# Patient Record
Sex: Female | Born: 1950
Health system: Southern US, Community
[De-identification: ages and names within clinical notes are randomized; demographics above are authoritative.]

## PROBLEM LIST (undated history)

## (undated) DIAGNOSIS — I10 Essential (primary) hypertension: Secondary | ICD-10-CM

## (undated) DIAGNOSIS — G4730 Sleep apnea, unspecified: Secondary | ICD-10-CM

## (undated) DIAGNOSIS — F419 Anxiety disorder, unspecified: Secondary | ICD-10-CM

## (undated) DIAGNOSIS — E785 Hyperlipidemia, unspecified: Secondary | ICD-10-CM

## (undated) HISTORY — DX: Anxiety disorder, unspecified: F41.9

## (undated) HISTORY — DX: Essential (primary) hypertension: I10

## (undated) HISTORY — PX: APPENDECTOMY: SHX54

## (undated) HISTORY — DX: Sleep apnea, unspecified: G47.30

## (undated) HISTORY — DX: Hyperlipidemia, unspecified: E78.5

## (undated) HISTORY — PX: HERNIA REPAIR: SHX51

## (undated) HISTORY — PX: COLON SURGERY: SHX602

## (undated) HISTORY — PX: ABDOMINAL HYSTERECTOMY: SHX81

## (undated) HISTORY — PX: CHOLECYSTECTOMY: SHX55

---

## 1998-02-13 ENCOUNTER — Ambulatory Visit (HOSPITAL_COMMUNITY): Admission: RE | Admit: 1998-02-13 | Discharge: 1998-02-13 | Payer: Self-pay

## 1998-02-15 ENCOUNTER — Ambulatory Visit (HOSPITAL_COMMUNITY): Admission: RE | Admit: 1998-02-15 | Discharge: 1998-02-15 | Payer: Self-pay | Admitting: Family Medicine

## 1998-02-15 ENCOUNTER — Emergency Department (HOSPITAL_COMMUNITY): Admission: EM | Admit: 1998-02-15 | Discharge: 1998-02-15 | Payer: Self-pay | Admitting: Emergency Medicine

## 1998-02-19 ENCOUNTER — Inpatient Hospital Stay (HOSPITAL_COMMUNITY): Admission: EM | Admit: 1998-02-19 | Discharge: 1998-02-21 | Payer: Self-pay | Admitting: Surgery

## 1998-03-08 ENCOUNTER — Ambulatory Visit (HOSPITAL_COMMUNITY): Admission: RE | Admit: 1998-03-08 | Discharge: 1998-03-08 | Payer: Self-pay | Admitting: Surgery

## 1998-03-14 ENCOUNTER — Inpatient Hospital Stay (HOSPITAL_COMMUNITY): Admission: RE | Admit: 1998-03-14 | Discharge: 1998-03-21 | Payer: Self-pay | Admitting: Surgery

## 1998-10-18 ENCOUNTER — Ambulatory Visit (HOSPITAL_COMMUNITY): Admission: RE | Admit: 1998-10-18 | Discharge: 1998-10-18 | Payer: Self-pay | Admitting: Surgery

## 1999-02-12 ENCOUNTER — Emergency Department (HOSPITAL_COMMUNITY): Admission: EM | Admit: 1999-02-12 | Discharge: 1999-02-12 | Payer: Self-pay | Admitting: Emergency Medicine

## 1999-02-14 ENCOUNTER — Encounter: Payer: Self-pay | Admitting: Surgery

## 1999-02-17 ENCOUNTER — Ambulatory Visit (HOSPITAL_COMMUNITY): Admission: RE | Admit: 1999-02-17 | Discharge: 1999-02-17 | Payer: Self-pay | Admitting: Surgery

## 1999-02-20 ENCOUNTER — Inpatient Hospital Stay (HOSPITAL_COMMUNITY): Admission: EM | Admit: 1999-02-20 | Discharge: 1999-02-23 | Payer: Self-pay | Admitting: Surgery

## 2005-08-28 ENCOUNTER — Ambulatory Visit (HOSPITAL_COMMUNITY): Admission: RE | Admit: 2005-08-28 | Discharge: 2005-08-28 | Payer: Self-pay | Admitting: Obstetrics and Gynecology

## 2009-04-29 ENCOUNTER — Encounter: Payer: Self-pay | Admitting: Cardiology

## 2009-05-15 ENCOUNTER — Encounter: Payer: Self-pay | Admitting: Cardiology

## 2009-07-10 ENCOUNTER — Encounter: Payer: Self-pay | Admitting: Cardiology

## 2009-08-06 ENCOUNTER — Ambulatory Visit: Payer: Self-pay | Admitting: Cardiology

## 2009-08-06 DIAGNOSIS — G4730 Sleep apnea, unspecified: Secondary | ICD-10-CM

## 2009-08-06 DIAGNOSIS — I10 Essential (primary) hypertension: Secondary | ICD-10-CM | POA: Insufficient documentation

## 2009-08-06 DIAGNOSIS — R9430 Abnormal result of cardiovascular function study, unspecified: Secondary | ICD-10-CM | POA: Insufficient documentation

## 2009-08-16 ENCOUNTER — Ambulatory Visit (HOSPITAL_BASED_OUTPATIENT_CLINIC_OR_DEPARTMENT_OTHER): Admission: RE | Admit: 2009-08-16 | Discharge: 2009-08-16 | Payer: Self-pay | Admitting: Cardiology

## 2009-08-16 ENCOUNTER — Encounter: Payer: Self-pay | Admitting: Cardiology

## 2009-08-19 ENCOUNTER — Telehealth (INDEPENDENT_AMBULATORY_CARE_PROVIDER_SITE_OTHER): Payer: Self-pay | Admitting: *Deleted

## 2009-08-20 ENCOUNTER — Ambulatory Visit: Payer: Self-pay

## 2009-08-20 ENCOUNTER — Encounter (HOSPITAL_COMMUNITY): Admission: RE | Admit: 2009-08-20 | Discharge: 2009-11-11 | Payer: Self-pay | Admitting: Cardiology

## 2009-08-22 ENCOUNTER — Ambulatory Visit: Payer: Self-pay | Admitting: Pulmonary Disease

## 2009-08-26 ENCOUNTER — Ambulatory Visit: Payer: Self-pay

## 2009-08-26 ENCOUNTER — Ambulatory Visit: Payer: Self-pay | Admitting: Internal Medicine

## 2009-09-10 ENCOUNTER — Telehealth: Payer: Self-pay | Admitting: Cardiology

## 2009-09-30 ENCOUNTER — Ambulatory Visit: Payer: Self-pay | Admitting: Pulmonary Disease

## 2009-10-23 ENCOUNTER — Encounter: Payer: Self-pay | Admitting: Pulmonary Disease

## 2009-11-12 ENCOUNTER — Ambulatory Visit: Payer: Self-pay | Admitting: Pulmonary Disease

## 2009-11-13 ENCOUNTER — Encounter: Payer: Self-pay | Admitting: Pulmonary Disease

## 2010-10-09 NOTE — Assessment & Plan Note (Signed)
Summary: rov 6 wks ///kp   Visit Type:  Follow-up Copy to:  Dr. Antoine Poche Primary Provider/Referring Provider:  Dr. Vernon Prey  CC:  Pt here for follow up. Pt states has problems adjusting to CPAP.  Pt c/o dry mouth, hacking cough with CPAP. Pt states snoring is not as loud as before, and is feeling rested whan waking up in the AM's, and and is using same approx 4 hours a night.  History of Present Illness: 60/F , retired Comptroller, referred for evaluation of obstructive sleep apnea . Epworth Sleepiness Score 12/24 mother reports loud  snoring. Bedtime 10-11 pm, no latency, sleeps on her side with 2 pillows frequent arousals 5-10, light sleeper, BR 1-2 Wakes up at 6A, sometimes not rested, dry mouth, no headaches,no  coffee, not much tea,  Enrolled in weight watchers, lost 30 lbs since 9/10  Overnight split PSG 08/16/09 showed moderate sleep disorderde breathing with AHI 16/h & nadir desatn to 88%, worse in supine position, predom hypopneas,  corrected by CPAP 10 cm , titrated to 14 cm for snoring.  November 12, 2009 2:47 PM  Had bronchitis x 3 weeks. wt 313 Pt states has problems adjusting to CPAP.  Pt c/o dry mouth, hacking cough with CPAP. Pt states snoring is not as loud as before, and is feeling rested whan waking up in the AM's, and and is using same approx 4 hours a night. Mother noted no snoring. Dry mouth inspite of a humidifer.  Current Medications (verified): 1)  Vitamin D3 2000 Unit Caps (Cholecalciferol) .Rhonda Daniel.. 1 Tab By Mouth Once Daily 2)  Simcor 1000-40 Mg Xr24h-Tab (Niacin-Simvastatin) .... Take 1 Tab By Mouth At Bedtime 3)  Lisinopril-Hydrochlorothiazide 20-12.5 Mg Tabs (Lisinopril-Hydrochlorothiazide) .Rhonda Daniel.. 1 Tab By Mouth Once Daily 4)  Aspirin 81 Mg  Tabs (Aspirin) .... Take 1 Tablet By Mouth Once A Day  Allergies (verified): No Known Drug Allergies  Past History:  Past Medical History: Last updated: 08/06/2009 HTN (recent) Dyslipidemia (Meds x several years)  Social  History: Last updated: 09/30/2009 The patient is to retire Chartered loss adjuster. She is not married and has no children. She has never smoked cigarettes and doesn't drink alcohol. lives with mother  Review of Systems  The patient denies anorexia, fever, weight loss, weight gain, vision loss, decreased hearing, hoarseness, chest pain, syncope, dyspnea on exertion, peripheral edema, prolonged cough, headaches, hemoptysis, abdominal pain, melena, hematochezia, severe indigestion/heartburn, hematuria, muscle weakness, difficulty walking, depression, unusual weight change, and abnormal bleeding.    Vital Signs:  Patient profile:   60 year old female Height:      65 inches Weight:      313.13 pounds O2 Sat:      96 % on Room air Temp:     97.4 degrees F oral Pulse rate:   54 / minute BP sitting:   122 / 78  (right arm) Cuff size:   large  Vitals Entered By: Zackery Barefoot CMA (November 12, 2009 2:32 PM)  O2 Flow:  Room air CC: Pt here for follow up. Pt states has problems adjusting to CPAP.  Pt c/o dry mouth, hacking cough with CPAP. Pt states snoring is not as loud as before, and is feeling rested whan waking up in the AM's, and is using same approx 4 hours a night Comments Medications reviewed with patient Verified contact number and pharmacy with patient Zackery Barefoot CMA  November 12, 2009 2:35 PM    Physical Exam  Additional Exam:  Gen. Pleasant,  well-nourished, in no distress, normal affect ENT - no lesions, no post nasal drip, class 3 aiway Neck: No JVD, no thyromegaly, no carotid bruits Lungs: no use of accessory muscles, no dullness to percussion, clear without rales or rhonchi  Cardiovascular: Rhythm regular, heart sounds  normal, no murmurs or gallops, no peripheral edema       Impression & Recommendations:  Problem # 1:  SLEEP APNEA (ICD-780.57) ct CPAP 10 cm Compliance encouraged, wt loss emphasized, asked to avoid meds with sedative side effects, cautioned against driving  when sleepy.  Await download  Medications Added to Medication List This Visit: 1)  Simcor 1000-40 Mg Xr24h-tab (Niacin-simvastatin) .... Take 1 tab by mouth at bedtime 2)  Aspirin 81 Mg Tabs (Aspirin) .... Take 1 tablet by mouth once a day 3)  Cpap 10 Cm   Patient Instructions: 1)  Copy sent to: 2)  Please schedule a follow-up appointment in 4 months. 3)  Keep using your machine 4-5 hrs/ night 4)  Send in the chip so we can look at the download    Appended Document: rov 6 wks ///kp reviewed download 1/28- 3/9 >> 50% compliance, 20/21 days, avg AHI 8/h, pr 10 cm Let her know - she needs to increase usage to at least 4h EVERY night  Appended Document: rov 6 wks ///kp Pt informed of RA recs. Pt states she has another head cold but will try to use as directed.  Appended Document: rov 6 wks ///kp     Allergies: No Known Drug Allergies   Other Orders: Est. Patient Level III (53664)

## 2010-10-09 NOTE — Assessment & Plan Note (Signed)
Summary: sleep apnea/apc   Visit Type:  Initial Consult Copy to:  Dr. Antoine Poche Primary Provider/Referring Provider:  Dr. 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 Epworth Sleepiness Score 12/24 mother reports loud  snoring. Bedtime 10-11 pm, no latency, sleeps on her side with 2 pillows frequent arousals 5-10, light sleeper, BR 1-2 Wakes up at 6A, sometimes not rested, dry mouth, no headaches,no  coffee, not much tea,  Enrolled in weight watchers, lost 30 lbs since 9/10  There is no history suggestive of cataplexy, sleep paralysis or parasomnias  Overnight split PSG 08/16/09 showed moderate sleep disorderde breathing with AHI 16/h & nadir desatn to 88%, worse in supine position, predom hypopneas,  corrected by CPAP 10 cm , titrated to 14 cm for snoring.  Preventive Screening-Counseling & Management  Alcohol-Tobacco     Smoking Status: never   History of Present Illness: Snoring, getting up during night, tiredness, irregular heart rhythm, overweight  What time do you typically go to bed?(between what hours): 10:00-11:00  How long does it take you to fall asleep? 5-10 minutes  How many times during the night do you wake up? 5-10 times  What time do you get out of bed to start your day? 5:00-6:00  Do you drive or operate heavy machinery in your occupation? no   Have you ever had a sleep study before?  If yes,when and where: 08-16-2009  Do you currently use CPAP ? If so , at what pressure? no  Do you wear oxygen at any time? If yes, how many liters per minute? no Current Medications (verified): 1)  Vitamin D3 2000 Unit Caps (Cholecalciferol) .Marland Kitchen.. 1 Tab By Mouth Once Daily 2)  Advicor 500-20 Mg Xr24h-Tab (Niacin-Lovastatin) .Marland Kitchen.. 1 Tab By Mouth Once Daily 3)  Lisinopril-Hydrochlorothiazide 20-12.5 Mg Tabs (Lisinopril-Hydrochlorothiazide) .Marland Kitchen.. 1 Tab By Mouth Once  Daily  Allergies (verified): No Known Drug Allergies  Past History:  Past Medical History: Last updated: 08/06/2009 HTN (recent) Dyslipidemia (Meds x several years)  Past Surgical History: Last updated: 08/06/2009 Hysterectomy Appendectomy (with removal of a small part of her colon with an apparent malignancy) Cholecystectomy Herniorrhaphy  Family History: Last updated: 09/30/2009 There is no early heart disease in first-degree relatives. Family History Diabetes-mother Family History Hypertension-mother  Social History: Last updated: 09/30/2009 The patient is to retire Chartered loss adjuster. She is not married and has no children. She has never smoked cigarettes and doesn't drink alcohol. lives with mother  Family History: There is no early heart disease in first-degree relatives. Family History Diabetes-mother Family History Hypertension-mother  Social History: The patient is to retire Chartered loss adjuster. She is not married and has no children. She has never smoked cigarettes and doesn't drink alcohol. lives with motherSmoking Status:  never  Review of Systems       The patient complains of shortness of breath with activity, non-productive cough, irregular heartbeats, and joint stiffness or pain.  The patient denies shortness of breath at rest, productive cough, coughing up blood, chest pain, acid heartburn, indigestion, loss of appetite, weight change, abdominal pain, difficulty swallowing, sore throat, tooth/dental problems, headaches, nasal congestion/difficulty breathing through nose, sneezing, itching, ear ache, anxiety, depression, hand/feet swelling, rash, change in color of mucus, and fever.    Vital Signs:  Patient profile:   60 year old female Height:      65 inches Weight:      319.50 pounds O2  Sat:      98 % on Room air Temp:     97.4 degrees F oral Pulse rate:   63 / minute BP sitting:   122 / 78  (left arm) Cuff size:   large  Vitals Entered By: Zackery Barefoot CMA (September 30, 2009 2:03 PM)  O2 Flow:  Room air CC: Pt here for sleep consult. Comments Medications reviewed with patient Verified pt's contact number Zackery Barefoot CMA  September 30, 2009 2:04 PM    Physical Exam  Additional Exam:  Gen. Pleasant, well-nourished, in no distress, normal affect ENT - no lesions, no post nasal drip, class 3 aiway Neck: No JVD, no thyromegaly, no carotid bruits Lungs: no use of accessory muscles, no dullness to percussion, clear without rales or rhonchi  Cardiovascular: Rhythm regular, heart sounds  normal, no murmurs or gallops, no peripheral edema Abdomen: soft and non-tender, no hepatosplenomegaly, BS normal. Musculoskeletal: No deformities, no cyanosis or clubbing Neuro:  alert, non focal     Impression & Recommendations:  Problem # 1:  SLEEP APNEA (ICD-780.57) The pathophysiology of obstructive sleep apnea, it's cardiovascular consequences and modes of treatment including CPAP were discussed with the patient in great detail.  Will start CPAP at 10 cm with small full face mask Compliance encouraged, wt loss emphasized, asked to avoid meds with sedative side effects, cautioned against driving when sleepy.  Orders: Est. Patient Level III (95621) Sleep Disorder Referral (Sleep Disorder)  Patient Instructions: 1)  Copy sent to:Dr Hochrein 2)  Please schedule a follow-up appointment in 1 month. 3)  trial of CPAP machine during sleep

## 2010-10-09 NOTE — Letter (Signed)
Summary: CMN for CPAP Supplies/[facility] [employer]  CMN for CPAP Supplies/[employer]   Imported By: Sherian Rein 10/29/2009 08:42:33  _____________________________________________________________________  External Attachment:    Type:   Image     Comment:   External Document

## 2010-10-09 NOTE — Progress Notes (Signed)
Summary: SLEEP STUDY RESULTS ?pulmonary referral  Phone Note Call from Patient Call back at Home Phone [phone]   Caller: Patient Reason for Call: Talk to Nurse Summary of Call: PT REQUEST RESULTS OF SLEEP STUDY Initial call taken by: Migdalia Dk,  September 10, 2009 4:28 PM  Follow-up for Phone Call        referred to pulmonary for abnormal sleep study per Dr Antoine Poche. Follow-up by: Charolotte Capuchin, RN,  September 12, 2009 2:31 PM

## 2011-01-23 NOTE — Op Note (Signed)
NAME:  Daniel, Rhonda                  ACCOUNT NO.:  [id number]   MEDICAL RECORD NO.:  [id number]          PATIENT TYPE:  AMB   LOCATION:  ENDO                         FACILITY:  [facility]   PHYSICIAN:  Sandria Bales. Ezzard Standing, M.D.  DATE OF BIRTH:  1951-04-24   DATE OF PROCEDURE:  08/28/2005  DATE OF DISCHARGE:                                 OPERATIVE REPORT   PREOPERATIVE DIAGNOSES:  History of adenocarcinoma of the appendix.   POSTOPERATIVE DIAGNOSES:  Sigmoid colon diverticulosis, redundant sigmoid  colon, normal anastomosis.   PROCEDURE:  Colonoscopy.   SURGEON:  Sandria Bales. Ezzard Standing, M.D.   ANESTHESIA:  90 mg of Demerol, 8 mg of Versed.   COMPLICATIONS:  None.   INDICATIONS FOR PROCEDURE:  Ms. Blasdell is a 60 year old white female who had  undergone a right hemicolectomy for carcinoma of the appendix in July of  1999. She has had no evidence of recurrent disease, her last colonoscopy was  June of 2000 and she now comes for followup colonoscopy.   The indications and potential complications of the colonoscopy were  explained to the patient. The potential complications include but not  limited to bleeding, perforation and possible incomplete colonoscopy.   DESCRIPTION OF PROCEDURE:  The patient in the left lateral decubitus  position, had an IV in her right hand. I started out with 50 mg of Demerol,  5 mg of Versed but she had an additional 40 mg of Demerol, 3 mg of Versed  for a total of 90 mg of Demerol and 8 mg of Versed.  She was monitored with  pulxe oximetry, EKG, and BP cuff.  She was on nasal O2 during the operation.   I was able to advance the scope until I got through the sigmoid colon. I  could see the lumen of the left colon; however, on advancing the  colonoscope, I got a lot of  bowing of the scope in the sigmoid colon. At  one point actually in the procedure, she got sick and vomited but she was on  her side and she really did not vomit very much. She had more of a feeling  of  nausea.   I then laid her in the supine position and had no more luck advancing her  scope and then I rolled her to her right lateral decubitus position. At this  time, with pressure on the sigmoid colon, I was able to advance the scope  around to the transverse colon to the anastomosis.   This took up the entire colonoscope approximately 130 cm from the anal  verge. Again, I had the entire colonoscope in and could really not advance  any further. I identified the anastomosis at the rigth transverse colon.  The transverse colon and the left colon were unremarkable. The sigmoid colon  had some scattered diverticula and again was very redundant and because of  her size it was somewhat hard to feel until you really had the sigmoid colon  bowed.   However, other than seeing some contusion of the wall in the sigmoid colon  and the  diverticulosis, the sigmoid colon was otherwise unremarkable into  the rectum. I retroflexed the scope and saw some internal hemorrhoids.   So, she had no obvious mucosal lesions, she had a normal anastomosis at the  full colonoscopic length. She had some scattered sigmoid diverticulosis, she  had a redundant sigmoid colon and some internal hemorrhoids. The next  planned colonoscopy would be in 5 years.      Sandria Bales. Ezzard Standing, M.D.  Electronically Signed     DHN/MEDQ  D:  08/28/2005  T:  09/01/2005  Job:  [id number]   cc:   Ernestina Penna, M.D.  Fax: [phone]   Lindaann Pascal, MD  [facility]

## 2013-03-06 ENCOUNTER — Other Ambulatory Visit: Payer: Self-pay

## 2013-03-06 NOTE — Telephone Encounter (Signed)
Pt has not been seen in greater than 1 yr  NTBS and have labs

## 2013-03-06 NOTE — Telephone Encounter (Signed)
Last lipids 6/13 and last seen 02/22/12    [facility]

## 2013-03-14 ENCOUNTER — Other Ambulatory Visit: Payer: Self-pay | Admitting: *Deleted

## 2013-03-14 ENCOUNTER — Ambulatory Visit: Payer: Self-pay | Admitting: Family Medicine

## 2013-03-14 NOTE — Telephone Encounter (Signed)
[facility] pt, last labs 06/13

## 2013-03-14 NOTE — Telephone Encounter (Signed)
Patient NTBS by Dr. Christell Constant

## 2013-04-15 ENCOUNTER — Other Ambulatory Visit: Payer: Self-pay | Admitting: Nurse Practitioner

## 2013-04-15 ENCOUNTER — Other Ambulatory Visit: Payer: Self-pay | Admitting: Family Medicine

## 2013-04-18 NOTE — Telephone Encounter (Signed)
Last seen 6/13  [facility] 

## 2013-04-18 NOTE — Telephone Encounter (Signed)
Lipids done 2/14   [facility]

## 2013-05-15 ENCOUNTER — Other Ambulatory Visit: Payer: Self-pay | Admitting: Family Medicine

## 2013-05-17 NOTE — Telephone Encounter (Signed)
Please call patient and arrange that she comes in for an exam and lab work

## 2013-05-17 NOTE — Telephone Encounter (Signed)
Last seen and last lipids 6/13  [facility]

## 2014-07-09 ENCOUNTER — Telehealth: Payer: Self-pay | Admitting: Family Medicine

## 2014-07-09 NOTE — Telephone Encounter (Signed)
Spoke with patient and appointment scheduled for tomorrow with Christell ConstantMoore at 10:30

## 2014-07-09 NOTE — Telephone Encounter (Signed)
Patient aware will need an appointment to be seen. Has not been seen here for a very long time. [facility] agrees ,NTBS. She is waiting for triage to call with an appointment, ( don't think it has to be today).

## 2014-07-10 ENCOUNTER — Ambulatory Visit (INDEPENDENT_AMBULATORY_CARE_PROVIDER_SITE_OTHER): Payer: BC Managed Care – PPO

## 2014-07-10 ENCOUNTER — Ambulatory Visit (INDEPENDENT_AMBULATORY_CARE_PROVIDER_SITE_OTHER): Payer: BC Managed Care – PPO | Admitting: Family Medicine

## 2014-07-10 ENCOUNTER — Other Ambulatory Visit: Payer: Self-pay | Admitting: Family Medicine

## 2014-07-10 ENCOUNTER — Encounter: Payer: Self-pay | Admitting: Family Medicine

## 2014-07-10 VITALS — BP 165/89 | HR 73 | Temp 97.1°F | Ht 65.0 in | Wt 325.0 lb

## 2014-07-10 DIAGNOSIS — M25559 Pain in unspecified hip: Secondary | ICD-10-CM

## 2014-07-10 DIAGNOSIS — M5136 Other intervertebral disc degeneration, lumbar region: Secondary | ICD-10-CM

## 2014-07-10 DIAGNOSIS — W19XXXA Unspecified fall, initial encounter: Secondary | ICD-10-CM

## 2014-07-10 DIAGNOSIS — M25551 Pain in right hip: Secondary | ICD-10-CM

## 2014-07-10 DIAGNOSIS — S7002XA Contusion of left hip, initial encounter: Secondary | ICD-10-CM

## 2014-07-10 DIAGNOSIS — R0602 Shortness of breath: Secondary | ICD-10-CM

## 2014-07-10 DIAGNOSIS — M25552 Pain in left hip: Secondary | ICD-10-CM

## 2014-07-10 DIAGNOSIS — I10 Essential (primary) hypertension: Secondary | ICD-10-CM

## 2014-07-10 DIAGNOSIS — E785 Hyperlipidemia, unspecified: Secondary | ICD-10-CM

## 2014-07-10 MED ORDER — LISINOPRIL-HYDROCHLOROTHIAZIDE 20-12.5 MG PO TABS: ORAL_TABLET | ORAL | Status: DC

## 2014-07-10 NOTE — Progress Notes (Signed)
Subjective:    Patient ID: Rhonda Daniel, female    DOB: 1951-07-07, 62 y.o.   MRN: [id number]  HPI Patient here today for a fall that happened on the 07/05/14. She is having right side hip pain and back pain.the patient has also stopped taking her cholesterol medicine and her blood pressure medicine.the patient complains of pain in the right lateral hip area and shortness of breath and some nausea.       Patient Active Problem List   Diagnosis Date Noted  . OBESITY, UNSPECIFIED 08/06/2009  . ESSENTIAL HYPERTENSION, BENIGN 08/06/2009  . SLEEP APNEA 08/06/2009  . CARDIOVASCULAR FUNCTION STUDY, ABNORMAL 08/06/2009   Outpatient Encounter Prescriptions as of 07/10/2014  Medication Sig  . lisinopril-hydrochlorothiazide (PRINZIDE,ZESTORETIC) 20-12.5 MG per tablet TAKE 1 TABLET BY MOUTH DAILY  . SIMCOR 1000-40 MG TB24 TAKE 1 TABLET BY MOUTH EVERY DAY    Review of Systems  Constitutional: Negative.   HENT: Negative.   Eyes: Negative.   Respiratory: Negative.   Cardiovascular: Negative.   Gastrointestinal: Negative.   Endocrine: Negative.   Genitourinary: Negative.   Musculoskeletal: Positive for back pain and arthralgias (right hip pain).  Skin: Negative.   Allergic/Immunologic: Negative.   Neurological: Negative.   Hematological: Negative.   Psychiatric/Behavioral: Negative.        Objective:   Physical Exam  Constitutional: She is oriented to person, place, and time. She appears well-developed and well-nourished. She appears distressed.  The patient comes to the visit today with a friend and the patient is in a wheelchair.  HENT:  Right Ear: External ear normal.  Left Ear: External ear normal.  Nose: Nose normal.  Mouth/Throat: Oropharynx is clear and moist.  Eyes: Conjunctivae and EOM are normal. Pupils are equal, round, and reactive to light. Right eye exhibits no discharge. Left eye exhibits no discharge. No scleral icterus.  Neck: Normal range of motion. Neck  supple. No thyromegaly present.  Cardiovascular: Normal rate, regular rhythm and normal heart sounds.   No murmur heard. At 72/m  Pulmonary/Chest: Effort normal and breath sounds normal. No respiratory distress. She has no wheezes. She has no rales. She exhibits no tenderness.  Abdominal: Soft. There is no tenderness.  Morbidly obese and not thoroughly evaluated today  Musculoskeletal: Normal range of motion. She exhibits edema. She exhibits no tenderness.  Tender in the right sacroiliac area. Edema on the left was greater than the right 1+ pitting  Lymphadenopathy:    She has no cervical adenopathy.  Neurological: She is alert and oriented to person, place, and time. No cranial nerve deficit.  Skin: Skin is warm and dry. No rash noted. No erythema. No pallor.  Psychiatric: She has a normal mood and affect. Her behavior is normal. Judgment and thought content normal.  Nursing note and vitals reviewed.  BP 165/89 mmHg  Pulse 73  Temp(Src) 97.1 F (36.2 C) (Oral)  Ht _0  (1.651 m)  Wt 325 lb (147.419 kg)  BMI 54.08 kg/m2 [facility] reading (PRIMARY) by  Dr.Scotti Motter- chest x-ray, LS-spine, and bilateral hips--the chest x-ray shows atherosclerotic changes in the thoracic aorta, degenerative changes in the LS-spine, and degenerative changes in both hips                                         Assessment & Plan:  1. Hip pain, left  2. Hip pain, right  3. Essential  hypertension - Lipid panel - Hepatic function panel - BMP8+EGFR  4. Morbid obesity  5. Hyperlipidemia  6. Degenerative disc disease, lumbar  7. Contusion of left hip, initial encounter   Meds ordered this encounter  Medications  . aspirin 81 MG tablet    Sig: Take 81 mg by mouth daily.  . cholecalciferol (VITAMIN D) 1000 UNITS tablet    Sig: Take 1,000 Units by mouth daily.  Marland Kitchen lisinopril-hydrochlorothiazide (PRINZIDE,ZESTORETIC) 20-12.5 MG per tablet    Sig: TAKE 1 TABLET BY MOUTH DAILY    Dispense:  30 tablet     Refill:  3   Patient Instructions  Use walker at home Use ice over the areas that are hurting 20 minutes 3 or 4 times daily Take extra strength Tylenol as needed for pain If worse call back sooner Restart blood pressure medication We will wait until Friday to discuss cholesterol medicine after looking at lab work that was drawn today You must do better with taking your medication and taking better care of yourself   Arrie Senate MD

## 2014-07-10 NOTE — Patient Instructions (Signed)
Use walker at home Use ice over the areas that are hurting 20 minutes 3 or 4 times daily Take extra strength Tylenol as needed for pain If worse call back sooner Restart blood pressure medication We will wait until Friday to discuss cholesterol medicine after looking at lab work that was drawn today You must do better with taking your medication and taking better care of yourself

## 2014-07-11 LAB — BMP8+EGFR
BUN / CREAT RATIO: 23 (ref 11–26)
BUN: 15 mg/dL (ref 8–27)
CO2: 26 mmol/L (ref 18–29)
CREATININE: 0.64 mg/dL (ref 0.57–1.00)
Calcium: 9 mg/dL (ref 8.7–10.3)
Chloride: 100 mmol/L (ref 97–108)
GFR calc Af Amer: 109 mL/min/{1.73_m2} (ref >59)
GFR calc non Af Amer: 95 mL/min/{1.73_m2} (ref >59)
Glucose: 86 mg/dL (ref 65–99)
Potassium: 3.6 mmol/L (ref 3.5–5.2)
Sodium: 142 mmol/L (ref 134–144)

## 2014-07-11 LAB — LIPID PANEL
CHOLESTEROL TOTAL: 198 mg/dL (ref 100–199)
Chol/HDL Ratio: 4.4 ratio units (ref 0.0–4.4)
HDL: 45 mg/dL (ref >39)
LDL CALC: 134 mg/dL — AB (ref 0–99)
Triglycerides: 93 mg/dL (ref 0–149)
VLDL CHOLESTEROL CAL: 19 mg/dL (ref 5–40)

## 2014-07-11 LAB — HEPATIC FUNCTION PANEL
ALBUMIN: 4 g/dL (ref 3.6–4.8)
ALT: 18 IU/L (ref 0–32)
AST: 17 IU/L (ref 0–40)
Alkaline Phosphatase: 63 IU/L (ref 39–117)
Bilirubin, Direct: 0.16 mg/dL (ref 0.00–0.40)
Total Bilirubin: 0.6 mg/dL (ref 0.0–1.2)
Total Protein: 6.4 g/dL (ref 6.0–8.5)

## 2014-07-12 ENCOUNTER — Telehealth: Payer: Self-pay | Admitting: *Deleted

## 2014-07-12 NOTE — Telephone Encounter (Signed)
-----   Message from Donald W Moore, MD sent at 07/11/2014 10:03 AM EST ----- °Carl a traditional lipid panel the total cholesterol is 198. The triglycerides are good at 93. The LDL C is elevated at 134. The LDL C is the most important number and this number should be less than 100.we will discuss treatment at your next visit. Continue with as aggressive therapeutic lifestyle changes as possible. °All liver function tests are within normal limits °The blood sugar, kidney function tests including creatinine and potassium are all within normal limits °

## 2014-07-13 ENCOUNTER — Encounter: Payer: Self-pay | Admitting: Family Medicine

## 2014-07-13 ENCOUNTER — Ambulatory Visit (INDEPENDENT_AMBULATORY_CARE_PROVIDER_SITE_OTHER): Payer: BC Managed Care – PPO | Admitting: Family Medicine

## 2014-07-13 VITALS — BP 122/73 | HR 73 | Temp 97.1°F | Ht 65.0 in | Wt 325.0 lb

## 2014-07-13 DIAGNOSIS — I10 Essential (primary) hypertension: Secondary | ICD-10-CM

## 2014-07-13 DIAGNOSIS — S7002XA Contusion of left hip, initial encounter: Secondary | ICD-10-CM

## 2014-07-13 DIAGNOSIS — E559 Vitamin D deficiency, unspecified: Secondary | ICD-10-CM

## 2014-07-13 DIAGNOSIS — E785 Hyperlipidemia, unspecified: Secondary | ICD-10-CM

## 2014-07-13 DIAGNOSIS — M25551 Pain in right hip: Secondary | ICD-10-CM

## 2014-07-13 DIAGNOSIS — S7002XD Contusion of left hip, subsequent encounter: Secondary | ICD-10-CM

## 2014-07-13 MED ORDER — SIMVASTATIN 20 MG PO TABS: 20.0000 mg | ORAL_TABLET | Freq: Every day | ORAL | Status: DC

## 2014-07-13 NOTE — Patient Instructions (Signed)
Please call us in about a week to 10 days and let us be aware of your progress and how you're feeling Continue to take her blood pressure medicine regularly Continue to take Tylenol as needed for severe pain Continue to use her walker around the house for the next 7-10 days on an as-needed basis Do not do any climbing Please be more careful with your movement so you did not fall again

## 2014-07-13 NOTE — Progress Notes (Signed)
Subjective:    Patient ID: Rhonda Daniel, female    DOB: 08-21-1951, 63 y.o.   MRN: [id number]  HPI Patient here today for 3 day follow up from a fall. She states she is feeling much better.the patient has restarted her blood pressure medicine and her blood pressure reading today is much better.recent labs and x-rays will be reviewed with her today. The patient comes with her friend today has been helping her at home overcoming this fall. The patient is alert and appears to be in much better spirits.        Patient Active Problem List   Diagnosis Date Noted  . OBESITY, UNSPECIFIED 08/06/2009  . ESSENTIAL HYPERTENSION, BENIGN 08/06/2009  . SLEEP APNEA 08/06/2009  . CARDIOVASCULAR FUNCTION STUDY, ABNORMAL 08/06/2009   Outpatient Encounter Prescriptions as of 07/13/2014  Medication Sig  . aspirin 81 MG tablet Take 81 mg by mouth daily.  . cholecalciferol (VITAMIN D) 1000 UNITS tablet Take 1,000 Units by mouth daily.  Marland Kitchen lisinopril-hydrochlorothiazide (PRINZIDE,ZESTORETIC) 20-12.5 MG per tablet TAKE 1 TABLET BY MOUTH DAILY  . SIMCOR 1000-40 MG TB24 TAKE 1 TABLET BY MOUTH EVERY DAY    Review of Systems  Constitutional: Negative.   HENT: Negative.   Eyes: Negative.   Respiratory: Negative.   Cardiovascular: Negative.   Gastrointestinal: Negative.   Endocrine: Negative.   Genitourinary: Negative.   Musculoskeletal: Positive for myalgias.  Skin: Negative.   Allergic/Immunologic: Negative.   Neurological: Negative.   Hematological: Negative.   Psychiatric/Behavioral: Negative.        Objective:   Physical Exam  Constitutional: She is oriented to person, place, and time. She appears well-developed and well-nourished. No distress.  HENT:  Head: Normocephalic.  Eyes: Conjunctivae and EOM are normal. Pupils are equal, round, and reactive to light. Right eye exhibits no discharge. Left eye exhibits no discharge. No scleral icterus.  Neck: Normal range of motion.    Cardiovascular: Normal rate, regular rhythm, normal heart sounds and intact distal pulses.   No murmur heard. Pulmonary/Chest: Effort normal and breath sounds normal. No respiratory distress. She has no wheezes. She has no rales. She exhibits no tenderness.  Abdominal: Soft. Bowel sounds are normal. She exhibits no mass. There is no tenderness. There is no rebound and no guarding.  Morbid obesity without tenderness or masses  Musculoskeletal: Normal range of motion. She exhibits edema (1+ edema of theleft leg). She exhibits no tenderness.  Fairly good mobility with leg raising and hip abduction bilaterally. There is still some tenderness of the right lateral and posterior hip areas. There is also some tenderness in the right sacroiliac area.  Neurological: She is alert and oriented to person, place, and time. She has normal reflexes. No cranial nerve deficit.  Skin: Skin is warm and dry. No rash noted.  The contusion of her left hip appears to be spreading and progressing as expected  Psychiatric: She has a normal mood and affect. Her behavior is normal. Judgment and thought content normal.  Nursing note and vitals reviewed.  BP 122/73 mmHg  Pulse 73  Temp(Src) 97.1 F (36.2 C) (Oral)  Ht '5\' 5"'  (1.651 m)  Wt 325 lb (147.419 kg)  BMI 54.08 kg/m2        Assessment & Plan:  1. Essential hypertension - POCT CBC; Future - BMP8+EGFR; Future - Hepatic function panel; Future  2. Hyperlipidemia - POCT CBC; Future - NMR, lipoprofile; Future  3. Vitamin D deficiency - Vit D  25 hydroxy (rtn osteoporosis  monitoring); Future  4. Contusion of left hip, initial encounter -use warm wet compresses 20 minutes 3 or 4 times daily  5. Hip pain, right -use warm wet compresses 3 or 4 times daily  Patient Instructions  Please call us in about a week to 10 days and let us be aware of your progress and how you're feeling Continue to take her blood pressure medicine regularly Continue to take  Tylenol as needed for severe pain Continue to use her walker around the house for the next 7-10 days on an as-needed basis Do not do any climbing Please be more careful with your movement so you did not fall again   Arrie Senate MD

## 2014-07-16 NOTE — Telephone Encounter (Signed)
-----   Message from Ernestina Pennaonald W Moore, MD sent at 07/11/2014 10:03 AM EST ----- Baldo Asharl a traditional lipid panel the total cholesterol is 198. The triglycerides are good at 93. The LDL C is elevated at 134. The LDL C is the most important number and this number should be less than 100.we will discuss treatment at your next visit. Continue with as aggressive therapeutic lifestyle changes as possible. All liver function tests are within normal limits The blood sugar, kidney function tests including creatinine and potassium are all within normal limits

## 2014-07-23 ENCOUNTER — Telehealth: Payer: Self-pay | Admitting: Family Medicine

## 2014-07-23 NOTE — Telephone Encounter (Signed)
Patient phone call is noted.

## 2014-08-01 ENCOUNTER — Encounter: Payer: Self-pay | Admitting: Family Medicine

## 2014-08-01 ENCOUNTER — Ambulatory Visit (INDEPENDENT_AMBULATORY_CARE_PROVIDER_SITE_OTHER): Payer: BC Managed Care – PPO | Admitting: Family Medicine

## 2014-08-01 VITALS — BP 120/81 | HR 73 | Temp 97.1°F | Ht 65.0 in | Wt 325.0 lb

## 2014-08-01 DIAGNOSIS — M545 Low back pain: Secondary | ICD-10-CM

## 2014-08-01 DIAGNOSIS — I10 Essential (primary) hypertension: Secondary | ICD-10-CM

## 2014-08-01 NOTE — Progress Notes (Signed)
   Subjective:    Patient ID: Rhonda Daniel, female    DOB: 1950/09/12, 63 y.o.   MRN: [id number]  HPI  Pt is here for 3 week follow up on her hip contusion after a fall. The patient is using a cane and is pleasant during the visit today. She indicates that she is still having problems and her right low back and it is especially noticeable at nighttime when she is in the bed. She is not having any hip issues.   Patient Active Problem List   Diagnosis Date Noted  . OBESITY, UNSPECIFIED 08/06/2009  . ESSENTIAL HYPERTENSION, BENIGN 08/06/2009  . SLEEP APNEA 08/06/2009  . CARDIOVASCULAR FUNCTION STUDY, ABNORMAL 08/06/2009   Outpatient Encounter Prescriptions as of 08/01/2014  Medication Sig  . aspirin 81 MG tablet Take 81 mg by mouth daily.  . cholecalciferol (VITAMIN D) 1000 UNITS tablet Take 1,000 Units by mouth daily.  Marland Kitchen. lisinopril-hydrochlorothiazide (PRINZIDE,ZESTORETIC) 20-12.5 MG per tablet TAKE 1 TABLET BY MOUTH DAILY  . simvastatin (ZOCOR) 20 MG tablet Take 1 tablet (20 mg total) by mouth at bedtime.    Review of Systems  Constitutional: Negative.   HENT: Negative.   Eyes: Negative.   Respiratory: Negative.   Cardiovascular: Negative.   Gastrointestinal: Negative.   Endocrine: Negative.   Genitourinary: Negative.   Musculoskeletal: Positive for back pain.       Back still hurts and having trouble sleeping due to getting comfortable in the bed.  Skin: Negative.   Allergic/Immunologic: Negative.   Neurological: Negative.   Hematological: Negative.   Psychiatric/Behavioral: Negative.        Objective:   Physical Exam  Constitutional: She is oriented to person, place, and time. She appears well-developed and well-nourished. No distress.  HENT:  Head: Normocephalic.  Eyes: Conjunctivae and EOM are normal. Pupils are equal, round, and reactive to light. Right eye exhibits no discharge. Left eye exhibits no discharge. No scleral icterus.  Neck: Normal range of motion.    Musculoskeletal: She exhibits tenderness.  There is persistent pain and tenderness in the right LS spine area. There is no tenderness to palpation of either hip. The bruising from the fall has completely resolved. Range of motion is somewhat limited due to discomfort and pain in back.  Neurological: She is alert and oriented to person, place, and time.  Skin: Skin is warm and dry. No rash noted.  Psychiatric: She has a normal mood and affect. Her behavior is normal. Judgment and thought content normal.    BP 120/81 mmHg  Pulse 73  Temp(Src) 97.1 F (36.2 C) (Oral)  Ht 5\' 5"  (1.651 m)         Assessment & Plan:  1. Right low back pain, with sciatica presence unspecified - MR Lumbar Spine Wo Contrast; Future  2. Essential hypertension -Continue current medication  Patient Instructions  Continue to use warm wet compresses Continue to take blood pressure medication Start simvastatin as directed Check liver function tests in 4 weeks We will arrange for you to get an MRI of the LS spine because of the continued low back pain following the fall that you have had    Nyra Capeson W. Gradyn Shein MD

## 2014-08-01 NOTE — Patient Instructions (Signed)
Continue to use warm wet compresses Continue to take blood pressure medication Start simvastatin as directed Check liver function tests in 4 weeks We will arrange for you to get an MRI of the LS spine because of the continued low back pain following the fall that you have had

## 2014-08-17 ENCOUNTER — Ambulatory Visit
Admission: RE | Admit: 2014-08-17 | Discharge: 2014-08-17 | Disposition: A | Discharge status: Home / Self Care | Payer: BC Managed Care – PPO | Source: Ambulatory Visit | Attending: Family Medicine | Admitting: Family Medicine

## 2014-08-17 ENCOUNTER — Ambulatory Visit: Payer: BC Managed Care – PPO | Admitting: Family Medicine

## 2014-08-17 DIAGNOSIS — M545 Low back pain: Secondary | ICD-10-CM

## 2014-08-23 ENCOUNTER — Encounter (INDEPENDENT_AMBULATORY_CARE_PROVIDER_SITE_OTHER): Payer: BC Managed Care – PPO | Admitting: Family Medicine

## 2014-08-23 ENCOUNTER — Encounter: Payer: Self-pay | Admitting: Family Medicine

## 2014-08-23 DIAGNOSIS — M479 Spondylosis, unspecified: Secondary | ICD-10-CM

## 2014-08-23 NOTE — Progress Notes (Deleted)
   Subjective:    Patient ID: Rhonda PettiesNancy E Haver, female    DOB: 1951-03-07, [age] y.o.   MRN: [id number]  HPI Patient here today for follow up on back pain. We will also review her recent MRI.        Patient Active Problem List   Diagnosis Date Noted  . OBESITY, UNSPECIFIED 08/06/2009  . ESSENTIAL HYPERTENSION, BENIGN 08/06/2009  . SLEEP APNEA 08/06/2009  . CARDIOVASCULAR FUNCTION STUDY, ABNORMAL 08/06/2009   Outpatient Encounter Prescriptions as of 08/23/2014  Medication Sig  . aspirin 81 MG tablet Take 81 mg by mouth daily.  . cholecalciferol (VITAMIN D) 1000 UNITS tablet Take 1,000 Units by mouth daily.  Marland Kitchen. lisinopril-hydrochlorothiazide (PRINZIDE,ZESTORETIC) 20-12.5 MG per tablet TAKE 1 TABLET BY MOUTH DAILY  . simvastatin (ZOCOR) 20 MG tablet Take 1 tablet (20 mg total) by mouth at bedtime.    Review of Systems  Constitutional: Negative.   HENT: Negative.   Eyes: Negative.   Respiratory: Negative.   Cardiovascular: Negative.   Gastrointestinal: Negative.   Endocrine: Negative.   Genitourinary: Negative.   Musculoskeletal: Positive for back pain (some back pain - better).  Skin: Negative.   Allergic/Immunologic: Negative.   Neurological: Negative.   Hematological: Negative.   Psychiatric/Behavioral: Negative.        Objective:   Physical Exam BP 121/70 mmHg  Pulse 74  Temp(Src) 97 F (36.1 C) (Oral)  Ht 5\' 5"  (1.651 m)  Wt 318 lb (144.244 kg)  BMI 52.92 kg/m2        Assessment & Plan:

## 2014-08-23 NOTE — Progress Notes (Signed)
This encounter was created in error - please disregard.

## 2014-09-06 ENCOUNTER — Other Ambulatory Visit (INDEPENDENT_AMBULATORY_CARE_PROVIDER_SITE_OTHER): Payer: BC Managed Care – PPO

## 2014-09-06 DIAGNOSIS — I10 Essential (primary) hypertension: Secondary | ICD-10-CM

## 2014-09-06 DIAGNOSIS — E785 Hyperlipidemia, unspecified: Secondary | ICD-10-CM

## 2014-09-06 DIAGNOSIS — E559 Vitamin D deficiency, unspecified: Secondary | ICD-10-CM

## 2014-09-06 LAB — POCT CBC
Granulocyte percent: 75.3 %G (ref 37–80)
HEMATOCRIT: 44 % (ref 37.7–47.9)
HEMOGLOBIN: 14 g/dL (ref 12.2–16.2)
Lymph, poc: 1.7 (ref 0.6–3.4)
MCH, POC: 28.9 pg (ref 27–31.2)
MCHC: 31.8 g/dL (ref 31.8–35.4)
MCV: 90.7 fL (ref 80–97)
MPV: 8.1 fL (ref 0–99.8)
POC GRANULOCYTE: 6.1 (ref 2–6.9)
POC LYMPH PERCENT: 21.6 %L (ref 10–50)
Platelet Count, POC: 241 10*3/uL (ref 142–424)
RBC: 4.9 M/uL (ref 4.04–5.48)
RDW, POC: 13.9 %
WBC: 8.1 10*3/uL (ref 4.6–10.2)

## 2014-09-06 NOTE — Progress Notes (Signed)
Lab only 

## 2014-09-07 LAB — BMP8+EGFR
BUN/Creatinine Ratio: 24 (ref 11–26)
BUN: 18 mg/dL (ref 8–27)
CO2: 26 mmol/L (ref 18–29)
Calcium: 9.4 mg/dL (ref 8.7–10.3)
Chloride: 102 mmol/L (ref 97–108)
Creatinine, Ser: 0.76 mg/dL (ref 0.57–1.00)
GFR calc non Af Amer: 83 mL/min/{1.73_m2} (ref >59)
GFR, EST AFRICAN AMERICAN: 96 mL/min/{1.73_m2} (ref >59)
Glucose: 92 mg/dL (ref 65–99)
Potassium: 4.5 mmol/L (ref 3.5–5.2)
SODIUM: 142 mmol/L (ref 134–144)

## 2014-09-07 LAB — HEPATIC FUNCTION PANEL
ALT: 19 IU/L (ref 0–32)
AST: 16 IU/L (ref 0–40)
Albumin: 4.1 g/dL (ref 3.6–4.8)
Alkaline Phosphatase: 60 IU/L (ref 39–117)
BILIRUBIN DIRECT: 0.12 mg/dL (ref 0.00–0.40)
BILIRUBIN TOTAL: 0.5 mg/dL (ref 0.0–1.2)
Total Protein: 6.5 g/dL (ref 6.0–8.5)

## 2014-09-07 LAB — NMR, LIPOPROFILE
Cholesterol: 163 mg/dL (ref 100–199)
HDL Cholesterol by NMR: 51 mg/dL (ref >39)
HDL Particle Number: 34.9 umol/L (ref >=30.5)
LDL PARTICLE NUMBER: 1035 nmol/L — AB (ref <1000)
LDL Size: 20.6 nm (ref >20.5)
LDL-C: 89 mg/dL (ref 0–99)
LP-IR SCORE: 54 — AB (ref <=45)
Small LDL Particle Number: 378 nmol/L (ref <=527)
TRIGLYCERIDES BY NMR: 115 mg/dL (ref 0–149)

## 2014-09-07 LAB — VITAMIN D 25 HYDROXY (VIT D DEFICIENCY, FRACTURES): Vit D, 25-Hydroxy: 27.4 ng/mL — ABNORMAL LOW (ref 30.0–100.0)

## 2014-09-10 MED ORDER — VITAMIN D (ERGOCALCIFEROL) 1.25 MG (50000 UNIT) PO CAPS: 50000.0000 [IU] | ORAL_CAPSULE | ORAL | Status: DC

## 2014-09-10 NOTE — Addendum Note (Signed)
Addended by: Magdalene River on: 09/10/2014 02:24 PM   Modules accepted: Orders

## 2014-09-11 ENCOUNTER — Telehealth: Payer: Self-pay | Admitting: Family Medicine

## 2014-09-12 NOTE — Telephone Encounter (Signed)
Was not sure the vitamin D script was what she needed.  Level was below normal. Instructed to take script of vitamin d, one pill weekly.   B-12 for energy, can take this also.

## 2014-10-02 ENCOUNTER — Encounter: Payer: Self-pay | Admitting: Nurse Practitioner

## 2014-10-02 ENCOUNTER — Encounter (INDEPENDENT_AMBULATORY_CARE_PROVIDER_SITE_OTHER): Payer: BC Managed Care – PPO | Admitting: Nurse Practitioner

## 2014-10-02 NOTE — Progress Notes (Signed)
   Subjective:    Patient ID: Rhonda PettiesNancy E Threats, female    DOB: 11/12/1950, 64 y.o.   MRN: [id number]  HPI    Review of Systems     Objective:   Physical Exam        Assessment & Plan:  Patient unable to get on exam table due to back pain.

## 2014-10-03 ENCOUNTER — Other Ambulatory Visit: Payer: BC Managed Care – PPO | Admitting: Nurse Practitioner

## 2014-11-04 ENCOUNTER — Other Ambulatory Visit: Payer: Self-pay | Admitting: Family Medicine

## 2014-11-06 ENCOUNTER — Other Ambulatory Visit: Payer: Self-pay | Admitting: Family Medicine

## 2014-11-21 ENCOUNTER — Encounter (INDEPENDENT_AMBULATORY_CARE_PROVIDER_SITE_OTHER): Payer: Self-pay

## 2014-11-21 ENCOUNTER — Ambulatory Visit (INDEPENDENT_AMBULATORY_CARE_PROVIDER_SITE_OTHER): Payer: BC Managed Care – PPO | Admitting: Family Medicine

## 2014-11-21 ENCOUNTER — Encounter: Payer: Self-pay | Admitting: Family Medicine

## 2014-11-21 VITALS — BP 110/70 | HR 68 | Temp 98.1°F | Ht 65.0 in | Wt 305.0 lb

## 2014-11-21 DIAGNOSIS — E559 Vitamin D deficiency, unspecified: Secondary | ICD-10-CM | POA: Diagnosis not present

## 2014-11-21 DIAGNOSIS — I10 Essential (primary) hypertension: Secondary | ICD-10-CM

## 2014-11-21 DIAGNOSIS — M4726 Other spondylosis with radiculopathy, lumbar region: Secondary | ICD-10-CM

## 2014-11-21 DIAGNOSIS — E785 Hyperlipidemia, unspecified: Secondary | ICD-10-CM | POA: Diagnosis not present

## 2014-11-21 LAB — POCT CBC
Granulocyte percent: 71.7 %G (ref 37–80)
HEMATOCRIT: 47.3 % (ref 37.7–47.9)
HEMOGLOBIN: 14.3 g/dL (ref 12.2–16.2)
Lymph, poc: 1.6 (ref 0.6–3.4)
MCH, POC: 27.8 pg (ref 27–31.2)
MCHC: 30.2 g/dL — AB (ref 31.8–35.4)
MCV: 92.2 fL (ref 80–97)
MPV: 8.3 fL (ref 0–99.8)
POC GRANULOCYTE: 5.4 (ref 2–6.9)
POC LYMPH PERCENT: 21.5 %L (ref 10–50)
Platelet Count, POC: 262 10*3/uL (ref 142–424)
RBC: 5.13 M/uL (ref 4.04–5.48)
RDW, POC: 13.6 %
WBC: 7.6 10*3/uL (ref 4.6–10.2)

## 2014-11-21 NOTE — Progress Notes (Signed)
Subjective:    Patient ID: Rhonda Daniel, female    DOB: October 29, 1950, 64 y.o.   MRN: [id number]  HPI Pt here for follow up and management of chronic medical problems which includes hypertension. According to the patient she has been doing well. She is not a complainer. She has become the caregiver for her mother and her mother's home due to the debilitated condition her mother has had over the past several months. Tangie will get lab work today she will be given an FOBT to return she will be scheduled for a Pap smear in May and we will arrange a DEXA scan to be done at some point also. Her weight is down 10 pounds and this is important. She used to go to [employer] but has not been able to do this because of having to be at home with her mother. She does not need refills on any of her medicines.        Patient Active Problem List   Diagnosis Date Noted  . OBESITY, UNSPECIFIED 08/06/2009  . ESSENTIAL HYPERTENSION, BENIGN 08/06/2009  . SLEEP APNEA 08/06/2009  . CARDIOVASCULAR FUNCTION STUDY, ABNORMAL 08/06/2009   Outpatient Encounter Prescriptions as of 11/21/2014  Medication Sig  . aspirin 81 MG tablet Take 81 mg by mouth daily.  Marland Kitchen lisinopril-hydrochlorothiazide (PRINZIDE,ZESTORETIC) 20-12.5 MG per tablet TAKE 1 TABLET BY MOUTH DAILY  . simvastatin (ZOCOR) 20 MG tablet Take 1 tablet (20 mg total) by mouth at bedtime.  . Vitamin D, Ergocalciferol, (DRISDOL) 50000 UNITS CAPS capsule Take 1 capsule (50,000 Units total) by mouth every 7 (seven) days.  . [DISCONTINUED] cholecalciferol (VITAMIN D) 1000 UNITS tablet Take 1,000 Units by mouth daily.  . [DISCONTINUED] lisinopril-hydrochlorothiazide (PRINZIDE,ZESTORETIC) 20-12.5 MG per tablet TAKE 1 TABLET BY MOUTH DAILY    Review of Systems  Constitutional: Negative.   HENT: Negative.   Eyes: Negative.   Respiratory: Negative.   Cardiovascular: Negative.   Gastrointestinal: Negative.   Endocrine: Negative.   Genitourinary: Negative.     Musculoskeletal: Negative.   Skin: Negative.   Allergic/Immunologic: Negative.   Neurological: Negative.   Hematological: Negative.   Psychiatric/Behavioral: Negative.        Objective:   Physical Exam  Constitutional: She is oriented to person, place, and time. She appears well-developed and well-nourished. No distress.  Patient is pleasant, alert, and morbidly obese but has lost 11 pounds since her last visit.  HENT:  Head: Normocephalic and atraumatic.  Right Ear: External ear normal.  Left Ear: External ear normal.  Mouth/Throat: Oropharynx is clear and moist. No oropharyngeal exudate.  Mild nasal congestion bilaterally  Eyes: Conjunctivae and EOM are normal. Pupils are equal, round, and reactive to light. Right eye exhibits no discharge. Left eye exhibits no discharge. No scleral icterus.  Neck: Normal range of motion. Neck supple. No thyromegaly present.  Cardiovascular: Normal rate, regular rhythm, normal heart sounds and intact distal pulses.  Exam reveals no gallop and no friction rub.   No murmur heard. Regular rate and rhythm at 72/m  Pulmonary/Chest: Effort normal and breath sounds normal. No respiratory distress. She has no wheezes. She has no rales. She exhibits no tenderness.  Abdominal: Soft. Bowel sounds are normal. She exhibits no mass. There is no tenderness. There is no rebound and no guarding.  Morbidly obese without masses or tenderness  Musculoskeletal: Normal range of motion. She exhibits no edema or tenderness.  Range of motion is hesitant because of back and hip pain.  Lymphadenopathy:  She has no cervical adenopathy.  Neurological: She is alert and oriented to person, place, and time. She has normal reflexes. No cranial nerve deficit.  Skin: Skin is warm and dry. No rash noted.  Psychiatric: She has a normal mood and affect. Her behavior is normal. Judgment and thought content normal.  Nursing note and vitals reviewed.  BP 110/70 mmHg  Pulse 68   Temp(Src) 98.1 F (36.7 C) (Oral)  Ht '5\' 5"'  (1.651 m)  Wt 305 lb (138.347 kg)  BMI 50.75 kg/m2        Assessment & Plan:  1. Essential hypertension -The blood pressure is good today and she should continue with her current treatment - POCT CBC - BMP8+EGFR - Hepatic function panel  2. Hyperlipidemia -This is been under better control with recent lab work and she will have another evaluation today to ensure that the current treatment is adequate - POCT CBC - NMR, lipoprofile  3. Vitamin D deficiency -The patient should continue her vitamin D 50,000 units until lab work is returned and then we'll make adjustments at that time with the medication - POCT CBC - Vit D  25 hydroxy (rtn osteoporosis monitoring)  4. Morbid obesity -She was encouraged to go back to [employer] and was offered help in the office with the clinical pharmacist. 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 - POCT CBC  5. Osteoarthritis of spine with radiculopathy, lumbar region -Increase exercise and walking will help the back pain and hip pain  Patient Instructions  Continue current medications. Continue good therapeutic lifestyle changes which include good diet and exercise. Fall precautions discussed with patient. If an FOBT was given today- please return it to our front desk. If you are over 13 years old - you may need Prevnar 43 or the adult Pneumonia vaccine.  Flu Shots are still available at our office. If you still haven't had one please call to set up a nurse visit to get one.   After your visit with [place] today you will receive a survey in the mail or online from [employer] regarding your care with [place]. Please take a moment to fill this out. Your feedback is very important to [place] as you can help [place] better understand your patient needs as well as improve your experience and satisfaction. WE CARE ABOUT YOU!!!   Please continue to be careful and did not put yourself  at risk for falling Try to get back involved with [employer] to help you with more weight loss. Please get someone to come in and help relieve you at least 3 or 4 times weekly for 2 or 3 hours at a time Walking will be good for your back and your arthritis. She should return to the clinic as planned for her pelvic exam   Arrie Senate MD

## 2014-11-21 NOTE — Patient Instructions (Addendum)
Continue current medications. Continue good therapeutic lifestyle changes which include good diet and exercise. Fall precautions discussed with patient. If an FOBT was given today- please return it to our front desk. If you are over 65 years old - you may need Prevnar 13 or the adult Pneumonia vaccine.  Flu Shots are still available at our office. If you still haven't had one please call to set up a nurse visit to get one.   After your visit with us today you will receive a survey in the mail or online from [employer] regarding your care with us. Please take a moment to fill this out. Your feedback is very important to us as you can help us better understand your patient needs as well as improve your experience and satisfaction. WE CARE ABOUT YOU!!!   Please continue to be careful and did not put yourself at risk for falling Try to get back involved with [employer] to help you with more weight loss. Please get someone to come in and help relieve you at least 3 or 4 times weekly for 2 or 3 hours at a time Walking will be good for your back and your arthritis. She should return to the clinic as planned for her pelvic exam

## 2014-11-22 LAB — NMR, LIPOPROFILE
Cholesterol: 152 mg/dL (ref 100–199)
HDL Cholesterol by NMR: 48 mg/dL (ref >39)
HDL Particle Number: 33.4 umol/L (ref >=30.5)
LDL Particle Number: 1245 nmol/L — ABNORMAL HIGH (ref <1000)
LDL Size: 20 nm (ref >20.5)
LDL-C: 82 mg/dL (ref 0–99)
LP-IR Score: 62 — ABNORMAL HIGH (ref <=45)
SMALL LDL PARTICLE NUMBER: 764 nmol/L — AB (ref <=527)
Triglycerides by NMR: 112 mg/dL (ref 0–149)

## 2014-11-22 LAB — HEPATIC FUNCTION PANEL
ALT: 16 IU/L (ref 0–32)
AST: 18 IU/L (ref 0–40)
Albumin: 4.1 g/dL (ref 3.6–4.8)
Alkaline Phosphatase: 57 IU/L (ref 39–117)
Bilirubin Total: 0.5 mg/dL (ref 0.0–1.2)
Bilirubin, Direct: 0.14 mg/dL (ref 0.00–0.40)
TOTAL PROTEIN: 6.5 g/dL (ref 6.0–8.5)

## 2014-11-22 LAB — BMP8+EGFR
BUN/Creatinine Ratio: 24 (ref 11–26)
BUN: 18 mg/dL (ref 8–27)
CO2: 25 mmol/L (ref 18–29)
Calcium: 9.3 mg/dL (ref 8.7–10.3)
Chloride: 104 mmol/L (ref 97–108)
Creatinine, Ser: 0.74 mg/dL (ref 0.57–1.00)
GFR calc Af Amer: 99 mL/min/{1.73_m2} (ref >59)
GFR calc non Af Amer: 86 mL/min/{1.73_m2} (ref >59)
GLUCOSE: 93 mg/dL (ref 65–99)
Potassium: 5.2 mmol/L (ref 3.5–5.2)
Sodium: 149 mmol/L — ABNORMAL HIGH (ref 134–144)

## 2014-11-22 LAB — VITAMIN D 25 HYDROXY (VIT D DEFICIENCY, FRACTURES): VIT D 25 HYDROXY: 24.5 ng/mL — AB (ref 30.0–100.0)

## 2014-11-27 ENCOUNTER — Encounter: Payer: Self-pay | Admitting: Family Medicine

## 2014-12-12 ENCOUNTER — Telehealth: Payer: Self-pay | Admitting: Family Medicine

## 2014-12-13 NOTE — Telephone Encounter (Signed)
Telephone call is in regards to mother and has been handled under mothers chart

## 2015-01-27 ENCOUNTER — Other Ambulatory Visit: Payer: Self-pay | Admitting: Family Medicine

## 2015-02-25 ENCOUNTER — Other Ambulatory Visit: Payer: Self-pay | Admitting: Family Medicine

## 2015-02-25 NOTE — Telephone Encounter (Signed)
Last seen and last Vit D 11/21/14 DW[facility]   24.5

## 2015-03-29 ENCOUNTER — Other Ambulatory Visit (INDEPENDENT_AMBULATORY_CARE_PROVIDER_SITE_OTHER): Payer: BC Managed Care – PPO

## 2015-03-29 ENCOUNTER — Other Ambulatory Visit: Payer: Self-pay | Admitting: *Deleted

## 2015-03-29 DIAGNOSIS — I10 Essential (primary) hypertension: Secondary | ICD-10-CM

## 2015-03-29 DIAGNOSIS — E785 Hyperlipidemia, unspecified: Secondary | ICD-10-CM

## 2015-03-29 DIAGNOSIS — E559 Vitamin D deficiency, unspecified: Secondary | ICD-10-CM

## 2015-03-29 LAB — POCT CBC
GRANULOCYTE PERCENT: 75.2 % (ref 37–80)
HCT, POC: 47.8 % (ref 37.7–47.9)
Hemoglobin: 14.9 g/dL (ref 12.2–16.2)
Lymph, poc: 1.8 (ref 0.6–3.4)
MCH: 27.8 pg (ref 27–31.2)
MCHC: 31.1 g/dL — AB (ref 31.8–35.4)
MCV: 89.5 fL (ref 80–97)
MPV: 8.7 fL (ref 0–99.8)
POC GRANULOCYTE: 6 (ref 2–6.9)
POC LYMPH %: 22.5 % (ref 10–50)
Platelet Count, POC: 246 10*3/uL (ref 142–424)
RBC: 5.35 M/uL (ref 4.04–5.48)
RDW, POC: 13.7 %
WBC: 8 10*3/uL (ref 4.6–10.2)

## 2015-03-29 NOTE — Progress Notes (Signed)
Lab only 

## 2015-03-30 LAB — BMP8+EGFR
BUN/Creatinine Ratio: 26 (ref 11–26)
BUN: 18 mg/dL (ref 8–27)
CO2: 28 mmol/L (ref 18–29)
Calcium: 9.5 mg/dL (ref 8.7–10.3)
Chloride: 101 mmol/L (ref 97–108)
Creatinine, Ser: 0.69 mg/dL (ref 0.57–1.00)
GFR calc non Af Amer: 92 mL/min/{1.73_m2} (ref >59)
GFR, EST AFRICAN AMERICAN: 106 mL/min/{1.73_m2} (ref >59)
Glucose: 97 mg/dL (ref 65–99)
POTASSIUM: 4.6 mmol/L (ref 3.5–5.2)
SODIUM: 144 mmol/L (ref 134–144)

## 2015-03-30 LAB — VITAMIN D 25 HYDROXY (VIT D DEFICIENCY, FRACTURES): Vit D, 25-Hydroxy: 36.8 ng/mL (ref 30.0–100.0)

## 2015-03-30 LAB — HEPATIC FUNCTION PANEL
ALBUMIN: 4.3 g/dL (ref 3.6–4.8)
ALK PHOS: 59 IU/L (ref 39–117)
ALT: 13 IU/L (ref 0–32)
AST: 16 IU/L (ref 0–40)
Bilirubin Total: 0.3 mg/dL (ref 0.0–1.2)
Bilirubin, Direct: 0.11 mg/dL (ref 0.00–0.40)
Total Protein: 6.6 g/dL (ref 6.0–8.5)

## 2015-03-30 LAB — LIPID PANEL
CHOL/HDL RATIO: 3.2 ratio (ref 0.0–4.4)
CHOLESTEROL TOTAL: 159 mg/dL (ref 100–199)
HDL: 50 mg/dL (ref >39)
LDL CALC: 91 mg/dL (ref 0–99)
Triglycerides: 91 mg/dL (ref 0–149)
VLDL Cholesterol Cal: 18 mg/dL (ref 5–40)

## 2015-04-02 ENCOUNTER — Ambulatory Visit (INDEPENDENT_AMBULATORY_CARE_PROVIDER_SITE_OTHER): Payer: BC Managed Care – PPO | Admitting: Family Medicine

## 2015-04-02 ENCOUNTER — Encounter: Payer: Self-pay | Admitting: Family Medicine

## 2015-04-02 VITALS — BP 120/77 | HR 64 | Temp 97.7°F | Ht 65.0 in | Wt 284.0 lb

## 2015-04-02 DIAGNOSIS — E785 Hyperlipidemia, unspecified: Secondary | ICD-10-CM | POA: Diagnosis not present

## 2015-04-02 DIAGNOSIS — M5136 Other intervertebral disc degeneration, lumbar region: Secondary | ICD-10-CM

## 2015-04-02 DIAGNOSIS — E559 Vitamin D deficiency, unspecified: Secondary | ICD-10-CM | POA: Diagnosis not present

## 2015-04-02 DIAGNOSIS — I10 Essential (primary) hypertension: Secondary | ICD-10-CM

## 2015-04-02 NOTE — Progress Notes (Signed)
Subjective:    Patient ID: Rhonda Daniel, female    DOB: Jun 15, 1951, 64 y.o.   MRN: [id number]  HPI  Pt here for follow up and management of chronic medical problems. Which consist of hypertension and hyperlipidemia.  Patient is taking medication regularly. The patient has no specific complaints today. She has been attending [employer] and has lost from 305 pounds down to 284 pounds she is to be applauded for this. All of her lab work will be reviewed with her today and all the numbers are improved and excellent. She has an FOBT at home which she has not returned and she plans to return it. She wants to postpone her pelvic exam and her DEXA scan because of musculoskeletal issues but will do this at her convenience in the future.       Patient Active Problem List   Diagnosis Date Noted  . OBESITY, UNSPECIFIED 08/06/2009  . ESSENTIAL HYPERTENSION, BENIGN 08/06/2009  . SLEEP APNEA 08/06/2009  . CARDIOVASCULAR FUNCTION STUDY, ABNORMAL 08/06/2009   Outpatient Encounter Prescriptions as of 04/02/2015  Medication Sig  . aspirin 81 MG tablet Take 81 mg by mouth daily.  Marland Kitchen lisinopril-hydrochlorothiazide (PRINZIDE,ZESTORETIC) 20-12.5 MG per tablet TAKE 1 TABLET BY MOUTH DAILY  . simvastatin (ZOCOR) 20 MG tablet Take 1 tablet (20 mg total) by mouth at bedtime.  . Vitamin D, Ergocalciferol, (DRISDOL) 50000 UNITS CAPS capsule TAKE 1 CAPSULE (50,000 UNITS TOTAL) BY MOUTH EVERY 7 (SEVEN) DAYS.  . [DISCONTINUED] lisinopril-hydrochlorothiazide (PRINZIDE,ZESTORETIC) 20-12.5 MG per tablet TAKE 1 TABLET BY MOUTH DAILY   No facility-administered encounter medications on file as of 04/02/2015.         Review of Systems  Constitutional: Negative.   HENT: Negative.   Eyes: Negative.   Respiratory: Negative.   Cardiovascular: Negative.   Gastrointestinal: Negative.   Endocrine: Negative.   Genitourinary: Negative.   Musculoskeletal: Negative.   Skin: Negative.   Allergic/Immunologic: Negative.    Neurological: Negative.   Hematological: Negative.   Psychiatric/Behavioral: Negative.        Objective:   Physical Exam  Constitutional: She is oriented to person, place, and time. She appears well-developed and well-nourished.  HENT:  Head: Normocephalic and atraumatic.  Right Ear: External ear normal.  Left Ear: External ear normal.  Nose: Nose normal.  Mouth/Throat: Oropharynx is clear and moist.  Eyes: Conjunctivae and EOM are normal. Pupils are equal, round, and reactive to light. Right eye exhibits no discharge. Left eye exhibits no discharge. No scleral icterus.  Neck: Normal range of motion. Neck supple. No JVD present. No thyromegaly present.  No carotid bruits or anterior cervical adenopathy  Cardiovascular: Normal rate, regular rhythm and intact distal pulses.   No murmur heard. At 72/m  Pulmonary/Chest: Effort normal and breath sounds normal. No respiratory distress. She has no wheezes. She has no rales. She exhibits no tenderness.  Clear anteriorly and posteriorly  Abdominal: Soft. Bowel sounds are normal. She exhibits no mass. There is no tenderness. There is no rebound and no guarding.  Morbid obesity without masses tenderness or organ enlargement  Musculoskeletal: She exhibits no edema or tenderness.  Movement is somewhat hesitant with getting on the table laying down and a rising due to her chronic low back pain.  Lymphadenopathy:    She has no cervical adenopathy.  Neurological: She is alert and oriented to person, place, and time. She has normal reflexes. No cranial nerve deficit.  Skin: Skin is warm and dry. No rash noted.  Psychiatric: She has a normal mood and affect. Her behavior is normal. Judgment and thought content normal.  Nursing note and vitals reviewed.   BP 120/77 mmHg  Pulse 64  Temp(Src) 97.7 F (36.5 C) (Oral)  Ht  (1.651 m)  Wt 284 lb (128.822 kg)  BMI 47.26 kg/m2         Assessment & Plan:  1. Essential  hypertension -Continue with current blood pressure medication and record readings when possible outside the office  2. Hyperlipidemia -Continue with diet and current cholesterol treatment  3. Vitamin D deficiency -Continue with vitamin D 50,000 units weekly  4. Morbid obesity -Continue with Weight Watchers  5. Degenerative disc disease, lumbar -Continue with walking and exercising as much as possible and watching as to prevent falling in the future  No orders of the defined types were placed in this encounter.   Patient Instructions  Continue current medications. Continue good therapeutic lifestyle changes which include good diet and exercise. Fall precautions discussed with patient. If an FOBT was given today- please return it to our front desk. If you are over 47 years old - you may need Prevnar 13 or the adult Pneumonia vaccine.   After your visit with [place] today you will receive a survey in the mail or online from [employer] regarding your care with [place]. Please take a moment to fill this out. Your feedback is very important to [place] as you can help [place] better understand your patient needs as well as improve your experience and satisfaction. WE CARE ABOUT YOU!!!   Please return the FOBT card Please schedule for a DEXA scan and pelvic exam Do not forget to get your mammogram in October Continue to be careful do not put yourself at risk for falling Take prune juice more regularly to keep bowels moving more regularly Continue to watch sodium intake and drink plenty of water Continue with Weight Watchers   Nyra Capes MD

## 2015-04-02 NOTE — Patient Instructions (Addendum)
Continue current medications. Continue good therapeutic lifestyle changes which include good diet and exercise. Fall precautions discussed with patient. If an FOBT was given today- please return it to our front desk. If you are over 65 years old - you may need Prevnar 13 or the adult Pneumonia vaccine.   After your visit with [place] today you will receive a survey in the mail or online from [employer] regarding your care with [place]. Please take a moment to fill this out. Your feedback is very important to [place] as you can help [place] better understand your patient needs as well as improve your experience and satisfaction. WE CARE ABOUT YOU!!!   Please return the FOBT card Please schedule for a DEXA scan and pelvic exam Do not forget to get your mammogram in October Continue to be careful do not put yourself at risk for falling Take prune juice more regularly to keep bowels moving more regularly Continue to watch sodium intake and drink plenty of water Continue with Weight Watchers

## 2015-06-24 ENCOUNTER — Other Ambulatory Visit: Payer: Self-pay | Admitting: Family Medicine

## 2015-07-08 LAB — HM MAMMOGRAPHY: HM Mammogram: NEGATIVE

## 2015-07-10 ENCOUNTER — Encounter: Payer: Self-pay | Admitting: *Deleted

## 2015-08-08 ENCOUNTER — Other Ambulatory Visit: Payer: Self-pay | Admitting: Family Medicine

## 2015-08-08 NOTE — Telephone Encounter (Signed)
Last seen 04/02/15  DWM  Last Vit D 03/29/15  36.8

## 2015-08-13 ENCOUNTER — Ambulatory Visit (INDEPENDENT_AMBULATORY_CARE_PROVIDER_SITE_OTHER): Payer: Medicare Other | Admitting: Family Medicine

## 2015-08-13 ENCOUNTER — Encounter (INDEPENDENT_AMBULATORY_CARE_PROVIDER_SITE_OTHER): Payer: Self-pay

## 2015-08-13 ENCOUNTER — Encounter: Payer: Self-pay | Admitting: Family Medicine

## 2015-08-13 VITALS — BP 132/83 | HR 64 | Temp 97.1°F | Ht 65.0 in | Wt 262.0 lb

## 2015-08-13 DIAGNOSIS — E559 Vitamin D deficiency, unspecified: Secondary | ICD-10-CM

## 2015-08-13 DIAGNOSIS — M4726 Other spondylosis with radiculopathy, lumbar region: Secondary | ICD-10-CM

## 2015-08-13 DIAGNOSIS — M5136 Other intervertebral disc degeneration, lumbar region: Secondary | ICD-10-CM

## 2015-08-13 DIAGNOSIS — I10 Essential (primary) hypertension: Secondary | ICD-10-CM | POA: Diagnosis not present

## 2015-08-13 DIAGNOSIS — E785 Hyperlipidemia, unspecified: Secondary | ICD-10-CM

## 2015-08-13 NOTE — Progress Notes (Signed)
Subjective:    Patient ID: Rhonda Daniel, female    DOB: 12-16-1950, 64 y.o.   MRN: [id number]  HPI Pt here for follow up and management of chronic medical problems which includes hypertension and hyperlipidemia. She is taking medications regularly. The patient is doing well. She's been attending Weight Watchers regularly and has lost over 22 pounds and we are very excited about that. She is due to return an FOBT and will get lab work done today. She is in need of a Pap smear and wants to think about that as well as a DEXA scan and also wants to think about that too. Her blood pressure is good today and she has a history of hyperlipidemia. Her BMI is still 47 and she still working aggressively on her weight loss. She has lost a total of 50 pounds. The patient denies chest pain or shortness of breath. She is not having any trouble with her esophagus heartburn and indigestion nausea vomiting or diarrhea. She does have constipation and she takes prune juice for this. She is passing her water without problems. She is up-to-date on her eye exams. The patient is pretty much confined to her home because of having look after her mother who is elderly and has a lot of breathing issues. She is assisted on occasion by her brother.      Patient Active Problem List   Diagnosis Date Noted  . Hyperlipidemia 04/02/2015  . Morbid obesity ([place]) 08/06/2009  . ESSENTIAL HYPERTENSION, BENIGN 08/06/2009  . SLEEP APNEA 08/06/2009  . CARDIOVASCULAR FUNCTION STUDY, ABNORMAL 08/06/2009   Outpatient Encounter Prescriptions as of 08/13/2015  Medication Sig  . aspirin 81 MG tablet Take 81 mg by mouth daily.  Marland Kitchen lisinopril-hydrochlorothiazide (PRINZIDE,ZESTORETIC) 20-12.5 MG tablet TAKE 1 TABLET BY MOUTH DAILY  . simvastatin (ZOCOR) 20 MG tablet Take 1 tablet (20 mg total) by mouth at bedtime.  . Vitamin D, Ergocalciferol, (DRISDOL) 50000 UNITS CAPS capsule TAKE 1 CAPSULE (50,000 UNITS TOTAL) BY MOUTH EVERY 7 (SEVEN)  DAYS.   No facility-administered encounter medications on file as of 08/13/2015.     Review of Systems  Constitutional: Negative.   HENT: Negative.   Eyes: Negative.   Respiratory: Negative.   Cardiovascular: Negative.   Gastrointestinal: Negative.   Endocrine: Negative.   Genitourinary: Negative.   Musculoskeletal: Negative.   Skin: Negative.   Allergic/Immunologic: Negative.   Neurological: Negative.   Hematological: Negative.   Psychiatric/Behavioral: Negative.        Objective:   Physical Exam  Constitutional: She is oriented to person, place, and time. She appears well-developed and well-nourished.  The patient is pleasant and alert and is proud of her weight loss with Weight Watchers. She is going to continue with this as she has had a total of 50 pounds so far with this.  HENT:  Head: Normocephalic and atraumatic.  Right Ear: External ear normal.  Left Ear: External ear normal.  Nose: Nose normal.  Mouth/Throat: Oropharynx is clear and moist. No oropharyngeal exudate.  Eyes: Conjunctivae and EOM are normal. Pupils are equal, round, and reactive to light. Right eye exhibits no discharge. Left eye exhibits no discharge. No scleral icterus.  Neck: Normal range of motion. Neck supple. No thyromegaly present.  Cardiovascular: Normal rate, regular rhythm, normal heart sounds and intact distal pulses.   No murmur heard. At 72/m  Pulmonary/Chest: Effort normal and breath sounds normal. No respiratory distress. She has no wheezes. She has no rales. She exhibits no tenderness.  Clear anteriorly and posteriorly  Abdominal: Soft. Bowel sounds are normal. She exhibits no mass. There is no tenderness. There is no rebound and no guarding.  The abdomen remains morbidly obese. There is no spleen or liver enlargement and no masses and no abnormal bowel sounds are bruits  Musculoskeletal: She exhibits no edema or tenderness.  The patient's range of motion remains somewhat limited  especially with laying down and a rising because of the osteoarthritis in her low back.  Lymphadenopathy:    She has no cervical adenopathy.  Neurological: She is alert and oriented to person, place, and time. She has normal reflexes. No cranial nerve deficit.  Skin: Skin is warm and dry. No rash noted.  Psychiatric: She has a normal mood and affect. Her behavior is normal. Judgment and thought content normal.  Nursing note and vitals reviewed.   BP 132/83 mmHg  Pulse 64  Temp(Src) 97.1 F (36.2 C) (Oral)  Ht '5\' 5"'  (1.651 m)  Wt 262 lb (118.842 kg)  BMI 43.60 kg/m2       Assessment & Plan:  1. Hyperlipidemia -Continue current treatment pending results of lab work - CBC with Differential/Platelet - Lipid panel  2. Essential hypertension -The blood pressure is good today and she will continue with current treatment - BMP8+EGFR - CBC with Differential/Platelet - Hepatic function panel  3. Vitamin D deficiency -Continue with current treatment pending results of lab work - CBC with Differential/Platelet - VITAMIN D 25 Hydroxy (Vit-D Deficiency, Fractures)  4. Morbid obesity due to excess calories ([place]) -Continue with Weight Watchers  5. Degenerative disc disease, lumbar -Take Tylenol if needed for pain  6. Osteoarthritis of spine with radiculopathy, lumbar region -Tylenol for pain and moist heat and try to walk and exercise as much as possible  Patient Instructions                       Medicare Annual Wellness Visit  [place] and the medical providers at [facility] strive to bring you the best medical care.  In doing so we not only want to address your current medical conditions and concerns but also to detect new conditions early and prevent illness, disease and health-related problems.    Medicare offers a yearly Wellness Visit which allows our clinical staff to assess your need for preventative services including immunizations, lifestyle  education, counseling to decrease risk of preventable diseases and screening for fall risk and other medical concerns.    This visit is provided free of charge (no copay) for all Medicare recipients. The clinical pharmacists at [facility] have begun to conduct these Wellness Visits which will also include a thorough review of all your medications.    As you primary medical provider recommend that you make an appointment for your Annual Wellness Visit if you have not done so already this year.  You may set up this appointment before you leave today or you may call back [phone]) and schedule an appointment.  Please make sure when you call that you mention that you are scheduling your Annual Wellness Visit with the clinical pharmacist so that the appointment may be made for the proper length of time.     Continue current medications. Continue good therapeutic lifestyle changes which include good diet and exercise. Fall precautions discussed with patient. If an FOBT was given today- please return it to our front desk. If you are over 57 years old - you may  need Prevnar 13 or the adult Pneumonia vaccine.  **Flu shots are available--- please call and schedule a FLU-CLINIC appointment**  After your visit with [place] today you will receive a survey in the mail or online from [employer] regarding your care with [place]. Please take a moment to fill this out. Your feedback is very important to [place] as you can help [place] better understand your patient needs as well as improve your experience and satisfaction. WE CARE ABOUT YOU!!!   Return the FOBT Continue to be careful and do not put yourself at risk for falling At your next visit we will set you up for a pelvic exam and a DEXA scan. This winter continue to drink plenty of fluids and stay well hydrated and keep the house as cool as possible   Arrie Senate MD

## 2015-08-13 NOTE — Patient Instructions (Addendum)
Medicare Annual Wellness Visit  [place] and the medical providers at [facility] strive to bring you the best medical care.  In doing so we not only want to address your current medical conditions and concerns but also to detect new conditions early and prevent illness, disease and health-related problems.    Medicare offers a yearly Wellness Visit which allows our clinical staff to assess your need for preventative services including immunizations, lifestyle education, counseling to decrease risk of preventable diseases and screening for fall risk and other medical concerns.    This visit is provided free of charge (no copay) for all Medicare recipients. The clinical pharmacists at [facility] have begun to conduct these Wellness Visits which will also include a thorough review of all your medications.    As you primary medical provider recommend that you make an appointment for your Annual Wellness Visit if you have not done so already this year.  You may set up this appointment before you leave today or you may call back [phone] and schedule an appointment.  Please make sure when you call that you mention that you are scheduling your Annual Wellness Visit with the clinical pharmacist so that the appointment may be made for the proper length of time.     Continue current medications. Continue good therapeutic lifestyle changes which include good diet and exercise. Fall precautions discussed with patient. If an FOBT was given today- please return it to our front desk. If you are over 65 years old - you may need Prevnar 13 or the adult Pneumonia vaccine.  **Flu shots are available--- please call and schedule a FLU-CLINIC appointment**  After your visit with us today you will receive a survey in the mail or online from [employer] regarding your care with us. Please take a moment to fill this out. Your feedback is very  important to us as you can help us better understand your patient needs as well as improve your experience and satisfaction. WE CARE ABOUT YOU!!!   Return the FOBT Continue to be careful and do not put yourself at risk for falling At your next visit we will set you up for a pelvic exam and a DEXA scan. This winter continue to drink plenty of fluids and stay well hydrated and keep the house as cool as possible

## 2015-08-14 LAB — CBC WITH DIFFERENTIAL/PLATELET
BASOS ABS: 0 10*3/uL (ref 0.0–0.2)
Basos: 1 %
EOS (ABSOLUTE): 0.1 10*3/uL (ref 0.0–0.4)
Eos: 2 %
Hematocrit: 44.5 % (ref 34.0–46.6)
Hemoglobin: 14.3 g/dL (ref 11.1–15.9)
IMMATURE GRANS (ABS): 0 10*3/uL (ref 0.0–0.1)
IMMATURE GRANULOCYTES: 0 %
LYMPHS: 24 %
Lymphocytes Absolute: 1.4 10*3/uL (ref 0.7–3.1)
MCH: 29.8 pg (ref 26.6–33.0)
MCHC: 32.1 g/dL (ref 31.5–35.7)
MCV: 93 fL (ref 79–97)
Monocytes Absolute: 0.5 10*3/uL (ref 0.1–0.9)
Monocytes: 9 %
NEUTROS ABS: 3.9 10*3/uL (ref 1.4–7.0)
NEUTROS PCT: 64 %
PLATELETS: 228 10*3/uL (ref 150–379)
RBC: 4.8 x10E6/uL (ref 3.77–5.28)
RDW: 13.6 % (ref 12.3–15.4)
WBC: 6 10*3/uL (ref 3.4–10.8)

## 2015-08-14 LAB — BMP8+EGFR
BUN / CREAT RATIO: 28 — AB (ref 11–26)
BUN: 18 mg/dL (ref 8–27)
CALCIUM: 9.4 mg/dL (ref 8.7–10.3)
CO2: 27 mmol/L (ref 18–29)
CREATININE: 0.64 mg/dL (ref 0.57–1.00)
Chloride: 102 mmol/L (ref 97–106)
GFR calc non Af Amer: 94 mL/min/{1.73_m2} (ref >59)
GFR, EST AFRICAN AMERICAN: 108 mL/min/{1.73_m2} (ref >59)
Glucose: 90 mg/dL (ref 65–99)
Potassium: 3.9 mmol/L (ref 3.5–5.2)
Sodium: 143 mmol/L (ref 136–144)

## 2015-08-14 LAB — LIPID PANEL
CHOLESTEROL TOTAL: 138 mg/dL (ref 100–199)
Chol/HDL Ratio: 2.8 ratio units (ref 0.0–4.4)
HDL: 49 mg/dL (ref >39)
LDL CALC: 72 mg/dL (ref 0–99)
Triglycerides: 87 mg/dL (ref 0–149)
VLDL CHOLESTEROL CAL: 17 mg/dL (ref 5–40)

## 2015-08-14 LAB — VITAMIN D 25 HYDROXY (VIT D DEFICIENCY, FRACTURES): Vit D, 25-Hydroxy: 42.8 ng/mL (ref 30.0–100.0)

## 2015-08-14 LAB — HEPATIC FUNCTION PANEL
ALT: 13 IU/L (ref 0–32)
AST: 20 IU/L (ref 0–40)
Albumin: 4.1 g/dL (ref 3.6–4.8)
Alkaline Phosphatase: 53 IU/L (ref 39–117)
BILIRUBIN, DIRECT: 0.15 mg/dL (ref 0.00–0.40)
Bilirubin Total: 0.4 mg/dL (ref 0.0–1.2)
TOTAL PROTEIN: 6.5 g/dL (ref 6.0–8.5)

## 2015-08-22 ENCOUNTER — Other Ambulatory Visit: Payer: Self-pay | Admitting: Family Medicine

## 2015-09-24 ENCOUNTER — Other Ambulatory Visit: Payer: Self-pay

## 2015-09-24 MED ORDER — LISINOPRIL-HYDROCHLOROTHIAZIDE 20-12.5 MG PO TABS: 1.0000 | ORAL_TABLET | Freq: Every day | ORAL | Status: DC

## 2015-12-27 ENCOUNTER — Encounter (INDEPENDENT_AMBULATORY_CARE_PROVIDER_SITE_OTHER): Payer: Self-pay

## 2015-12-27 ENCOUNTER — Ambulatory Visit (INDEPENDENT_AMBULATORY_CARE_PROVIDER_SITE_OTHER): Payer: PPO | Admitting: Family Medicine

## 2015-12-27 ENCOUNTER — Encounter: Payer: Self-pay | Admitting: Family Medicine

## 2015-12-27 VITALS — BP 121/75 | HR 62 | Temp 97.4°F | Ht 65.0 in | Wt 251.0 lb

## 2015-12-27 DIAGNOSIS — K5901 Slow transit constipation: Secondary | ICD-10-CM | POA: Diagnosis not present

## 2015-12-27 DIAGNOSIS — E559 Vitamin D deficiency, unspecified: Secondary | ICD-10-CM | POA: Diagnosis not present

## 2015-12-27 DIAGNOSIS — M4726 Other spondylosis with radiculopathy, lumbar region: Secondary | ICD-10-CM

## 2015-12-27 DIAGNOSIS — I10 Essential (primary) hypertension: Secondary | ICD-10-CM

## 2015-12-27 DIAGNOSIS — E785 Hyperlipidemia, unspecified: Secondary | ICD-10-CM

## 2015-12-27 DIAGNOSIS — Z1211 Encounter for screening for malignant neoplasm of colon: Secondary | ICD-10-CM | POA: Diagnosis not present

## 2015-12-27 NOTE — Patient Instructions (Addendum)
Medicare Annual Wellness Visit  [place] and the medical providers at [facility] strive to bring you the best medical care.  In doing so we not only want to address your current medical conditions and concerns but also to detect new conditions early and prevent illness, disease and health-related problems.    Medicare offers a yearly Wellness Visit which allows our clinical staff to assess your need for preventative services including immunizations, lifestyle education, counseling to decrease risk of preventable diseases and screening for fall risk and other medical concerns.    This visit is provided free of charge (no copay) for all Medicare recipients. The clinical pharmacists at [facility] have begun to conduct these Wellness Visits which will also include a thorough review of all your medications.    As you primary medical provider recommend that you make an appointment for your Annual Wellness Visit if you have not done so already this year.  You may set up this appointment before you leave today or you may call back [phone] and schedule an appointment.  Please make sure when you call that you mention that you are scheduling your Annual Wellness Visit with the clinical pharmacist so that the appointment may be made for the proper length of time.     Continue current medications. Continue good therapeutic lifestyle changes which include good diet and exercise. Fall precautions discussed with patient. If an FOBT was given today- please return it to our front desk. If you are over 66 years old - you may need Prevnar 13 or the adult Pneumonia vaccine.  **Flu shots are available--- please call and schedule a FLU-CLINIC appointment**  After your visit with us today you will receive a survey in the mail or online from [employer] regarding your care with us. Please take a moment to fill this out. Your feedback is very  important to us as you can help us better understand your patient needs as well as improve your experience and satisfaction. WE CARE ABOUT YOU!!!   Continue with Weight Watchers and with aggressive therapeutic lifestyle changes Try Le croix to help with getting more fluids in Also consider trying MiraLAX 2 or 3 times weekly to assist with constipation You are to be applauded for your adherence to Weight Watchers and the excellent job you have done with losing 64 pounds of weight and 10 months. We are all very proud of you. We will schedule you for pelvic exam with Dr. Teena DunkBenson when she returns Please return the FOBT Please make sure that you get your DEXA scan scheduled We will call with the lab work results as soon as those results become available

## 2015-12-27 NOTE — Progress Notes (Signed)
Subjective:    Patient ID: Rhonda Daniel, female    DOB: 05-31-1951, 65 y.o.   MRN: [id number]  HPI Pt here for follow up and management of chronic medical problems which includes hypertension and hyperlipidemia. She is taking medications regularly.The patient has been going to Weight Watchers and she is doing incredible with her weight loss. Since the previous visit she is down 11 more pounds. Her body mass index is still in the morbid obese category. But we are very proud of the way she is doing. She does need to get scheduled for a pelvic exam and Pap smear. She will also schedule a DEXA scan. She needs to return an FOBT and she will get routine lab work today. She does complain of some constipation. The patient denies any chest pain or shortness of breath. She is swallowing her food without problems with no nausea vomiting diarrhea or heartburn indigestion blood in the stool or black tarry bowel movements. She does have constipation and attributes this to not drinking enough fluids. She is passing her water without problems. She still has problems with her back and hips and this seems to stem from a severe fall that she had over 2 years ago. She is doing better with this. Her movements are slow and intentional as to not aggravate her pain.     Patient Active Problem List   Diagnosis Date Noted  . Hyperlipidemia 04/02/2015  . Morbid obesity ([place]) 08/06/2009  . ESSENTIAL HYPERTENSION, BENIGN 08/06/2009  . SLEEP APNEA 08/06/2009  . CARDIOVASCULAR FUNCTION STUDY, ABNORMAL 08/06/2009   Outpatient Encounter Prescriptions as of 12/27/2015  Medication Sig  . aspirin 81 MG tablet Take 81 mg by mouth daily.  Marland Kitchen lisinopril-hydrochlorothiazide (PRINZIDE,ZESTORETIC) 20-12.5 MG tablet Take 1 tablet by mouth daily.  . simvastatin (ZOCOR) 20 MG tablet TAKE 1 TABLET (20 MG TOTAL) BY MOUTH AT BEDTIME.  Marland Kitchen Vitamin D, Ergocalciferol, (DRISDOL) 50000 UNITS CAPS capsule TAKE 1 CAPSULE (50,000 UNITS TOTAL) BY  MOUTH EVERY 7 (SEVEN) DAYS.   No facility-administered encounter medications on file as of 12/27/2015.      Review of Systems  Constitutional: Negative.   HENT: Negative.   Eyes: Negative.   Respiratory: Negative.   Cardiovascular: Negative.   Gastrointestinal: Positive for constipation.  Endocrine: Negative.   Genitourinary: Negative.   Musculoskeletal: Negative.   Skin: Negative.   Allergic/Immunologic: Negative.   Neurological: Negative.   Hematological: Negative.   Psychiatric/Behavioral: Negative.        Objective:   Physical Exam  Constitutional: She is oriented to person, place, and time. She appears well-developed and well-nourished. No distress.  Pleasant and alert  HENT:  Head: Normocephalic and atraumatic.  Right Ear: External ear normal.  Left Ear: External ear normal.  Nose: Nose normal.  Mouth/Throat: Oropharynx is clear and moist. No oropharyngeal exudate.  Eyes: Conjunctivae and EOM are normal. Pupils are equal, round, and reactive to light. Right eye exhibits no discharge. Left eye exhibits no discharge. No scleral icterus.  Neck: Normal range of motion. Neck supple. No thyromegaly present.  No bruits thyromegaly or adenopathy  Cardiovascular: Normal rate, regular rhythm, normal heart sounds and intact distal pulses.   No murmur heard. The heart is regular at 60/m  Pulmonary/Chest: Effort normal and breath sounds normal. No respiratory distress. She has no wheezes. She has no rales. She exhibits no tenderness.  Abdominal: Soft. Bowel sounds are normal. She exhibits no mass. There is no tenderness. There is no rebound and no  guarding.  Obesity without masses tenderness or organ enlargement or bruits  Musculoskeletal: Normal range of motion. She exhibits no edema or tenderness.  Hesitant range of motion especially laying down and sitting up because of back pain. All of this is from arthritis.  Lymphadenopathy:    She has no cervical adenopathy.    Neurological: She is alert and oriented to person, place, and time. She has normal reflexes. No cranial nerve deficit.  Skin: Skin is warm and dry. No rash noted.  Psychiatric: She has a normal mood and affect. Her behavior is normal. Judgment and thought content normal.  Nursing note and vitals reviewed.  BP 121/75 mmHg  Pulse 62  Temp(Src) 97.4 F (36.3 C) (Oral)  Ht _0  (1.651 m)  Wt 251 lb (113.853 kg)  BMI 41.77 kg/m2        Assessment & Plan:  1. Hyperlipidemia -Continue current treatment pending results of lab work - CBC with Differential/Platelet - Lipid panel  2. Essential hypertension -The blood pressure is good today and she will continue with current treatment - BMP8+EGFR - CBC with Differential/Platelet - Hepatic function panel  3. Vitamin D deficiency -Continue with current treatment pending results of lab - CBC with Differential/Platelet - VITAMIN D 25 Hydroxy (Vit-D Deficiency, Fractures)  4. Special screening for malignant neoplasms, colon -Return the FOBT that she has at home - Fecal occult blood, imunochemical; Future  5. Slow transit constipation -Increase fluid intake and consider trying MiraLAX to 3 times weekly  6. Osteoarthritis of spine with radiculopathy, lumbar region -Continue with weight loss and stay as active as possible and take extra strength Tylenol if needed for pain  7. Morbid obesity -The patient continues to make great strides with losing weight and we're very proud of her. She has lost 64 pounds and 10 months. She will continue with her Weight Watchers program and her therapeutic lifestyle changes. Patient Instructions                       Medicare Annual Wellness Visit  [place] and the medical providers at [facility] strive to bring you the best medical care.  In doing so we not only want to address your current medical conditions and concerns but also to detect new conditions early and prevent  illness, disease and health-related problems.    Medicare offers a yearly Wellness Visit which allows our clinical staff to assess your need for preventative services including immunizations, lifestyle education, counseling to decrease risk of preventable diseases and screening for fall risk and other medical concerns.    This visit is provided free of charge (no copay) for all Medicare recipients. The clinical pharmacists at [facility] have begun to conduct these Wellness Visits which will also include a thorough review of all your medications.    As you primary medical provider recommend that you make an appointment for your Annual Wellness Visit if you have not done so already this year.  You may set up this appointment before you leave today or you may call back [phone]) and schedule an appointment.  Please make sure when you call that you mention that you are scheduling your Annual Wellness Visit with the clinical pharmacist so that the appointment may be made for the proper length of time.     Continue current medications. Continue good therapeutic lifestyle changes which include good diet and exercise. Fall precautions discussed with patient. If an FOBT was  given today- please return it to our front desk. If you are over 43 years old - you may need Prevnar 68 or the adult Pneumonia vaccine.  **Flu shots are available--- please call and schedule a FLU-CLINIC appointment**  After your visit with [place] today you will receive a survey in the mail or online from [employer] regarding your care with [place]. Please take a moment to fill this out. Your feedback is very important to [place] as you can help [place] better understand your patient needs as well as improve your experience and satisfaction. WE CARE ABOUT YOU!!!   Continue with Weight Watchers and with aggressive therapeutic lifestyle changes Try Le croix to help with getting more fluids in Also consider trying MiraLAX 2 or 3  times weekly to assist with constipation You are to be applauded for your adherence to Weight Watchers and the excellent job you have done with losing 64 pounds of weight and 10 months. We are all very proud of you. We will schedule you for pelvic exam with Dr. Britta Mccreedy when she returns Please return the FOBT Please make sure that you get your DEXA scan scheduled We will call with the lab work results as soon as those results become available    Arrie Senate MD

## 2015-12-28 LAB — BMP8+EGFR
BUN / CREAT RATIO: 32 — AB (ref 12–28)
BUN: 19 mg/dL (ref 8–27)
CALCIUM: 9.4 mg/dL (ref 8.7–10.3)
CHLORIDE: 103 mmol/L (ref 96–106)
CO2: 25 mmol/L (ref 18–29)
Creatinine, Ser: 0.6 mg/dL (ref 0.57–1.00)
GFR calc Af Amer: 111 mL/min/{1.73_m2} (ref >59)
GFR calc non Af Amer: 96 mL/min/{1.73_m2} (ref >59)
GLUCOSE: 92 mg/dL (ref 65–99)
POTASSIUM: 4 mmol/L (ref 3.5–5.2)
Sodium: 147 mmol/L — ABNORMAL HIGH (ref 134–144)

## 2015-12-28 LAB — CBC WITH DIFFERENTIAL/PLATELET
BASOS ABS: 0 10*3/uL (ref 0.0–0.2)
Basos: 0 %
EOS (ABSOLUTE): 0.3 10*3/uL (ref 0.0–0.4)
Eos: 5 %
Hematocrit: 43 % (ref 34.0–46.6)
Hemoglobin: 14 g/dL (ref 11.1–15.9)
IMMATURE GRANS (ABS): 0 10*3/uL (ref 0.0–0.1)
IMMATURE GRANULOCYTES: 0 %
LYMPHS: 30 %
Lymphocytes Absolute: 1.6 10*3/uL (ref 0.7–3.1)
MCH: 30 pg (ref 26.6–33.0)
MCHC: 32.6 g/dL (ref 31.5–35.7)
MCV: 92 fL (ref 79–97)
MONOS ABS: 0.5 10*3/uL (ref 0.1–0.9)
Monocytes: 9 %
NEUTROS PCT: 56 %
Neutrophils Absolute: 3.1 10*3/uL (ref 1.4–7.0)
PLATELETS: 228 10*3/uL (ref 150–379)
RBC: 4.66 x10E6/uL (ref 3.77–5.28)
RDW: 13.4 % (ref 12.3–15.4)
WBC: 5.6 10*3/uL (ref 3.4–10.8)

## 2015-12-28 LAB — HEPATIC FUNCTION PANEL
ALT: 10 IU/L (ref 0–32)
AST: 15 IU/L (ref 0–40)
Albumin: 4.1 g/dL (ref 3.6–4.8)
Alkaline Phosphatase: 49 IU/L (ref 39–117)
BILIRUBIN, DIRECT: 0.15 mg/dL (ref 0.00–0.40)
Bilirubin Total: 0.5 mg/dL (ref 0.0–1.2)
TOTAL PROTEIN: 6.4 g/dL (ref 6.0–8.5)

## 2015-12-28 LAB — LIPID PANEL
CHOLESTEROL TOTAL: 141 mg/dL (ref 100–199)
Chol/HDL Ratio: 2.8 ratio units (ref 0.0–4.4)
HDL: 50 mg/dL (ref >39)
LDL CALC: 76 mg/dL (ref 0–99)
Triglycerides: 76 mg/dL (ref 0–149)
VLDL CHOLESTEROL CAL: 15 mg/dL (ref 5–40)

## 2015-12-28 LAB — VITAMIN D 25 HYDROXY (VIT D DEFICIENCY, FRACTURES): VIT D 25 HYDROXY: 50.3 ng/mL (ref 30.0–100.0)

## 2016-01-19 ENCOUNTER — Other Ambulatory Visit: Payer: Self-pay | Admitting: Family Medicine

## 2016-01-20 NOTE — Telephone Encounter (Signed)
Last seen and last Vit D 12/27/15  DWM  50.3

## 2016-02-15 ENCOUNTER — Other Ambulatory Visit: Payer: Self-pay | Admitting: Family Medicine

## 2016-04-11 ENCOUNTER — Other Ambulatory Visit: Payer: Self-pay | Admitting: Family Medicine

## 2016-04-17 ENCOUNTER — Other Ambulatory Visit: Payer: Self-pay | Admitting: Family Medicine

## 2016-04-22 ENCOUNTER — Other Ambulatory Visit: Payer: PPO | Admitting: Pediatrics

## 2016-05-04 ENCOUNTER — Ambulatory Visit (INDEPENDENT_AMBULATORY_CARE_PROVIDER_SITE_OTHER): Payer: PPO

## 2016-05-04 ENCOUNTER — Ambulatory Visit (INDEPENDENT_AMBULATORY_CARE_PROVIDER_SITE_OTHER): Payer: PPO | Admitting: Family Medicine

## 2016-05-04 ENCOUNTER — Encounter: Payer: Self-pay | Admitting: Family Medicine

## 2016-05-04 VITALS — BP 118/76 | HR 59 | Temp 97.0°F | Ht 65.0 in | Wt 242.0 lb

## 2016-05-04 DIAGNOSIS — E785 Hyperlipidemia, unspecified: Secondary | ICD-10-CM | POA: Diagnosis not present

## 2016-05-04 DIAGNOSIS — M5136 Other intervertebral disc degeneration, lumbar region: Secondary | ICD-10-CM | POA: Diagnosis not present

## 2016-05-04 DIAGNOSIS — I10 Essential (primary) hypertension: Secondary | ICD-10-CM

## 2016-05-04 DIAGNOSIS — K5901 Slow transit constipation: Secondary | ICD-10-CM | POA: Diagnosis not present

## 2016-05-04 DIAGNOSIS — E559 Vitamin D deficiency, unspecified: Secondary | ICD-10-CM

## 2016-05-04 NOTE — Patient Instructions (Addendum)
Medicare Annual Wellness Visit  [place] and the medical providers at [facility] strive to bring you the best medical care.  In doing so we not only want to address your current medical conditions and concerns but also to detect new conditions early and prevent illness, disease and health-related problems.    Medicare offers a yearly Wellness Visit which allows our clinical staff to assess your need for preventative services including immunizations, lifestyle education, counseling to decrease risk of preventable diseases and screening for fall risk and other medical concerns.    This visit is provided free of charge (no copay) for all Medicare recipients. The clinical pharmacists at [facility] have begun to conduct these Wellness Visits which will also include a thorough review of all your medications.    As you primary medical provider recommend that you make an appointment for your Annual Wellness Visit if you have not done so already this year.  You may set up this appointment before you leave today or you may call back [phone] and schedule an appointment.  Please make sure when you call that you mention that you are scheduling your Annual Wellness Visit with the clinical pharmacist so that the appointment may be made for the proper length of time.     Continue current medications. Continue good therapeutic lifestyle changes which include good diet and exercise. Fall precautions discussed with patient. If an FOBT was given today- please return it to our front desk. If you are over 66 years old - you may need Prevnar 13 or the adult Pneumonia vaccine.   After your visit with us today you will receive a survey in the mail or online from [employer] regarding your care with us. Please take a moment to fill this out. Your feedback is very important to us as you can help us better understand your patient needs as well as  improve your experience and satisfaction. WE CARE ABOUT YOU!!!    We will arrange for you to have an appointment with Dr. Oswaldo DoneVincent for pelvic exam Continue with Weight Watchers and your weight loss as you are doing, we are very proud of you for this. You are due to get the Prevnar vaccine She should get the Pneumovax in 1 year

## 2016-05-04 NOTE — Progress Notes (Signed)
Subjective:    Patient ID: Rhonda Daniel, female    DOB: 05/01/1951, 65 y.o.   MRN: [id number]  HPI  Pt here for follow up and management of chronic medical problems which includes hypertension. She is taking medications regularly.The patient is doing well today and is in good spirits. She continues to lose weight through Weight Watchers and has lost 9 additional pounds since the last visit. Her vital signs are stable. She has no particular complaints. She is due to get a chest x-ray and she will check with her insurance regarding the Prevnar vaccine especially since she is already 65 years old. The patient denies any chest pain or shortness of breath anymore than usual. She's having no trouble with swallowing heartburn indigestion nausea vomiting diarrhea or blood in the stool. She is passing her water without problems. She is still due to get a pelvic exam and we will make sure that she has an appointment before she leaves today to get that done. For some reason she is very reluctant about getting that done mostly because of her chronic back pain. She will continue to attend [employer] and will continue with weight loss efforts through weight watchers which we are very proud of that she is done.    Patient Active Problem List   Diagnosis Date Noted  . Hyperlipidemia 04/02/2015  . Morbid obesity ([place]) 08/06/2009  . ESSENTIAL HYPERTENSION, BENIGN 08/06/2009  . SLEEP APNEA 08/06/2009  . CARDIOVASCULAR FUNCTION STUDY, ABNORMAL 08/06/2009   Outpatient Encounter Prescriptions as of 05/04/2016  Medication Sig  . aspirin 81 MG tablet Take 81 mg by mouth daily.  Marland Kitchen lisinopril-hydrochlorothiazide (PRINZIDE,ZESTORETIC) 20-12.5 MG tablet TAKE 1 TABLET BY MOUTH DAILY.  . simvastatin (ZOCOR) 20 MG tablet TAKE 1 TABLET (20 MG TOTAL) BY MOUTH AT BEDTIME.  Marland Kitchen Vitamin D, Ergocalciferol, (DRISDOL) 50000 units CAPS capsule TAKE 1 CAPSULE (50,000 UNITS TOTAL) BY MOUTH EVERY 7 (SEVEN) DAYS.   No  facility-administered encounter medications on file as of 05/04/2016.      Review of Systems  Constitutional: Negative.   HENT: Negative.   Eyes: Negative.   Respiratory: Negative.   Cardiovascular: Negative.   Gastrointestinal: Negative.   Endocrine: Negative.   Genitourinary: Negative.   Musculoskeletal: Negative.   Skin: Negative.   Allergic/Immunologic: Negative.   Neurological: Negative.   Hematological: Negative.   Psychiatric/Behavioral: Negative.        Objective:   Physical Exam  Constitutional: She is oriented to person, place, and time. She appears well-developed and well-nourished. No distress.  HENT:  Head: Normocephalic and atraumatic.  Right Ear: External ear normal.  Left Ear: External ear normal.  Nose: Nose normal.  Mouth/Throat: Oropharynx is clear and moist.  Eyes: Conjunctivae and EOM are normal. Pupils are equal, round, and reactive to light. Right eye exhibits no discharge. Left eye exhibits no discharge. No scleral icterus.  Neck: Normal range of motion. Neck supple. No thyromegaly present.  Cardiovascular: Normal rate, regular rhythm and intact distal pulses.   No murmur heard. Pulmonary/Chest: Effort normal and breath sounds normal. No respiratory distress. She has no wheezes. She has no rales. She exhibits no tenderness.  Clear anteriorly and posteriorly  Abdominal: Soft. Bowel sounds are normal. She exhibits no mass. There is no tenderness. There is no rebound and no guarding.  Abdominal obesity without organ enlargement or masses or tenderness.  Musculoskeletal: Normal range of motion. She exhibits no edema.  The patient still has some back issues with osteoarthritis and  mentions that this is always worse when she gets up in the morning.  Lymphadenopathy:    She has no cervical adenopathy.  Neurological: She is alert and oriented to person, place, and time. She has normal reflexes. No cranial nerve deficit.  Skin: Skin is warm and dry. No rash  noted.  Psychiatric: She has a normal mood and affect. Her behavior is normal. Judgment and thought content normal.  The weight loss that she has had through Weight Watchers has been incredible for her mood and her positive attitude  Nursing note and vitals reviewed.  BP 118/76 (BP Location: Left Arm)   Pulse (!) 59   Temp 97 F (36.1 C) (Oral)   Ht '5\' 5"'  (1.651 m)   Wt 242 lb (109.8 kg)   BMI 40.27 kg/m         Assessment & Plan:  1. Hyperlipidemia -Continue current treatment and Weight Watchers diet along with exercise as much as tolerated. - CBC with Differential/Platelet - Lipid panel  2. Vitamin D deficiency -Continue current treatment pending results of lab work - CBC with Differential/Platelet - VITAMIN D 25 Hydroxy (Vit-D Deficiency, Fractures)  3. Essential hypertension -The blood pressure is excellent today and she will continue with sodium restriction and her current blood pressure treatment. - DG Chest 2 View; Future - BMP8+EGFR - CBC with Differential/Platelet - Hepatic function panel  4. Morbid obesity due to excess calories ([place]) -Continue with Weight Watchers diet  5. Slow transit constipation -This has improved and according to the patient is no longer an issue.  6. Degenerative disc disease, lumbar -The patient has wear and tear arthritis and she is aware the chest to be more careful with arising in the morning and with standing from a sitting position  Patient Instructions                       Medicare Annual Wellness Visit  [place] and the medical providers at [facility] strive to bring you the best medical care.  In doing so we not only want to address your current medical conditions and concerns but also to detect new conditions early and prevent illness, disease and health-related problems.    Medicare offers a yearly Wellness Visit which allows our clinical staff to assess your need for preventative services  including immunizations, lifestyle education, counseling to decrease risk of preventable diseases and screening for fall risk and other medical concerns.    This visit is provided free of charge (no copay) for all Medicare recipients. The clinical pharmacists at [facility] have begun to conduct these Wellness Visits which will also include a thorough review of all your medications.    As you primary medical provider recommend that you make an appointment for your Annual Wellness Visit if you have not done so already this year.  You may set up this appointment before you leave today or you may call back [phone]) and schedule an appointment.  Please make sure when you call that you mention that you are scheduling your Annual Wellness Visit with the clinical pharmacist so that the appointment may be made for the proper length of time.     Continue current medications. Continue good therapeutic lifestyle changes which include good diet and exercise. Fall precautions discussed with patient. If an FOBT was given today- please return it to our front desk. If you are over 57 years old - you may need Prevnar 23  or the adult Pneumonia vaccine.   After your visit with [place] today you will receive a survey in the mail or online from [employer] regarding your care with [place]. Please take a moment to fill this out. Your feedback is very important to [place] as you can help [place] better understand your patient needs as well as improve your experience and satisfaction. WE CARE ABOUT YOU!!!    We will arrange for you to have an appointment with Dr. Evette Doffing for pelvic exam Continue with Weight Watchers and your weight loss as you are doing, we are very proud of you for this. You are due to get the Prevnar vaccine He should get the Pneumovax in 1 year     Arrie Senate MD

## 2016-05-05 LAB — CBC WITH DIFFERENTIAL/PLATELET
BASOS ABS: 0 10*3/uL (ref 0.0–0.2)
BASOS: 1 %
EOS (ABSOLUTE): 0.2 10*3/uL (ref 0.0–0.4)
Eos: 3 %
HEMATOCRIT: 43.4 % (ref 34.0–46.6)
Hemoglobin: 14.1 g/dL (ref 11.1–15.9)
IMMATURE GRANULOCYTES: 0 %
Immature Grans (Abs): 0 10*3/uL (ref 0.0–0.1)
LYMPHS: 28 %
Lymphocytes Absolute: 1.7 10*3/uL (ref 0.7–3.1)
MCH: 30.1 pg (ref 26.6–33.0)
MCHC: 32.5 g/dL (ref 31.5–35.7)
MCV: 93 fL (ref 79–97)
MONOCYTES: 9 %
Monocytes Absolute: 0.5 10*3/uL (ref 0.1–0.9)
NEUTROS ABS: 3.7 10*3/uL (ref 1.4–7.0)
Neutrophils: 59 %
PLATELETS: 230 10*3/uL (ref 150–379)
RBC: 4.69 x10E6/uL (ref 3.77–5.28)
RDW: 13.3 % (ref 12.3–15.4)
WBC: 6.2 10*3/uL (ref 3.4–10.8)

## 2016-05-05 LAB — LIPID PANEL
CHOL/HDL RATIO: 2.7 ratio (ref 0.0–4.4)
Cholesterol, Total: 143 mg/dL (ref 100–199)
HDL: 53 mg/dL (ref >39)
LDL CALC: 78 mg/dL (ref 0–99)
Triglycerides: 60 mg/dL (ref 0–149)
VLDL CHOLESTEROL CAL: 12 mg/dL (ref 5–40)

## 2016-05-05 LAB — BMP8+EGFR
BUN/Creatinine Ratio: 27 (ref 12–28)
BUN: 20 mg/dL (ref 8–27)
CALCIUM: 9.5 mg/dL (ref 8.7–10.3)
CHLORIDE: 102 mmol/L (ref 96–106)
CO2: 29 mmol/L (ref 18–29)
Creatinine, Ser: 0.73 mg/dL (ref 0.57–1.00)
GFR, EST AFRICAN AMERICAN: 100 mL/min/{1.73_m2} (ref >59)
GFR, EST NON AFRICAN AMERICAN: 87 mL/min/{1.73_m2} (ref >59)
Glucose: 87 mg/dL (ref 65–99)
Potassium: 4.3 mmol/L (ref 3.5–5.2)
Sodium: 143 mmol/L (ref 134–144)

## 2016-05-05 LAB — HEPATIC FUNCTION PANEL
ALBUMIN: 4.1 g/dL (ref 3.6–4.8)
ALT: 13 IU/L (ref 0–32)
AST: 18 IU/L (ref 0–40)
Alkaline Phosphatase: 48 IU/L (ref 39–117)
Bilirubin Total: 0.5 mg/dL (ref 0.0–1.2)
Bilirubin, Direct: 0.14 mg/dL (ref 0.00–0.40)
Total Protein: 6.3 g/dL (ref 6.0–8.5)

## 2016-05-05 LAB — VITAMIN D 25 HYDROXY (VIT D DEFICIENCY, FRACTURES): Vit D, 25-Hydroxy: 45.2 ng/mL (ref 30.0–100.0)

## 2016-05-15 ENCOUNTER — Other Ambulatory Visit: Payer: Self-pay | Admitting: Family Medicine

## 2016-07-04 ENCOUNTER — Other Ambulatory Visit: Payer: Self-pay | Admitting: Family Medicine

## 2016-07-18 ENCOUNTER — Other Ambulatory Visit: Payer: Self-pay | Admitting: Family Medicine

## 2016-08-16 ENCOUNTER — Other Ambulatory Visit: Payer: Self-pay | Admitting: Family Medicine

## 2016-09-02 ENCOUNTER — Encounter: Payer: PPO | Admitting: *Deleted

## 2016-09-02 DIAGNOSIS — Z1231 Encounter for screening mammogram for malignant neoplasm of breast: Secondary | ICD-10-CM | POA: Diagnosis not present

## 2016-09-04 ENCOUNTER — Ambulatory Visit (INDEPENDENT_AMBULATORY_CARE_PROVIDER_SITE_OTHER): Payer: PPO | Admitting: Family Medicine

## 2016-09-04 ENCOUNTER — Encounter: Payer: Self-pay | Admitting: Family Medicine

## 2016-09-04 VITALS — BP 104/66 | HR 67 | Temp 97.1°F | Ht 65.0 in | Wt 231.0 lb

## 2016-09-04 DIAGNOSIS — E7800 Pure hypercholesterolemia, unspecified: Secondary | ICD-10-CM

## 2016-09-04 DIAGNOSIS — I10 Essential (primary) hypertension: Secondary | ICD-10-CM | POA: Diagnosis not present

## 2016-09-04 DIAGNOSIS — M4726 Other spondylosis with radiculopathy, lumbar region: Secondary | ICD-10-CM | POA: Diagnosis not present

## 2016-09-04 DIAGNOSIS — E559 Vitamin D deficiency, unspecified: Secondary | ICD-10-CM

## 2016-09-04 MED ORDER — FLUTICASONE PROPIONATE 50 MCG/ACT NA SUSP: 2.0000 | Freq: Every day | NASAL | 6 refills | Status: DC

## 2016-09-04 NOTE — Patient Instructions (Addendum)
Medicare Annual Wellness Visit  [place] and the medical providers at [facility] strive to bring you the best medical care.  In doing so we not only want to address your current medical conditions and concerns but also to detect new conditions early and prevent illness, disease and health-related problems.    Medicare offers a yearly Wellness Visit which allows our clinical staff to assess your need for preventative services including immunizations, lifestyle education, counseling to decrease risk of preventable diseases and screening for fall risk and other medical concerns.    This visit is provided free of charge (no copay) for all Medicare recipients. The clinical pharmacists at [facility] have begun to conduct these Wellness Visits which will also include a thorough review of all your medications.    As you primary medical provider recommend that you make an appointment for your Annual Wellness Visit if you have not done so already this year.  You may set up this appointment before you leave today or you may call back [phone] and schedule an appointment.  Please make sure when you call that you mention that you are scheduling your Annual Wellness Visit with the clinical pharmacist so that the appointment may be made for the proper length of time.     Continue current medications. Continue good therapeutic lifestyle changes which include good diet and exercise. Fall precautions discussed with patient. If an FOBT was given today- please return it to our front desk. If you are over 66 years old - you may need Prevnar 13 or the adult Pneumonia vaccine.  **Flu shots are available--- please call and schedule a FLU-CLINIC appointment**  After your visit with us today you will receive a survey in the mail or online from [employer] regarding your care with us. Please take a moment to fill this out. Your feedback is very  important to us as you can help us better understand your patient needs as well as improve your experience and satisfaction. WE CARE ABOUT YOU!!!  Continue with Weight Watchers and once again you're to be applauded for what you have accomplished with your diet habits since attending Weight Watchers. Continue to monitor blood pressures at home and bring readings by for review and as long as the blood pressures are in the 120s over the 60-70 range we will be happy with that.

## 2016-09-04 NOTE — Progress Notes (Signed)
Subjective:    Patient ID: Rhonda Daniel, female    DOB: [birth year], 65 y.o.   MRN: [id number]  HPI Pt here for follow up and management of chronic medical problems which includes hyperlipidemia and hypertension. She is taking medications regularly.The patient continues to go to Weight Watchers. It is important to note that she is complimented for the effort she is made at losing weight and avoiding all of the fallout from being too overweight. She also takes very good care of her mother.    Patient Active Problem List   Diagnosis Date Noted  . Hyperlipidemia 04/02/2015  . Morbid obesity ([place]) 08/06/2009  . ESSENTIAL HYPERTENSION, BENIGN 08/06/2009  . SLEEP APNEA 08/06/2009  . CARDIOVASCULAR FUNCTION STUDY, ABNORMAL 08/06/2009   Outpatient Encounter Prescriptions as of 09/04/2016  Medication Sig  . aspirin 81 MG tablet Take 81 mg by mouth daily.  Marland Kitchen lisinopril-hydrochlorothiazide (PRINZIDE,ZESTORETIC) 20-12.5 MG tablet TAKE 1 TABLET BY MOUTH DAILY.  . simvastatin (ZOCOR) 20 MG tablet TAKE 1 TABLET (20 MG TOTAL) BY MOUTH AT BEDTIME.  Marland Kitchen Vitamin D, Ergocalciferol, (DRISDOL) 50000 units CAPS capsule TAKE ONE CAPSULE EVERY SEVEN DAYS   No facility-administered encounter medications on file as of 09/04/2016.       Review of Systems  Constitutional: Negative.   HENT: Positive for congestion (nasal congestion).   Eyes: Negative.   Respiratory: Negative.   Cardiovascular: Negative.   Gastrointestinal: Negative.   Endocrine: Negative.   Genitourinary: Negative.   Musculoskeletal: Negative.   Skin: Negative.   Allergic/Immunologic: Negative.   Neurological: Negative.   Hematological: Negative.   Psychiatric/Behavioral: Negative.        Objective:   Physical Exam  Constitutional: She is oriented to person, place, and time. She appears well-developed and well-nourished.  HENT:  Head: Normocephalic and atraumatic.  Right Ear: External ear normal.  Left Ear: External ear normal.   Mouth/Throat: Oropharynx is clear and moist. No oropharyngeal exudate.  Slight nasal congestion on the right side  Eyes: Conjunctivae and EOM are normal. Pupils are equal, round, and reactive to light. Right eye exhibits no discharge. Left eye exhibits no discharge. No scleral icterus.  Neck: Normal range of motion. Neck supple. No thyromegaly present.  No bruits thyromegaly or anterior cervical adenopathy  Cardiovascular: Normal rate, regular rhythm, normal heart sounds and intact distal pulses.   No murmur heard. The heart has a regular rate and rhythm at 72/m  Pulmonary/Chest: Effort normal and breath sounds normal. No respiratory distress (slight nasal ion on the right side.). She has no wheezes. She has no rales.  Lungs were clear anteriorly and posteriorly  Abdominal: Soft. Bowel sounds are normal. She exhibits no mass. There is no tenderness. There is no rebound and no guarding.  Abdominal obesity. Much improved. No tenderness. The patient has lost almost 100 pound since attending Weight Watchers and she is applauded on multiple occasions during the visit today.  Musculoskeletal: Normal range of motion. She exhibits no edema.  Lymphadenopathy:    She has no cervical adenopathy.  Neurological: She is alert and oriented to person, place, and time. She has normal reflexes. No cranial nerve deficit.  Skin: Skin is warm and dry. No rash noted.  Psychiatric: She has a normal mood and affect. Her behavior is normal. Judgment and thought content normal.  Nursing note and vitals reviewed.  BP 104/66 (BP Location: Left Arm)   Pulse 67   Temp 97.1 F (36.2 C) (Oral)   Ht '5\' 5"'  (1.651  m)   Wt 231 lb (104.8 kg)   BMI 38.44 kg/m         Assessment & Plan:  1. Pure hypercholesterolemia -Continue with aggressive therapeutic lifestyle changes and current treatment regimen pending results of lab work - CBC with Differential/Platelet  2. Vitamin D deficiency -Continue with vitamin D  replacement pending results of lab work - CBC with Differential/Platelet - VITAMIN D 25 Hydroxy (Vit-D Deficiency, Fractures)  3. Essential hypertension -Continue to watch sodium intake and monitor blood pressures at home and if the readings get very low the patient should notify us of this. - BMP8+EGFR - CBC with Differential/Platelet - Hepatic function panel  4. Morbid obesity ([place]) -The patient's body mass index is now 38.44. She still is considered morbidly obese because of 2 risk factors and greater than a BMI of 35. She has lost over 100 pounds and she continues to work on losing her weight.  5. Osteoarthritis of spine with radiculopathy, lumbar region -Continue with weight loss efforts and take Tylenol as needed for pain and avoid or make all efforts to keep from falling  Meds ordered this encounter  Medications  . fluticasone (FLONASE) 50 MCG/ACT nasal spray    Sig: Place 2 sprays into both nostrils daily.    Dispense:  16 g    Refill:  6   Patient Instructions                       Medicare Annual Wellness Visit  [place] and the medical providers at [facility] strive to bring you the best medical care.  In doing so we not only want to address your current medical conditions and concerns but also to detect new conditions early and prevent illness, disease and health-related problems.    Medicare offers a yearly Wellness Visit which allows our clinical staff to assess your need for preventative services including immunizations, lifestyle education, counseling to decrease risk of preventable diseases and screening for fall risk and other medical concerns.    This visit is provided free of charge (no copay) for all Medicare recipients. The clinical pharmacists at [facility] have begun to conduct these Wellness Visits which will also include a thorough review of all your medications.    As you primary medical provider recommend  that you make an appointment for your Annual Wellness Visit if you have not done so already this year.  You may set up this appointment before you leave today or you may call back [phone]) and schedule an appointment.  Please make sure when you call that you mention that you are scheduling your Annual Wellness Visit with the clinical pharmacist so that the appointment may be made for the proper length of time.     Continue current medications. Continue good therapeutic lifestyle changes which include good diet and exercise. Fall precautions discussed with patient. If an FOBT was given today- please return it to our front desk. If you are over 75 years old - you may need Prevnar 28 or the adult Pneumonia vaccine.  **Flu shots are available--- please call and schedule a FLU-CLINIC appointment**  After your visit with [place] today you will receive a survey in the mail or online from [employer] regarding your care with [place]. Please take a moment to fill this out. Your feedback is very important to [place] as you can help [place] better understand your patient needs as well as improve your experience  and satisfaction. WE CARE ABOUT YOU!!!  Continue with Weight Watchers and once again you're to be applauded for what you have accomplished with your diet habits since attending Weight Watchers. Continue to monitor blood pressures at home and bring readings by for review and as long as the blood pressures are in the 120s over the 60-70 range we will be happy with that.   Arrie Senate MD

## 2016-09-05 LAB — CBC WITH DIFFERENTIAL/PLATELET
BASOS ABS: 0 10*3/uL (ref 0.0–0.2)
Basos: 0 %
EOS (ABSOLUTE): 0.1 10*3/uL (ref 0.0–0.4)
Eos: 3 %
HEMOGLOBIN: 13.8 g/dL (ref 11.1–15.9)
Hematocrit: 41 % (ref 34.0–46.6)
IMMATURE GRANS (ABS): 0 10*3/uL (ref 0.0–0.1)
IMMATURE GRANULOCYTES: 0 %
LYMPHS: 27 %
Lymphocytes Absolute: 1.4 10*3/uL (ref 0.7–3.1)
MCH: 31 pg (ref 26.6–33.0)
MCHC: 33.7 g/dL (ref 31.5–35.7)
MCV: 92 fL (ref 79–97)
MONOCYTES: 10 %
Monocytes Absolute: 0.5 10*3/uL (ref 0.1–0.9)
Neutrophils Absolute: 3 10*3/uL (ref 1.4–7.0)
Neutrophils: 60 %
Platelets: 210 10*3/uL (ref 150–379)
RBC: 4.45 x10E6/uL (ref 3.77–5.28)
RDW: 13.6 % (ref 12.3–15.4)
WBC: 5.1 10*3/uL (ref 3.4–10.8)

## 2016-09-05 LAB — HEPATIC FUNCTION PANEL
ALBUMIN: 3.8 g/dL (ref 3.6–4.8)
ALK PHOS: 50 IU/L (ref 39–117)
ALT: 17 IU/L (ref 0–32)
AST: 18 IU/L (ref 0–40)
BILIRUBIN TOTAL: 0.4 mg/dL (ref 0.0–1.2)
BILIRUBIN, DIRECT: 0.13 mg/dL (ref 0.00–0.40)
TOTAL PROTEIN: 6 g/dL (ref 6.0–8.5)

## 2016-09-05 LAB — BMP8+EGFR
BUN/Creatinine Ratio: 22 (ref 12–28)
BUN: 16 mg/dL (ref 8–27)
CALCIUM: 9.4 mg/dL (ref 8.7–10.3)
CHLORIDE: 104 mmol/L (ref 96–106)
CO2: 25 mmol/L (ref 18–29)
Creatinine, Ser: 0.72 mg/dL (ref 0.57–1.00)
GFR calc Af Amer: 101 mL/min/{1.73_m2} (ref >59)
GFR calc non Af Amer: 88 mL/min/{1.73_m2} (ref >59)
GLUCOSE: 87 mg/dL (ref 65–99)
Potassium: 4.3 mmol/L (ref 3.5–5.2)
Sodium: 144 mmol/L (ref 134–144)

## 2016-09-05 LAB — VITAMIN D 25 HYDROXY (VIT D DEFICIENCY, FRACTURES): VIT D 25 HYDROXY: 53.7 ng/mL (ref 30.0–100.0)

## 2016-09-08 NOTE — Addendum Note (Signed)
Addended by: Caryl BisBOWMAN, Thea Holshouser M on: 09/08/2016 10:55 AM   Modules accepted: Orders

## 2016-09-08 NOTE — Addendum Note (Signed)
Addended by: Caryl BisBOWMAN, Karis Rilling M on: 09/08/2016 03:58 PM   Modules accepted: Orders

## 2016-09-10 LAB — LIPID PANEL
CHOL/HDL RATIO: 2.8 ratio (ref 0.0–4.4)
CHOLESTEROL TOTAL: 144 mg/dL (ref 100–199)
HDL: 52 mg/dL (ref >39)
LDL CALC: 79 mg/dL (ref 0–99)
Triglycerides: 66 mg/dL (ref 0–149)
VLDL CHOLESTEROL CAL: 13 mg/dL (ref 5–40)

## 2016-09-10 LAB — SPECIMEN STATUS REPORT

## 2016-09-30 ENCOUNTER — Other Ambulatory Visit: Payer: Self-pay | Admitting: Family Medicine

## 2016-10-17 ENCOUNTER — Other Ambulatory Visit: Payer: Self-pay | Admitting: Family Medicine

## 2016-11-13 ENCOUNTER — Other Ambulatory Visit: Payer: Self-pay | Admitting: Family Medicine

## 2016-12-09 ENCOUNTER — Encounter: Payer: Self-pay | Admitting: *Deleted

## 2016-12-23 ENCOUNTER — Other Ambulatory Visit: Payer: Self-pay | Admitting: Family Medicine

## 2016-12-24 DIAGNOSIS — T63444A Toxic effect of venom of bees, undetermined, initial encounter: Secondary | ICD-10-CM | POA: Diagnosis not present

## 2016-12-29 ENCOUNTER — Ambulatory Visit (INDEPENDENT_AMBULATORY_CARE_PROVIDER_SITE_OTHER): Payer: PPO | Admitting: *Deleted

## 2016-12-29 VITALS — BP 108/65 | HR 56 | Temp 97.6°F | Ht 63.5 in | Wt 220.0 lb

## 2016-12-29 DIAGNOSIS — Z0000 Encounter for general adult medical examination without abnormal findings: Secondary | ICD-10-CM

## 2016-12-29 NOTE — Patient Instructions (Addendum)
  Ms. Gartley , Thank you for taking time to come for your Medicare Wellness Visit. I appreciate your ongoing commitment to your health goals. Please review the following plan we discussed and let me know if I can assist you in the future.   These are the goals we discussed: Goals    . Exercise 3x per week (30 min per time)          Become more active - walking and physically more active    . Weight (lb) < 175 lb (79.4 kg)       This is a list of the screening recommended for you and due dates:  Health Maintenance  Topic Date Due  . DEXA scan (bone density measurement)  02/01/2017*  .  Hepatitis C: One time screening is recommended by Center for Disease Control  (CDC) for  adults born from [birth year] through 1966.   04/04/2017*  . Pneumonia vaccines (1 of 2 - PCV13) 08/12/2017*  . Flu Shot  04/07/2017  . Mammogram  09/02/2018  . Colon Cancer Screening  11/21/2018  . Tetanus Vaccine  05/03/2019  *Topic was postponed. The date shown is not the original due date.   Keep follow up with Dr Christell Constant  Come by for your DEXA (bone density) and Wednesday before weight watchers. We will further discuss shingles and pneumonia vaccine at next visit as well as get a EKG.  review the advanced directives and bring [place] a copy once completed.

## 2016-12-29 NOTE — Progress Notes (Signed)
Subjective:   Rhonda Daniel is a 66 y.o. female who presents for an Initial Medicare Annual Wellness Visit.  Patient here today for annual medicare wellness exam. 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 She cares for and lives with her 73 year old mother, although she still has her own home. She keeps very busy with her mom and does all of her bathing, feeding, dressing and activities. In Rhonda Daniel's free time, she enjoys crafting, word puzzles and reading. She is part of a local teachers sorority and she attends church most Sundays. Her diet is very healthy, as she has to date lost 83 lbs with weight watchers. She has a brother and several nieces and nephews. She did not have children of her own. She is aware of fall hazards and recently put up a stair at her home for safety. She does have a cat. She states that her health today, is much better than it was a year ago.         Objective:    Today's Vitals   12/29/16 0831  BP: 108/65  Pulse: (!) 56  Temp: 97.6 F (36.4 C)  TempSrc: Oral  Weight: 220 lb (99.8 kg)  Height: 5' 3.5" (1.613 m)   Body mass index is 38.36 kg/m.   Current Medications (verified) Outpatient Encounter Prescriptions as of 12/29/2016  Medication Sig  . aspirin 81 MG tablet Take 81 mg by mouth daily.  . fluticasone (FLONASE) 50 MCG/ACT nasal spray Place 2 sprays into both nostrils daily.  Marland Kitchen lisinopril-hydrochlorothiazide (PRINZIDE,ZESTORETIC) 20-12.5 MG tablet TAKE 1 TABLET BY MOUTH DAILY.  . simvastatin (ZOCOR) 20 MG tablet TAKE 1 TABLET (20 MG TOTAL) BY MOUTH AT BEDTIME.  Marland Kitchen Vitamin D, Ergocalciferol, (DRISDOL) 50000 units CAPS capsule TAKE ONE CAPSULE EVERY SEVEN DAYS   No facility-administered encounter medications on file as of 12/29/2016.     Allergies (verified) Bee venom   History: Past Medical History:  Diagnosis Date  . Hyperlipidemia   . Hypertension   . Neuromuscular disorder (HCC)    hiatal hernia    Past Surgical  History:  Procedure Laterality Date  . ABDOMINAL HYSTERECTOMY    . APPENDECTOMY     with malignancy of appendix  . CHOLECYSTECTOMY    . COLON SURGERY     preventative - with appendix malignancy  . HERNIA REPAIR     Family History  Problem Relation Age of Onset  . Diabetes Mother   . Hypertension Mother   . Irregular heart beat Mother     pacemaker  . Hearing loss Mother     right  . Vision loss Mother     macular degeneration  . Rheumatic fever Father   . Pneumonia Father   . Cancer Daughter     cervical cancer  . Heart disease Paternal Aunt   . Cancer Paternal Aunt     tumor behind eye  . Diabetes Maternal Grandmother   . Vision loss Maternal Grandmother   . Heart disease Paternal Grandfather    Social History   Occupational History  . Not on file.   Social History Main Topics  . Smoking status: Never Smoker  . Smokeless tobacco: Never Used  . Alcohol use No  . Drug use: No  . Sexual activity: No    Tobacco Counseling Counseling given: Not Answered pat has never used tobacco products   Activities of Daily Living In your present state of health, do you  have any difficulty performing the following activities: 12/29/2016  Hearing? N  Vision? Y  Difficulty concentrating or making decisions? N  Walking or climbing stairs? Y  Dressing or bathing? N  Doing errands, shopping? N  Some recent data might be hidden  pt wears RX glasses daily and sees the eye DR yearly. She took a bad fall >year ago - since has some back pain that makes climbing stairs harder.  Immunizations and Health Maintenance Immunization History  Administered Date(s) Administered  . Tdap 05/02/2009   There are no preventive care reminders to display for this patient.  Patient Care Team: Ernestina Penna, MD as PCP - General (Family Medicine) Truc Gaylene Brooks, OD (Optometry) Rollene Rotunda, MD as Consulting Physician (Cardiology) Ovidio Kin, MD as Consulting Physician (General  Surgery)  Indicate any recent Medical Services you may have received from other than [facility] providers in the past year (date may be approximate).     Assessment:   This is a routine wellness examination for [name].   Hearing/Vision screen No exam data present No trouble with hearing and sees [facility] DR yearly in [place].   Dietary issues and exercise activities discussed:    Goals    . Exercise 3x per week (30 min per time)          Become more active - walking and physically more active    . Weight (lb) < 175 lb (79.4 kg)      Depression Screen PHQ 2/9 Scores 12/29/2016 09/04/2016 05/04/2016 12/27/2015 08/13/2015 04/02/2015 11/21/2014  PHQ - 2 Score 1 1 0 0 1 0 0    Fall Risk Fall Risk  12/29/2016 09/04/2016 05/04/2016 12/27/2015 08/13/2015  Falls in the past year? No No No No No    Cognitive Function: MMSE - Mini Mental State Exam 12/29/2016  Orientation to time 5  Orientation to Place 5  Registration 3  Attention/ Calculation 5  Recall 3  Language- name 2 objects 2  Language- repeat 1  Language- follow 3 step command 3  Language- read & follow direction 1  Write a sentence 1  Copy design 1  Total score 30  scored 30/30.      Screening Tests Health Maintenance  Topic Date Due  . DEXA SCAN  02/01/2017 (Originally 06/18/2015)  . Hepatitis C Screening  04/04/2017 (Originally 07-28-1951)  . PNA vac Low Risk Adult (1 of 2 - PCV13) 08/12/2017 (Originally 06/18/2015)  . INFLUENZA VACCINE  04/07/2017  . MAMMOGRAM  09/02/2018  . COLONOSCOPY  11/21/2018  . TETANUS/TDAP  05/03/2019      Plan:     Today we discussed the DEXA she is due for and she will plan to come in soon and do this on an evening she is here for weight watchers. At the next OV with Dr Christell Constant - we will further discuss the new shingles vaccine and most likely do the prevnar 13.  She is not comfortable with doing a PAP/pelvic at this time, but is aware to let us know if any problems arise.  We will make notes  to do a EKG at the next ov as well. She will review the advanced directives and bring [place] a copy once completed.  During the course of the visit, [name] was educated and counseled about the following appropriate screening and preventive services:   Vaccines to include Pneumoccal, Influenza, Hepatitis B, Td, Zostavax, HCV  Electrocardiogram  Cardiovascular disease screening  Colorectal cancer screening  Bone density screening  Diabetes  screening  Glaucoma screening  Mammography/PAP  Nutrition counseling  Smoking cessation counseling  Patient Instructions (the written plan) were given to the patient.    Bullins, Almond Lint, LPN   05/21/7828    I have reviewed and agree with the above AWV documentation.   Mary-Margaret Daphine Deutscher, FNP

## 2017-01-05 ENCOUNTER — Ambulatory Visit: Payer: PPO | Admitting: Family Medicine

## 2017-01-08 ENCOUNTER — Ambulatory Visit: Payer: PPO | Admitting: Family Medicine

## 2017-01-14 ENCOUNTER — Ambulatory Visit (INDEPENDENT_AMBULATORY_CARE_PROVIDER_SITE_OTHER): Payer: PPO | Admitting: Family Medicine

## 2017-01-14 ENCOUNTER — Encounter: Payer: Self-pay | Admitting: Family Medicine

## 2017-01-14 VITALS — BP 107/63 | HR 60 | Temp 97.2°F | Ht 65.0 in | Wt 218.0 lb

## 2017-01-14 DIAGNOSIS — E559 Vitamin D deficiency, unspecified: Secondary | ICD-10-CM

## 2017-01-14 DIAGNOSIS — I10 Essential (primary) hypertension: Secondary | ICD-10-CM | POA: Diagnosis not present

## 2017-01-14 DIAGNOSIS — M5136 Other intervertebral disc degeneration, lumbar region: Secondary | ICD-10-CM | POA: Diagnosis not present

## 2017-01-14 DIAGNOSIS — E7800 Pure hypercholesterolemia, unspecified: Secondary | ICD-10-CM

## 2017-01-14 DIAGNOSIS — D582 Other hemoglobinopathies: Secondary | ICD-10-CM | POA: Diagnosis not present

## 2017-01-14 DIAGNOSIS — M4726 Other spondylosis with radiculopathy, lumbar region: Secondary | ICD-10-CM

## 2017-01-14 DIAGNOSIS — R5383 Other fatigue: Secondary | ICD-10-CM | POA: Insufficient documentation

## 2017-01-14 NOTE — Patient Instructions (Addendum)
Medicare Annual Wellness Visit  [place] and the medical providers at [facility] strive to bring you the best medical care.  In doing so we not only want to address your current medical conditions and concerns but also to detect new conditions early and prevent illness, disease and health-related problems.    Medicare offers a yearly Wellness Visit which allows our clinical staff to assess your need for preventative services including immunizations, lifestyle education, counseling to decrease risk of preventable diseases and screening for fall risk and other medical concerns.    This visit is provided free of charge (no copay) for all Medicare recipients. The clinical pharmacists at [facility] have begun to conduct these Wellness Visits which will also include a thorough review of all your medications.    As you primary medical provider recommend that you make an appointment for your Annual Wellness Visit if you have not done so already this year.  You may set up this appointment before you leave today or you may call back ([phone]) and schedule an appointment.  Please make sure when you call that you mention that you are scheduling your Annual Wellness Visit with the clinical pharmacist so that the appointment may be made for the proper length of time.    Continue current medications. Continue good therapeutic lifestyle changes which include good diet and exercise. Fall precautions discussed with patient. If an FOBT was given today- please return it to our front desk. If you are over 67 years old - you may need Prevnar 13 or the adult Pneumonia vaccine.  **Flu shots are available--- please call and schedule a FLU-CLINIC appointment**  After your visit with us today you will receive a survey in the mail or online from [employer] regarding your care with us. Please take a moment to fill this out. Your feedback is very  important to us as you can help us better understand your patient needs as well as improve your experience and satisfaction. WE CARE ABOUT YOU!!!   Continue to make every effort to not put yourself at risk for falling Continue to go to Weight Watchers and bring the weight on down as much as possible through diet and exercise We will call with lab work results as soon as these results become available Please Rhonda Daniel name on the list to get your Prevnar vaccine and shingles shot We will do an EKG at the next visit Return the FOBT

## 2017-01-14 NOTE — Progress Notes (Signed)
Subjective:    Patient ID: Rhonda Daniel, female    DOB: 03/30/1951, 66 y.o.   MRN: [id number]  HPI  Pt is here today for follow of of chronic medical conditions which includes hyperlipidemia, hypertension and Vit D deficiency.The patient is doing well overall. She does not request any refills. Her weight today is 218 and she is down 2 more pounds since April 24 and this is great she has been attending Weight Watchers. She does have a history of hyperlipidemia hypertension and vitamin D deficiency so she is still in the morbid obese range even though she has a BMI of 38.4 because of 2 or more comorbidities. She is due to get lab work today she is due to get a DEXA scan and will be given an FOBT to return. She is in need of getting the Prevnar vaccine and the new shingle shot and an EKG. She prefers to wait on the shingles shot and the Prevnar today. The patient denies any chest pain or shortness of breath. She denies any trouble with nausea vomiting diarrhea blood in the stool or black tarry bowel movements. She is passing her water without problems. She does have some allergy and congestion which is typical for this time of the year and is using Flonase for this. She also continues to have some ongoing back and hip pain from her fall from  several years ago. She plans to get the shingles shot and the Prevnar 13 that at a different time.     Patient Active Problem List   Diagnosis Date Noted  . Hyperlipidemia 04/02/2015  . Morbid obesity (HCC) 08/06/2009  . ESSENTIAL HYPERTENSION, BENIGN 08/06/2009  . SLEEP APNEA 08/06/2009  . CARDIOVASCULAR FUNCTION STUDY, ABNORMAL 08/06/2009   Outpatient Encounter Prescriptions as of 01/14/2017  Medication Sig  . aspirin 81 MG tablet Take 81 mg by mouth daily.  . fluticasone (FLONASE) 50 MCG/ACT nasal spray Place 2 sprays into both nostrils daily.  Marland Kitchen. lisinopril-hydrochlorothiazide (PRINZIDE,ZESTORETIC) 20-12.5 MG tablet TAKE 1 TABLET BY MOUTH DAILY.  .  simvastatin (ZOCOR) 20 MG tablet TAKE 1 TABLET (20 MG TOTAL) BY MOUTH AT BEDTIME.  Marland Kitchen. Vitamin D, Ergocalciferol, (DRISDOL) 50000 units CAPS capsule TAKE ONE CAPSULE EVERY SEVEN DAYS   No facility-administered encounter medications on file as of 01/14/2017.      Review of Systems  HENT: Positive for postnasal drip.   Eyes: Negative.   Respiratory: Positive for cough (slight).   Gastrointestinal: Positive for constipation (slight).  Genitourinary: Negative.   Musculoskeletal: Negative.   Skin: Negative.   Allergic/Immunologic: Positive for environmental allergies.  Psychiatric/Behavioral: Negative.  Negative for sleep disturbance.       Objective:   Physical Exam  Constitutional: She is oriented to person, place, and time. She appears well-developed and well-nourished. No distress.  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 The patient is pleasant and alert and only complaining with pain in her back and hip areas.  HENT:  Head: Normocephalic and atraumatic.  Right Ear: External ear normal.  Left Ear: External ear normal.  Nose: Nose normal.  Mouth/Throat: Oropharynx is clear and moist.  Eyes: Conjunctivae and EOM are normal. Pupils are equal, round, and reactive to light. Right eye exhibits no discharge. Left eye exhibits no discharge. No scleral icterus.  Neck: Normal range of motion. Neck  supple. No thyromegaly present.  No bruits thyromegaly or anterior cervical adenopathy  Cardiovascular: Normal rate, regular rhythm, normal heart sounds and intact distal pulses.   No murmur heard. The heart has a regular rate and rhythm at 60/m  Pulmonary/Chest: Effort normal and breath sounds normal. No respiratory distress. She has no wheezes. She has no rales.  Clear anteriorly and posteriorly  Abdominal: Soft. Bowel sounds are normal. She exhibits no mass. There is no  tenderness. There is no rebound and no guarding.  Abdominal obesity without masses tenderness or organ enlargement or bruits  Musculoskeletal: Normal range of motion. She exhibits no edema or tenderness.  Hesitant range of motion due to back pain and hip pain but no accessory devices as needed.  Lymphadenopathy:    She has no cervical adenopathy.  Neurological: She is alert and oriented to person, place, and time. She has normal reflexes. No cranial nerve deficit.  Skin: Skin is warm and dry. No rash noted.  Psychiatric: She has a normal mood and affect. Her behavior is normal. Judgment and thought content normal.  Nursing note and vitals reviewed.   BP 107/63 (BP Location: Left Arm, Patient Position: Sitting, Cuff Size: Normal)   Pulse 60   Temp 97.2 F (36.2 C) (Oral)   Ht 5\' 5"  (1.651 m)   Wt 218 lb (98.9 kg)   BMI 36.28 kg/m        Assessment & Plan:  1. Vitamin D deficiency -Continue current treatment pending results of lab work  2. Morbid obesity (HCC) -Continue to work on weight loss through [employer] diet and exercise. She is lost almost 90 pounds with her Weight Watchers group.  3. Pure hypercholesterolemia -Continue simvastatin pending results of lab work  4. Essential hypertension -The blood pressure is good today and she will continue with current treatment  5. Osteoarthritis of spine with radiculopathy, lumbar region -If problems continue with back pain and hip pain, the patient will call back and we will make her an appointment with the orthopedic doctor from [facility] orth[facility]ics when they visit our office  6. Degenerative disc disease, lumbar -Continue to make every effort to walk as much as possible and avoid heavy lifting and avoid falling  7. Caregiver fatigue -Make every effort to get out as much as possible and have someone stay with your mother that is reputable and can help so you do not worry about leaving her  Patient Instructions                        Medicare Annual Wellness Visit  [place] and the medical providers at [facility] strive to bring you the best medical care.  In doing so we not only want to address your current medical conditions and concerns but also to detect new conditions early and prevent illness, disease and health-related problems.    Medicare offers a yearly Wellness Visit which allows our clinical staff to assess your need for preventative services including immunizations, lifestyle education, counseling to decrease risk of preventable diseases and screening for fall risk and other medical concerns.    This visit is provided free of charge (no copay) for all Medicare recipients. The clinical pharmacists at [facility] have begun to conduct these Wellness Visits which will also include a thorough review of all your medications.    As you primary medical provider recommend that you make an appointment for your Annual Wellness Visit if  you have not done so already this year.  You may set up this appointment before you leave today or you may call back ([phone]) and schedule an appointment.  Please make sure when you call that you mention that you are scheduling your Annual Wellness Visit with the clinical pharmacist so that the appointment may be made for the proper length of time.    Continue current medications. Continue good therapeutic lifestyle changes which include good diet and exercise. Fall precautions discussed with patient. If an FOBT was given today- please return it to our front desk. If you are over 70 years old - you may need Prevnar 13 or the adult Pneumonia vaccine.  **Flu shots are available--- please call and schedule a FLU-CLINIC appointment**  After your visit with [place] today you will receive a survey in the mail or online from [employer] regarding your care with [place]. Please take a moment to fill this out. Your feedback is very important to [place]  as you can help [place] better understand your patient needs as well as improve your experience and satisfaction. WE CARE ABOUT YOU!!!   Continue to make every effort to not put yourself at risk for falling Continue to go to Weight Watchers and bring the weight on down as much as possible through diet and exercise We will call with lab work results as soon as these results become available Please Rogelia Boga name on the list to get your Prevnar vaccine and shingles shot We will do an EKG at the next visit Return the FOBT   Nyra Capes MD

## 2017-01-14 NOTE — Addendum Note (Signed)
Addended by: Magdalene RiverBULLINS, Akeyla Molden H on: 01/14/2017 02:50 PM   Modules accepted: Orders

## 2017-01-15 LAB — CBC WITH DIFFERENTIAL/PLATELET
BASOS ABS: 0 10*3/uL (ref 0.0–0.2)
Basos: 0 %
EOS (ABSOLUTE): 0.1 10*3/uL (ref 0.0–0.4)
EOS: 2 %
HEMOGLOBIN: 16.7 g/dL — AB (ref 11.1–15.9)
Hematocrit: 49.5 % — ABNORMAL HIGH (ref 34.0–46.6)
IMMATURE GRANS (ABS): 0 10*3/uL (ref 0.0–0.1)
IMMATURE GRANULOCYTES: 0 %
LYMPHS ABS: 2.3 10*3/uL (ref 0.7–3.1)
Lymphs: 25 %
MCH: 27.6 pg (ref 26.6–33.0)
MCHC: 33.7 g/dL (ref 31.5–35.7)
MCV: 82 fL (ref 79–97)
MONOCYTES: 7 %
Monocytes Absolute: 0.6 10*3/uL (ref 0.1–0.9)
Neutrophils Absolute: 5.9 10*3/uL (ref 1.4–7.0)
Neutrophils: 66 %
Platelets: 223 10*3/uL (ref 150–379)
RBC: 6.06 x10E6/uL — AB (ref 3.77–5.28)
RDW: 14.5 % (ref 12.3–15.4)
WBC: 8.9 10*3/uL (ref 3.4–10.8)

## 2017-01-15 LAB — LIPID PANEL
CHOLESTEROL TOTAL: 134 mg/dL (ref 100–199)
Chol/HDL Ratio: 2.4 ratio (ref 0.0–4.4)
HDL: 56 mg/dL (ref >39)
LDL CALC: 69 mg/dL (ref 0–99)
TRIGLYCERIDES: 44 mg/dL (ref 0–149)
VLDL Cholesterol Cal: 9 mg/dL (ref 5–40)

## 2017-01-15 LAB — BMP8+EGFR
BUN/Creatinine Ratio: 34 — ABNORMAL HIGH (ref 12–28)
BUN: 23 mg/dL (ref 8–27)
CALCIUM: 9.6 mg/dL (ref 8.7–10.3)
CO2: 30 mmol/L — AB (ref 18–29)
CREATININE: 0.67 mg/dL (ref 0.57–1.00)
Chloride: 105 mmol/L (ref 96–106)
GFR calc Af Amer: 106 mL/min/{1.73_m2} (ref >59)
GFR calc non Af Amer: 92 mL/min/{1.73_m2} (ref >59)
GLUCOSE: 85 mg/dL (ref 65–99)
POTASSIUM: 3.8 mmol/L (ref 3.5–5.2)
SODIUM: 146 mmol/L — AB (ref 134–144)

## 2017-01-15 LAB — HEPATIC FUNCTION PANEL
ALT: 14 IU/L (ref 0–32)
AST: 21 IU/L (ref 0–40)
Albumin: 4.1 g/dL (ref 3.6–4.8)
Alkaline Phosphatase: 56 IU/L (ref 39–117)
BILIRUBIN TOTAL: 0.4 mg/dL (ref 0.0–1.2)
BILIRUBIN, DIRECT: 0.12 mg/dL (ref 0.00–0.40)
Total Protein: 6.4 g/dL (ref 6.0–8.5)

## 2017-01-15 LAB — VITAMIN D 25 HYDROXY (VIT D DEFICIENCY, FRACTURES): VIT D 25 HYDROXY: 54.8 ng/mL (ref 30.0–100.0)

## 2017-01-15 NOTE — Addendum Note (Signed)
Addended by: Lorelee CoverOSTOSKY, Jadie Allington C on: 01/15/2017 11:16 AM   Modules accepted: Orders

## 2017-02-12 ENCOUNTER — Other Ambulatory Visit: Payer: PPO

## 2017-02-12 DIAGNOSIS — D582 Other hemoglobinopathies: Secondary | ICD-10-CM | POA: Diagnosis not present

## 2017-02-12 LAB — CBC WITH DIFFERENTIAL/PLATELET
BASOS: 1 %
Basophils Absolute: 0.1 10*3/uL (ref 0.0–0.2)
EOS (ABSOLUTE): 0.2 10*3/uL (ref 0.0–0.4)
EOS: 3 %
HEMOGLOBIN: 13.2 g/dL (ref 11.1–15.9)
Hematocrit: 40.4 % (ref 34.0–46.6)
IMMATURE GRANS (ABS): 0 10*3/uL (ref 0.0–0.1)
IMMATURE GRANULOCYTES: 0 %
LYMPHS: 34 %
Lymphocytes Absolute: 1.9 10*3/uL (ref 0.7–3.1)
MCH: 30.2 pg (ref 26.6–33.0)
MCHC: 32.7 g/dL (ref 31.5–35.7)
MCV: 92 fL (ref 79–97)
MONOCYTES: 10 %
Monocytes Absolute: 0.6 10*3/uL (ref 0.1–0.9)
NEUTROS PCT: 52 %
Neutrophils Absolute: 3 10*3/uL (ref 1.4–7.0)
PLATELETS: 219 10*3/uL (ref 150–379)
RBC: 4.37 x10E6/uL (ref 3.77–5.28)
RDW: 13.5 % (ref 12.3–15.4)
WBC: 5.7 10*3/uL (ref 3.4–10.8)

## 2017-02-13 ENCOUNTER — Other Ambulatory Visit: Payer: Self-pay | Admitting: Family Medicine

## 2017-03-15 ENCOUNTER — Other Ambulatory Visit: Payer: Self-pay | Admitting: Family Medicine

## 2017-04-21 ENCOUNTER — Other Ambulatory Visit: Payer: Self-pay | Admitting: Family Medicine

## 2017-04-21 NOTE — Telephone Encounter (Signed)
Next OV 05/17/17 

## 2017-05-17 ENCOUNTER — Ambulatory Visit (INDEPENDENT_AMBULATORY_CARE_PROVIDER_SITE_OTHER): Payer: PPO | Admitting: Family Medicine

## 2017-05-17 ENCOUNTER — Encounter: Payer: Self-pay | Admitting: Family Medicine

## 2017-05-17 VITALS — BP 118/77 | HR 58 | Temp 97.3°F | Ht 65.0 in | Wt 224.0 lb

## 2017-05-17 DIAGNOSIS — E7800 Pure hypercholesterolemia, unspecified: Secondary | ICD-10-CM | POA: Diagnosis not present

## 2017-05-17 DIAGNOSIS — R198 Other specified symptoms and signs involving the digestive system and abdomen: Secondary | ICD-10-CM | POA: Diagnosis not present

## 2017-05-17 DIAGNOSIS — E559 Vitamin D deficiency, unspecified: Secondary | ICD-10-CM | POA: Diagnosis not present

## 2017-05-17 DIAGNOSIS — I10 Essential (primary) hypertension: Secondary | ICD-10-CM | POA: Diagnosis not present

## 2017-05-17 DIAGNOSIS — J301 Allergic rhinitis due to pollen: Secondary | ICD-10-CM | POA: Diagnosis not present

## 2017-05-17 NOTE — Patient Instructions (Signed)
Continue with Flonase 2 sprays each nostril daily at bedtime Use nasal saline spray in each nostril during the day Take Claritin 1 daily Continue to drink plenty of fluids We will arrange for you to have an abdominal ultrasound because of the fullness that was felt in the left lower quadrant Please continue to plan to get your pelvic exam and DEXA scan  Take BuSpar that you have at home starting with 5 mg twice daily and increase the dose to as much as 20 mg twice daily.

## 2017-05-17 NOTE — Progress Notes (Signed)
Subjective:    Patient ID: Rhonda Daniel, female    DOB: Dec 13, 1950, 66 y.o.   MRN: [id number]  HPI Pt here for follow up and management of chronic medical problems which includes hyperlipidemia and hypertension. She is taking medication regularly.The patient is doing well overall and today only complains of nasal congestion. She's been under a lot of stress recently because her mother has been in hospice and will be moved to an extended care facility from the hospice home. She is due to get a DEXA scan and Pap smear but because of the stress currently she will have to schedule this again in the future. She will be given an FOBT to return and will get lab work today. She is mentioned to me about all of the stress and with talk to her about taking some BuSpar. The patient denies any chest pain or shortness of breath. She denies any trouble with swallowing heartburn indigestion nausea vomiting diarrhea or blood in the stool. She is passing her water without problems.    Patient Active Problem List   Diagnosis Date Noted  . Caregiver with fatigue 01/14/2017  . Hyperlipidemia 04/02/2015  . Morbid obesity ([place]) 08/06/2009  . ESSENTIAL HYPERTENSION, BENIGN 08/06/2009  . SLEEP APNEA 08/06/2009  . CARDIOVASCULAR FUNCTION STUDY, ABNORMAL 08/06/2009   Outpatient Encounter Prescriptions as of 05/17/2017  Medication Sig  . aspirin 81 MG tablet Take 81 mg by mouth daily.  . fluticasone (FLONASE) 50 MCG/ACT nasal spray Place 2 sprays into both nostrils daily.  Marland Kitchen lisinopril-hydrochlorothiazide (PRINZIDE,ZESTORETIC) 20-12.5 MG tablet TAKE 1 TABLET BY MOUTH DAILY.  . simvastatin (ZOCOR) 20 MG tablet TAKE 1 TABLET (20 MG TOTAL) BY MOUTH AT BEDTIME.  Marland Kitchen Vitamin D, Ergocalciferol, (DRISDOL) 50000 units CAPS capsule TAKE ONE CAPSULE EVERY SEVEN DAYS   No facility-administered encounter medications on file as of 05/17/2017.       Review of Systems  Constitutional: Negative.   HENT: Positive for congestion  (nasal ).   Eyes: Negative.   Respiratory: Negative.   Cardiovascular: Negative.   Gastrointestinal: Negative.   Endocrine: Negative.   Genitourinary: Negative.   Musculoskeletal: Negative.   Skin: Negative.   Allergic/Immunologic: Negative.   Neurological: Negative.   Hematological: Negative.   Psychiatric/Behavioral: Negative.        Objective:   Physical Exam  Constitutional: She is oriented to person, place, and time. She appears well-developed and well-nourished. She appears distressed.  The patient is doing well other than the stress she is going through with taking care of her mother who has become completely disabled and is being moved to a long-term care facility in the next few days.  HENT:  Head: Normocephalic and atraumatic.  Right Ear: External ear normal.  Left Ear: External ear normal.  Mouth/Throat: Oropharynx is clear and moist. No oropharyngeal exudate.  Deviated septum to the right  Eyes: Pupils are equal, round, and reactive to light. Conjunctivae and EOM are normal. Right eye exhibits no discharge. Left eye exhibits no discharge. No scleral icterus.  Patient is up-to-date on her eye exams  Neck: Normal range of motion. Neck supple. No thyromegaly present.  No bruits thyromegaly or anterior cervical adenopathy  Cardiovascular: Normal rate, regular rhythm, normal heart sounds and intact distal pulses.   No murmur heard. Heart is regular at 60/m  Pulmonary/Chest: Effort normal and breath sounds normal. No respiratory distress. She has no wheezes. She has no rales.  Clear anteriorly and posteriorly  Abdominal: Soft. Bowel sounds are  normal. She exhibits no mass. There is no tenderness. There is no rebound and no guarding.  Abdominal obesity with out liver or spleen enlargement. There is a midline abdominal scar. There is fullness in the left lower quadrant. The patient has had no complaints or changes in bowel habits as she no longer has loose bowel movements and  her stools are formed.  Musculoskeletal: Normal range of motion. She exhibits no edema.  The patient moves hesitantly because of her chronic back pain.  Lymphadenopathy:    She has no cervical adenopathy.  Neurological: She is alert and oriented to person, place, and time. She has normal reflexes. No cranial nerve deficit.  Skin: Skin is warm and dry. No rash noted.  Psychiatric: She has a normal mood and affect. Her behavior is normal. Judgment and thought content normal.  Nursing note and vitals reviewed.   BP 118/77 (BP Location: Left Arm)   Pulse (!) 58   Temp (!) 97.3 F (36.3 C) (Oral)   Ht '5\' 5"'  (1.651 m)   Wt 224 lb (101.6 kg)   BMI 37.28 kg/m        Assessment & Plan:  1. Vitamin D deficiency -Continue current treatment pending results of lab work - CBC with Differential/Platelet - VITAMIN D 25 Hydroxy (Vit-D Deficiency, Fractures)  2. Pure hypercholesterolemia -Continue with aggressive therapeutic lifestyle changes and statin drug pending results of lab work - CBC with Differential/Platelet - Lipid panel  3. Essential hypertension -The blood pressure is good today and she will continue with current treatment - BMP8+EGFR - CBC with Differential/Platelet - Hepatic function panel  4. Abdominal fullness in left lower quadrant - US Abdomen Complete; Future  5. Seasonal allergic rhinitis due to pollen -Continue with Flonase 2 sprays each nostril daily at bedtime. Add Claritin once daily. Add nasal saline spray to each nostril 2 or 3 times daily.  Patient Instructions  Continue with Flonase 2 sprays each nostril daily at bedtime Use nasal saline spray in each nostril during the day Take Claritin 1 daily Continue to drink plenty of fluids We will arrange for you to have an abdominal ultrasound because of the fullness that was felt in the left lower quadrant Please continue to plan to get your pelvic exam and DEXA scan  Take BuSpar that you have at home starting  with 5 mg twice daily and increase the dose to as much as 20 mg twice daily.  Arrie Senate MD

## 2017-05-18 LAB — CBC WITH DIFFERENTIAL/PLATELET
BASOS: 0 %
Basophils Absolute: 0 10*3/uL (ref 0.0–0.2)
EOS (ABSOLUTE): 0.1 10*3/uL (ref 0.0–0.4)
Eos: 2 %
HEMOGLOBIN: 13.8 g/dL (ref 11.1–15.9)
Hematocrit: 41.7 % (ref 34.0–46.6)
IMMATURE GRANS (ABS): 0 10*3/uL (ref 0.0–0.1)
Immature Granulocytes: 0 %
LYMPHS: 27 %
Lymphocytes Absolute: 1.8 10*3/uL (ref 0.7–3.1)
MCH: 30.7 pg (ref 26.6–33.0)
MCHC: 33.1 g/dL (ref 31.5–35.7)
MCV: 93 fL (ref 79–97)
Monocytes Absolute: 0.5 10*3/uL (ref 0.1–0.9)
Monocytes: 8 %
NEUTROS ABS: 4.2 10*3/uL (ref 1.4–7.0)
Neutrophils: 63 %
PLATELETS: 216 10*3/uL (ref 150–379)
RBC: 4.5 x10E6/uL (ref 3.77–5.28)
RDW: 13.6 % (ref 12.3–15.4)
WBC: 6.8 10*3/uL (ref 3.4–10.8)

## 2017-05-18 LAB — HEPATIC FUNCTION PANEL
ALT: 12 IU/L (ref 0–32)
AST: 18 IU/L (ref 0–40)
Albumin: 4 g/dL (ref 3.6–4.8)
Alkaline Phosphatase: 59 IU/L (ref 39–117)
Bilirubin Total: 0.4 mg/dL (ref 0.0–1.2)
Bilirubin, Direct: 0.13 mg/dL (ref 0.00–0.40)
TOTAL PROTEIN: 6 g/dL (ref 6.0–8.5)

## 2017-05-18 LAB — LIPID PANEL
Chol/HDL Ratio: 2.4 ratio (ref 0.0–4.4)
Cholesterol, Total: 141 mg/dL (ref 100–199)
HDL: 58 mg/dL (ref >39)
LDL Calculated: 72 mg/dL (ref 0–99)
Triglycerides: 54 mg/dL (ref 0–149)
VLDL CHOLESTEROL CAL: 11 mg/dL (ref 5–40)

## 2017-05-18 LAB — BMP8+EGFR
BUN / CREAT RATIO: 27 (ref 12–28)
BUN: 17 mg/dL (ref 8–27)
CALCIUM: 9 mg/dL (ref 8.7–10.3)
CO2: 27 mmol/L (ref 20–29)
Chloride: 105 mmol/L (ref 96–106)
Creatinine, Ser: 0.64 mg/dL (ref 0.57–1.00)
GFR, EST AFRICAN AMERICAN: 108 mL/min/{1.73_m2} (ref >59)
GFR, EST NON AFRICAN AMERICAN: 93 mL/min/{1.73_m2} (ref >59)
Glucose: 81 mg/dL (ref 65–99)
POTASSIUM: 4.5 mmol/L (ref 3.5–5.2)
Sodium: 146 mmol/L — ABNORMAL HIGH (ref 134–144)

## 2017-05-18 LAB — VITAMIN D 25 HYDROXY (VIT D DEFICIENCY, FRACTURES): Vit D, 25-Hydroxy: 51.1 ng/mL (ref 30.0–100.0)

## 2017-05-25 ENCOUNTER — Ambulatory Visit (HOSPITAL_COMMUNITY)
Admission: RE | Admit: 2017-05-25 | Discharge: 2017-05-25 | Disposition: A | Discharge status: Home / Self Care | Payer: PPO | Source: Ambulatory Visit | Attending: Family Medicine | Admitting: Family Medicine

## 2017-05-25 DIAGNOSIS — R1032 Left lower quadrant pain: Secondary | ICD-10-CM | POA: Diagnosis not present

## 2017-05-25 DIAGNOSIS — R198 Other specified symptoms and signs involving the digestive system and abdomen: Secondary | ICD-10-CM | POA: Insufficient documentation

## 2017-05-25 DIAGNOSIS — Z9049 Acquired absence of other specified parts of digestive tract: Secondary | ICD-10-CM | POA: Diagnosis not present

## 2017-06-06 ENCOUNTER — Other Ambulatory Visit: Payer: Self-pay | Admitting: Family Medicine

## 2017-07-05 ENCOUNTER — Other Ambulatory Visit: Payer: Self-pay | Admitting: *Deleted

## 2017-07-05 MED ORDER — LISINOPRIL-HYDROCHLOROTHIAZIDE 20-12.5 MG PO TABS: 1.0000 | ORAL_TABLET | Freq: Every day | ORAL | 0 refills | Status: DC

## 2017-07-19 ENCOUNTER — Other Ambulatory Visit: Payer: Self-pay | Admitting: Family Medicine

## 2017-08-03 ENCOUNTER — Encounter: Payer: Self-pay | Admitting: Family

## 2017-08-03 ENCOUNTER — Ambulatory Visit (INDEPENDENT_AMBULATORY_CARE_PROVIDER_SITE_OTHER): Payer: PPO | Admitting: Family

## 2017-08-03 VITALS — BP 129/74 | HR 62 | Temp 97.1°F | Ht 65.0 in | Wt 229.0 lb

## 2017-08-03 DIAGNOSIS — W5503XA Scratched by cat, initial encounter: Secondary | ICD-10-CM | POA: Diagnosis not present

## 2017-08-03 DIAGNOSIS — R609 Edema, unspecified: Secondary | ICD-10-CM | POA: Diagnosis not present

## 2017-08-03 MED ORDER — FUROSEMIDE 20 MG PO TABS: 20.0000 mg | ORAL_TABLET | Freq: Every day | ORAL | 3 refills | Status: DC

## 2017-08-03 MED ORDER — AMOXICILLIN-POT CLAVULANATE 875-125 MG PO TABS: 1.0000 | ORAL_TABLET | Freq: Two times a day (BID) | ORAL | 0 refills | Status: DC

## 2017-08-03 MED ORDER — LISINOPRIL 20 MG PO TABS
20.0000 mg | ORAL_TABLET | Freq: Every day | ORAL | 3 refills | Status: DC
Start: 2017-08-03 — End: 2018-10-05

## 2017-08-03 NOTE — Progress Notes (Signed)
   Subjective:    Patient ID: Maia PettiesNancy E Lacomb, female    DOB: July 26, 1951, 66 y.o.   MRN: [id number]  HPI Pt presents to the office today with a cat scratch on right lower leg. Pt states last night when she got up to use the restroom in the middle of the night she hit the cat and he scratched the back of right lower leg.   Pt has hx of cellulitis in this leg. Pt denies any redness, bleeding, fever, or tenderness. Pt states she is having a constant clear fluid coming from the area.    Review of Systems  Skin: Positive for wound.  All other systems reviewed and are negative.      Objective:   Physical Exam  Constitutional: She is oriented to person, place, and time. She appears well-developed and well-nourished. No distress.  HENT:  Head: Normocephalic.  Mouth/Throat: No oropharyngeal exudate.  Eyes: Pupils are equal, round, and reactive to light.  Neck: Normal range of motion. Neck supple. No thyromegaly present.  Cardiovascular: Normal rate, regular rhythm, normal heart sounds and intact distal pulses.  No murmur heard. Pulmonary/Chest: Effort normal and breath sounds normal. No respiratory distress. She has no wheezes.  Abdominal: Soft. Bowel sounds are normal. She exhibits no distension. There is no tenderness.  Musculoskeletal: Normal range of motion. She exhibits edema (trace BLE). She exhibits no tenderness.  Neurological: She is alert and oriented to person, place, and time.  Skin: Skin is warm and dry.  Small 0.6 cm cut on right lower posterior leg with constant weeping edema   Psychiatric: She has a normal mood and affect. Her behavior is normal. Judgment and thought content normal.  Vitals reviewed.     BP 129/74   Pulse 62   Temp (!) 97.1 F (36.2 C) (Oral)   Ht 5\' 5"  (1.651 m)   Wt 229 lb (103.9 kg)   BMI 38.11 kg/m      Assessment & Plan:  1. Cat scratch Keep clean and dry Change dressing BID Report any redness, fever, discharge or pain RTO prn of if area  becomes worse - amoxicillin-clavulanate (AUGMENTIN) 875-125 MG tablet; Take 1 tablet by mouth 2 (two) times daily.  Dispense: 14 tablet; Refill: 0  2. Peripheral edema Will stop HCTZ 12.5 mg today and start lasix today Keep legs elevated when possible  Compression hose - furosemide (LASIX) 20 MG tablet; Take 1 tablet (20 mg total) by mouth daily.  Dispense: 30 tablet; Refill: 3   Keep all follow up appts with PCP  Jannifer Rodneyhristy Omah Dewalt, FNP

## 2017-08-03 NOTE — Patient Instructions (Signed)
Cat-Scratch Disease Cat-scratch disease is a rare infection that can be passed to people through the scratch or bite of an infected cat. The infection causes a red bump at the site of the bite or scratch. It may also cause swollen lymph glands and other symptoms. In most cases, the infection is mild and does not cause serious problems. However, a more severe infection can develop in people who have other illnesses or problems that weaken their body's defense system (immune system). What are the causes? This condition is caused by a type of bacteria called Bartonella henselae. These bacteria are present in the mouth or on the claws of cats. What are the signs or symptoms? Common symptoms of this condition include:  A red and sore pimple or bump-with or without pus-on the skin where the cat scratched or bit. The pimple or sore may be present for as long as 3 weeks after the scratch or bite occurred.  One or more enlarged lymph glands located toward the center of the body from where the injury occurred.  Other symptoms include:  Low-grade fever.  Tiredness and fatigue.  Headache.  Sore throat.  How is this diagnosed? This condition may be diagnosed based on your symptoms and history of a scratch or bite from a cat. Your health care provider will examine the skin sore and look for swollen lymph glands. You may also have tests, such as:  Culture tests of any fluid or pus from the injury site.  Blood tests.  Removal of a tissue sample from a swollen lymph gland (biopsy) to be looked at under a microscope. This may be done to confirm the diagnosis and to make sure that a different infection or disease is not causing your illness.  How is this treated? If the condition is mild, treatment may not be needed. You may be advised to take pain medicine and apply heat to the affected area. A more severe infection can be treated with antibiotic medicine. People who have immune system problems will  usually be treated with antibiotics because they are at risk for developing a severe infection. These include people who have HIV or AIDS, people who take medicines that may modify their immune system, and people who have had an organ transplant. Follow these instructions at home:  Rest until you feel better.  Take over-the-counter and prescription medicines only as told by your health care provider.  If you were prescribed an antibiotic medicine, take it as told by your health care provider. Do not stop taking the antibiotic even if you start to feel better.  Keep the area of the cat scratch clean. Wash it with soap and water, or apply an antiseptic solution such as povidone-iodine.  If directed, apply heat to the scratch area: ? Put a towel between your skin and a heat pack or heating pad. ? Leave the heat on for 20-30 minutes or as told by your health care provider. How is this prevented?  Avoid injury while playing with cats.  Wash well after playing with cats.  Do not let your cat lick sores on your body.  Do not let your cat roam around outside of your house. Contact a health care provider if:  You have increased redness, swelling, or pain at the site of the scratch.  You have fluid, blood, or pus coming from the scratch area.  You have a fever. Get help right away if:  You have increased swelling of your lymph glands.  You   develop pain in your abdomen.  You develop a skin rash.  You feel dizzy or you pass out.  You have a worsening headache.  You develop inflammation of your eye or have increasing vision problems.  You have pain in one of your bones.  You develop a stiff neck. This information is not intended to replace advice given to you by your health care provider. Make sure you discuss any questions you have with your health care provider. Document Released: 08/21/2000 Document Revised: 01/30/2016 Document Reviewed: 11/27/2014 Elsevier Interactive Patient  Education  2018 [employer]  

## 2017-08-31 ENCOUNTER — Other Ambulatory Visit: Payer: Self-pay | Admitting: Family Medicine

## 2017-09-01 NOTE — Telephone Encounter (Signed)
Last Vit D 51.1  DWM

## 2017-09-09 ENCOUNTER — Other Ambulatory Visit: Payer: Self-pay | Admitting: *Deleted

## 2017-09-09 MED ORDER — VITAMIN D (ERGOCALCIFEROL) 1.25 MG (50000 UNIT) PO CAPS: ORAL_CAPSULE | ORAL | 0 refills | Status: DC

## 2017-10-01 ENCOUNTER — Encounter: Payer: Self-pay | Admitting: Family Medicine

## 2017-10-01 ENCOUNTER — Ambulatory Visit (INDEPENDENT_AMBULATORY_CARE_PROVIDER_SITE_OTHER): Payer: PPO | Admitting: Family Medicine

## 2017-10-01 VITALS — BP 112/71 | HR 60 | Temp 96.9°F | Ht 65.0 in | Wt 231.4 lb

## 2017-10-01 DIAGNOSIS — M5136 Other intervertebral disc degeneration, lumbar region: Secondary | ICD-10-CM

## 2017-10-01 DIAGNOSIS — G4730 Sleep apnea, unspecified: Secondary | ICD-10-CM | POA: Diagnosis not present

## 2017-10-01 DIAGNOSIS — I10 Essential (primary) hypertension: Secondary | ICD-10-CM | POA: Diagnosis not present

## 2017-10-01 DIAGNOSIS — H6502 Acute serous otitis media, left ear: Secondary | ICD-10-CM | POA: Diagnosis not present

## 2017-10-01 DIAGNOSIS — Z1231 Encounter for screening mammogram for malignant neoplasm of breast: Secondary | ICD-10-CM | POA: Diagnosis not present

## 2017-10-01 DIAGNOSIS — E559 Vitamin D deficiency, unspecified: Secondary | ICD-10-CM

## 2017-10-01 DIAGNOSIS — E7849 Other hyperlipidemia: Secondary | ICD-10-CM

## 2017-10-01 MED ORDER — SIMVASTATIN 20 MG PO TABS: ORAL_TABLET | ORAL | 1 refills | Status: DC

## 2017-10-01 MED ORDER — AMOXICILLIN 500 MG PO CAPS: 500.0000 mg | ORAL_CAPSULE | Freq: Three times a day (TID) | ORAL | 0 refills | Status: DC

## 2017-10-01 MED ORDER — ALBUTEROL SULFATE HFA 108 (90 BASE) MCG/ACT IN AERS: 2.0000 | INHALATION_SPRAY | Freq: Four times a day (QID) | RESPIRATORY_TRACT | 2 refills | Status: DC | PRN

## 2017-10-01 MED ORDER — FLUTICASONE PROPIONATE 50 MCG/ACT NA SUSP: 2.0000 | Freq: Every day | NASAL | 6 refills | Status: DC

## 2017-10-01 NOTE — Addendum Note (Signed)
Addended by: Bearl MulberryUTHERFORD, NATALIE K on: 10/01/2017 10:22 AM   Modules accepted: Orders

## 2017-10-01 NOTE — Progress Notes (Signed)
Subjective:    Patient ID: Rhonda Daniel, female    DOB: [birth year], 67 y.o.   MRN: [id number]  HPI  Pt here for follow up of chronic medical conditions which include hypertension and hyperlipidemia.  The patient is doing well overall but does complain with some continued head congestion.  She is requesting refills on her simvastatin and Flonase.  Her body mass index is 38.  She is still in the morbid obese category because of 2 or more comorbid health conditions including hyperlipidemia and hypertension.  The weight today is up a couple of pounds since the previous visit.  Patient denies any chest pain.  She has has some shortness of breath with the congestion but this is improving.  She has had bronchitic symptoms for 3-4 weeks and this seems to keep waffling with being good and bad currently she feels like she is improving.  She is using Flonase.  She will get some nasal saline and use this during the day and also get some plain Mucinex maximum strength and take this for the congestion.  In the past she has had to use albuterol in a nebulizer but does not have any albuterol available currently.  She denies any nausea vomiting diarrhea blood in the stool or black tarry bowel movements.  Since her mother has passed she is eating more regularly and has become more active and her bowel movements are more normal and she does not have constipation.  She is passing her water without problems.  It is important to note that her sister died of cervical cancer and she has not had a pelvic exam and a good while.  She is scheduled for her mammogram in March.  She is not due her next eye exam until the fall of this year.    Review of Systems  Constitutional: Positive for fatigue (slight).  HENT: Positive for ear pain (L, slight) and postnasal drip.   Respiratory: Positive for cough (slight).   Gastrointestinal: Negative.   Genitourinary: Negative.   Psychiatric/Behavioral: Negative.        Objective:   Physical Exam  Constitutional: She is oriented to person, place, and time. She appears well-developed and well-nourished. No distress.  The patient is pleasant and alert and just beginning to recuperate from her care giving role of taking care of her mother.  HENT:  Head: Normocephalic and atraumatic.  Right Ear: External ear normal.  Left Ear: External ear normal.  Mouth/Throat: Oropharynx is clear and moist.  Nasal congestion bilaterally and thickened left TM  Eyes: Conjunctivae and EOM are normal. Pupils are equal, round, and reactive to light. Right eye exhibits no discharge. Left eye exhibits no discharge. No scleral icterus.  Neck: Normal range of motion. Neck supple. No thyromegaly present.  Cardiovascular: Normal rate, regular rhythm, normal heart sounds and intact distal pulses.  No murmur heard. Heart is regular at 60/min pulses were somewhat diminished in the left lower extremity.  Pulmonary/Chest: Effort normal and breath sounds normal. No respiratory distress. She has no wheezes. She has no rales.  Chest is clear anteriorly and posteriorly with a dry cough.  Abdominal: Soft. Bowel sounds are normal. She exhibits no mass. There is no tenderness. There is no rebound and no guarding.  Abdominal obesity without masses tenderness or organ enlargement or bruits  Genitourinary:  Genitourinary Comments: The patient will be scheduled for a pelvic exam as she is past due on this and there is a positive history in that her  sister died of cervical cancer.  Musculoskeletal: Normal range of motion. She exhibits no edema.  Still some back pain and rigidity.  Lymphadenopathy:    She has no cervical adenopathy.  Neurological: She is alert and oriented to person, place, and time. She has normal reflexes. No cranial nerve deficit.  Skin: Skin is warm and dry. No rash noted.  Psychiatric: She has a normal mood and affect. Her behavior is normal. Judgment and thought content normal.  Nursing note  and vitals reviewed.  BP 112/71 (BP Location: Left Arm, Patient Position: Sitting, Cuff Size: Normal)   Pulse 60   Temp (!) 96.9 F (36.1 C) (Oral)   Ht 5\' 5"  (1.651 m)   Wt 231 lb 6.4 oz (105 kg)   BMI 38.51 kg/m         Assessment & Plan:  1. Other hyperlipidemia -Continue current treatment pending results of lab work - Hepatic function panel - Lipid panel  2. Essential hypertension -Continue current treatment sodium restriction and weight loss - Basic Metabolic Panel - CBC with Differential/Platelet  3. Morbid obesity ([facility]) -Continue with weight watchers and as aggressive therapeutic lifestyle changes as possible  4. Degenerative disc disease, lumbar -Continue to walk regularly and did not put herself at risk for falling by avoiding climbing and not being in a hurry.  5. Sleep apnea, unspecified type -Consider getting sleep apnea evaluation  6. Vitamin D deficiency -Continue with vitamin D replacement pending results of lab work  7. Acute serous otitis media of left ear, recurrence not specified -Amoxicillin 503 times daily -Continue with Flonase -Use nasal saline frequently during the day -Take Mucinex maximum strength 1 twice daily for cough and congestion with a large glass of water -Use albuterol inhaler as needed as a rescue inhaler for bronchospasm and cough  Meds ordered this encounter  Medications  . fluticasone (FLONASE) 50 MCG/ACT nasal spray    Sig: Place 2 sprays into both nostrils daily.    Dispense:  16 g    Refill:  6  . simvastatin (ZOCOR) 20 MG tablet    Sig: TAKE 1 TABLET (20 MG TOTAL) BY MOUTH AT BEDTIME.    Dispense:  90 tablet    Refill:  1   Patient Instructions  Continue with as aggressive therapeutic lifestyle changes as possible which include diet and exercise Continue with current treatment. Remember to use Mucinex and nasal saline frequently as these can loosen congestion and keep your airways more open. Keep the house as cool  as possible and use cool mist humidification especially in the winter months when the heat is running. Take Mucinex maximum strength, blue and white in color and take 1 twice daily for cough and congestion Use the albuterol rescue inhaler 1 puff 4 times daily if needed for bronchospasm and chest tightness and persistent cough Take the amoxicillin regularly until completed  Nyra Capeson W. Damyiah Moxley MD

## 2017-10-01 NOTE — Patient Instructions (Addendum)
Continue with as aggressive therapeutic lifestyle changes as possible which include diet and exercise Continue with current treatment. Remember to use Mucinex and nasal saline frequently as these can loosen congestion and keep your airways more open. Keep the house as cool as possible and use cool mist humidification especially in the winter months when the heat is running. Take Mucinex maximum strength, blue and white in color and take 1 twice daily for cough and congestion Use the albuterol rescue inhaler 1 puff 4 times daily if needed for bronchospasm and chest tightness and persistent cough Take the amoxicillin regularly until completed

## 2017-10-02 LAB — CBC WITH DIFFERENTIAL/PLATELET
BASOS ABS: 0 10*3/uL (ref 0.0–0.2)
Basos: 0 %
EOS (ABSOLUTE): 0.3 10*3/uL (ref 0.0–0.4)
Eos: 5 %
HEMATOCRIT: 42.2 % (ref 34.0–46.6)
Hemoglobin: 13.7 g/dL (ref 11.1–15.9)
Immature Grans (Abs): 0 10*3/uL (ref 0.0–0.1)
Immature Granulocytes: 0 %
LYMPHS ABS: 1.7 10*3/uL (ref 0.7–3.1)
Lymphs: 32 %
MCH: 30.2 pg (ref 26.6–33.0)
MCHC: 32.5 g/dL (ref 31.5–35.7)
MCV: 93 fL (ref 79–97)
MONOS ABS: 0.5 10*3/uL (ref 0.1–0.9)
Monocytes: 9 %
Neutrophils Absolute: 2.9 10*3/uL (ref 1.4–7.0)
Neutrophils: 54 %
PLATELETS: 194 10*3/uL (ref 150–379)
RBC: 4.53 x10E6/uL (ref 3.77–5.28)
RDW: 13.2 % (ref 12.3–15.4)
WBC: 5.3 10*3/uL (ref 3.4–10.8)

## 2017-10-02 LAB — LIPID PANEL
CHOL/HDL RATIO: 2.5 ratio (ref 0.0–4.4)
Cholesterol, Total: 133 mg/dL (ref 100–199)
HDL: 54 mg/dL (ref >39)
LDL CALC: 68 mg/dL (ref 0–99)
TRIGLYCERIDES: 56 mg/dL (ref 0–149)
VLDL Cholesterol Cal: 11 mg/dL (ref 5–40)

## 2017-10-02 LAB — BASIC METABOLIC PANEL
BUN / CREAT RATIO: 36 — AB (ref 12–28)
BUN: 24 mg/dL (ref 8–27)
CHLORIDE: 103 mmol/L (ref 96–106)
CO2: 28 mmol/L (ref 20–29)
Calcium: 9.1 mg/dL (ref 8.7–10.3)
Creatinine, Ser: 0.67 mg/dL (ref 0.57–1.00)
GFR calc Af Amer: 105 mL/min/{1.73_m2} (ref >59)
GFR calc non Af Amer: 91 mL/min/{1.73_m2} (ref >59)
GLUCOSE: 85 mg/dL (ref 65–99)
POTASSIUM: 4.6 mmol/L (ref 3.5–5.2)
SODIUM: 142 mmol/L (ref 134–144)

## 2017-10-02 LAB — HEPATIC FUNCTION PANEL
ALBUMIN: 4 g/dL (ref 3.6–4.8)
ALT: 11 IU/L (ref 0–32)
AST: 15 IU/L (ref 0–40)
Alkaline Phosphatase: 56 IU/L (ref 39–117)
Bilirubin Total: 0.3 mg/dL (ref 0.0–1.2)
Bilirubin, Direct: 0.1 mg/dL (ref 0.00–0.40)
Total Protein: 6.4 g/dL (ref 6.0–8.5)

## 2017-11-29 ENCOUNTER — Other Ambulatory Visit: Payer: Self-pay | Admitting: Family Medicine

## 2017-12-28 ENCOUNTER — Ambulatory Visit (INDEPENDENT_AMBULATORY_CARE_PROVIDER_SITE_OTHER): Payer: PPO | Admitting: Nurse Practitioner

## 2017-12-28 ENCOUNTER — Encounter: Payer: Self-pay | Admitting: Nurse Practitioner

## 2017-12-28 VITALS — BP 145/76 | HR 59 | Temp 97.0°F | Ht 65.0 in | Wt 238.0 lb

## 2017-12-28 DIAGNOSIS — Z01419 Encounter for gynecological examination (general) (routine) without abnormal findings: Secondary | ICD-10-CM

## 2017-12-28 NOTE — Addendum Note (Signed)
Addended by: Margorie JohnJOHNSON, Messiyah Waterson M on: 12/28/2017 11:56 AM   Modules accepted: Kipp BroodSmartSet

## 2017-12-28 NOTE — Addendum Note (Signed)
Addended by: Bennie PieriniMARTIN, MARY-MARGARET on: 12/28/2017 04:05 PM   Modules accepted: Orders

## 2017-12-28 NOTE — Patient Instructions (Signed)

## 2017-12-28 NOTE — Progress Notes (Signed)
   Subjective:    Patient ID: Rhonda Daniel, female    DOB: 21-Sep-1950, 67 y.o.   MRN: [id number]  HPI This is a regular patient of Dr.Moore that was seen for her regular folow up on 10/01/17. He has sent her to me today for PAP and breast exam. She is doing well today without complaints.    Review of Systems  Constitutional: Negative for activity change and appetite change.  HENT: Negative.   Eyes: Negative for pain.  Respiratory: Negative for shortness of breath.   Cardiovascular: Negative for chest pain, palpitations and leg swelling.  Gastrointestinal: Negative for abdominal pain.  Endocrine: Negative for polydipsia.  Genitourinary: Negative.   Skin: Negative for rash.  Neurological: Negative for dizziness, weakness and headaches.  Hematological: Does not bruise/bleed easily.  Psychiatric/Behavioral: Negative.   All other systems reviewed and are negative.      Objective:   Physical Exam  Constitutional: She is oriented to person, place, and time. She appears well-developed and well-nourished.  HENT:  Head: Normocephalic.  Right Ear: Hearing, tympanic membrane, external ear and ear canal normal.  Left Ear: Hearing, tympanic membrane, external ear and ear canal normal.  Nose: Nose normal.  Mouth/Throat: Uvula is midline and oropharynx is clear and moist.  Eyes: Pupils are equal, round, and reactive to light. Conjunctivae and EOM are normal.  Neck: Trachea normal, normal range of motion and full passive range of motion without pain. Neck supple. No JVD present. Carotid bruit is not present. No thyroid mass and no thyromegaly present.  Cardiovascular: Normal rate, regular rhythm, normal heart sounds and intact distal pulses. Exam reveals no gallop and no friction rub.  No murmur heard. Pulmonary/Chest: Effort normal and breath sounds normal. Right breast exhibits no inverted nipple, no mass, no nipple discharge, no skin change and no tenderness. Left breast exhibits no inverted  nipple, no mass, no nipple discharge, no skin change and no tenderness. No breast swelling, tenderness, discharge or bleeding.  Abdominal: Soft. Bowel sounds are normal. She exhibits no distension and no mass. There is no tenderness.  Genitourinary: Vagina normal and uterus normal.  Genitourinary Comments: bimanual exam-No adnexal masses or tenderness. Cervix nonparous and pink  Musculoskeletal: Normal range of motion.  Lymphadenopathy:    She has no cervical adenopathy.  Neurological: She is alert and oriented to person, place, and time. She has normal reflexes.  Skin: Skin is warm and dry.  Psychiatric: She has a normal mood and affect. Her behavior is normal. Judgment and thought content normal.   BP (!) 145/76   Pulse (!) 59   Temp (!) 97 F (36.1 C) (Oral)   Ht 5\' 5"  (1.651 m)   Wt 238 lb (108 kg)   BMI 39.61 kg/m         Assessment & Plan:   1. Gynecologic exam normal    Keep follow up appointments with dr. Christell ConstantMoore Diet and exercise encouraged  Mary-Margaret Daphine DeutscherMartin, FNP

## 2017-12-30 LAB — PAP IG (IMAGE GUIDED): PAP SMEAR COMMENT: 0

## 2018-01-05 ENCOUNTER — Other Ambulatory Visit (INDEPENDENT_AMBULATORY_CARE_PROVIDER_SITE_OTHER): Payer: PPO

## 2018-01-05 ENCOUNTER — Other Ambulatory Visit: Payer: Self-pay | Admitting: Family Medicine

## 2018-01-05 DIAGNOSIS — Z780 Asymptomatic menopausal state: Secondary | ICD-10-CM

## 2018-01-13 DIAGNOSIS — M85852 Other specified disorders of bone density and structure, left thigh: Secondary | ICD-10-CM | POA: Diagnosis not present

## 2018-02-02 ENCOUNTER — Ambulatory Visit: Payer: PPO | Admitting: Family Medicine

## 2018-02-03 ENCOUNTER — Ambulatory Visit: Payer: PPO | Admitting: Family Medicine

## 2018-02-24 ENCOUNTER — Other Ambulatory Visit: Payer: Self-pay | Admitting: Family Medicine

## 2018-02-24 NOTE — Telephone Encounter (Signed)
Last Vit D 05/17/17  51.1  DWM

## 2018-03-04 ENCOUNTER — Ambulatory Visit (INDEPENDENT_AMBULATORY_CARE_PROVIDER_SITE_OTHER): Payer: PPO | Admitting: Family Medicine

## 2018-03-04 ENCOUNTER — Encounter: Payer: Self-pay | Admitting: Family Medicine

## 2018-03-04 VITALS — BP 127/77 | HR 64 | Temp 96.9°F | Ht 65.0 in | Wt 249.0 lb

## 2018-03-04 DIAGNOSIS — E7849 Other hyperlipidemia: Secondary | ICD-10-CM

## 2018-03-04 DIAGNOSIS — R011 Cardiac murmur, unspecified: Secondary | ICD-10-CM

## 2018-03-04 DIAGNOSIS — E559 Vitamin D deficiency, unspecified: Secondary | ICD-10-CM

## 2018-03-04 DIAGNOSIS — I10 Essential (primary) hypertension: Secondary | ICD-10-CM

## 2018-03-04 MED ORDER — AMOXICILLIN 500 MG PO CAPS: 500.0000 mg | ORAL_CAPSULE | Freq: Three times a day (TID) | ORAL | 0 refills | Status: DC

## 2018-03-04 NOTE — Patient Instructions (Addendum)
Medicare Annual Wellness Visit  [place] and the medical providers at [facility] strive to bring you the best medical care.  In doing so we not only want to address your current medical conditions and concerns but also to detect new conditions early and prevent illness, disease and health-related problems.    Medicare offers a yearly Wellness Visit which allows our clinical staff to assess your need for preventative services including immunizations, lifestyle education, counseling to decrease risk of preventable diseases and screening for fall risk and other medical concerns.    This visit is provided free of charge (no copay) for all Medicare recipients. The clinical pharmacists at [facility] have begun to conduct these Wellness Visits which will also include a thorough review of all your medications.    As you primary medical provider recommend that you make an appointment for your Annual Wellness Visit if you have not done so already this year.  You may set up this appointment before you leave today or you may call back [phone] and schedule an appointment.  Please make sure when you call that you mention that you are scheduling your Annual Wellness Visit with the clinical pharmacist so that the appointment may be made for the proper length of time.     Continue current medications. Continue good therapeutic lifestyle changes which include good diet and exercise. Fall precautions discussed with patient. If an FOBT was given today- please return it to our front desk. If you are over 68 years old - you may need Prevnar 13 or the adult Pneumonia vaccine.  **Flu shots are available--- please call and schedule a FLU-CLINIC appointment**  After your visit with us today you will receive a survey in the mail or online from [employer] regarding your care with us. Please take a moment to fill this out. Your feedback is very  important to us as you can help us better understand your patient needs as well as improve your experience and satisfaction. WE CARE ABOUT YOU!!!   Schedule an appointment with the cardiologist that comes next door for good cardiac evaluation and also because of the murmur that was noted today on the exam When weight watchers gets established again please make arrangements to go see them so he can keep working on your weight Please call back in the next 2 to 3 weeks if the eye problem does not resolve completely after taking the antibiotics Continue to use warm wet compresses 20 minutes 3 or 4 times daily to the affected eye

## 2018-03-04 NOTE — Progress Notes (Signed)
Subjective:    Patient ID: Rhonda Daniel, female    DOB: April 23, 1951, 67 y.o.   MRN: [id number]  HPI Pt here for follow up and management of chronic medical problems which includes hyperlipidemia and hypertension. She is taking medication regularly.  She did have her pelvic exam.  She complains of a stye in the left side today.  She will be given an FOBT to return he will have lab work done.  Her vital signs are stable and her weight is up 11 pounds from the previous visit.  Her body mass index is 39.4 and with 2 or more comorbid conditions she is considered morbidly obese.  Patient today is pleasant as usual and mostly complains of the stye issue in the lower lid of the left eye.  She says it was much larger and has gotten smaller and was never painful.  She denies any chest pain pressure tightness or shortness of breath.  She denies any trouble with swallowing heartburn indigestion nausea vomiting diarrhea blood in the stool or black tarry bowel movements.  She is passing her water well.  She still has ongoing low back pain especially with arising and starting to walk.  This is been much worse since the fall 2 to 3 years ago.  She is also gained some weight since the weight watchers program that she was attended closed.  She understands there is another weight watchers program that may get started in [place].  She plans to join up with this and says that she needs that weekly encouragement that she gets when going there.    Patient Active Problem List   Diagnosis Date Noted  . Caregiver with fatigue 01/14/2017  . Hyperlipidemia 04/02/2015  . Morbid obesity ([place]) 08/06/2009  . ESSENTIAL HYPERTENSION, BENIGN 08/06/2009  . Sleep apnea 08/06/2009  . CARDIOVASCULAR FUNCTION STUDY, ABNORMAL 08/06/2009   Outpatient Encounter Medications as of 03/04/2018  Medication Sig  . albuterol (PROVENTIL HFA;VENTOLIN HFA) 108 (90 Base) MCG/ACT inhaler Inhale 2 puffs into the lungs every 6 (six) hours as needed  for wheezing or shortness of breath.  Marland Kitchen aspirin 81 MG tablet Take 81 mg by mouth daily.  . fluticasone (FLONASE) 50 MCG/ACT nasal spray Place 2 sprays into both nostrils daily.  Marland Kitchen lisinopril (PRINIVIL,ZESTRIL) 20 MG tablet Take 1 tablet (20 mg total) by mouth daily.  . simvastatin (ZOCOR) 20 MG tablet TAKE 1 TABLET (20 MG TOTAL) BY MOUTH AT BEDTIME.  Marland Kitchen Vitamin D, Ergocalciferol, (DRISDOL) 50000 units CAPS capsule TAKE ONE CAPSULE EVERY SEVEN DAYS  . [DISCONTINUED] furosemide (LASIX) 20 MG tablet Take 1 tablet (20 mg total) by mouth daily.   No facility-administered encounter medications on file as of 03/04/2018.      Review of Systems  Constitutional: Negative.   HENT: Negative.   Eyes: Negative.        Left eye stye  Respiratory: Negative.   Cardiovascular: Negative.   Gastrointestinal: Negative.   Endocrine: Negative.   Genitourinary: Negative.   Musculoskeletal: Negative.   Skin: Negative.   Allergic/Immunologic: Negative.   Neurological: Negative.   Hematological: Negative.   Psychiatric/Behavioral: Negative.        Objective:   Physical Exam  Constitutional: She is oriented to person, place, and time. She appears well-developed and well-nourished.  The patient is pleasant and alert as usual.  She is not a complainer.  HENT:  Head: Normocephalic and atraumatic.  Right Ear: External ear normal.  Left Ear: External ear normal.  Nose: Nose normal.  Mouth/Throat: Oropharynx is clear and moist. No oropharyngeal exudate.  Eyes: Pupils are equal, round, and reactive to light. Conjunctivae and EOM are normal. Right eye exhibits no discharge. Left eye exhibits no discharge. No scleral icterus.  Small discolored lesion that is brown in color and not red or inflamed with no drainage on the lower lid of the left eye.  I am not sure if this is a resolving stye or if it could be a lid lesion that had a stye connected to it.  Neck: Normal range of motion. Neck supple. No thyromegaly  present.  No bruits or thyromegaly  Cardiovascular: Normal rate, regular rhythm and intact distal pulses.  Murmur heard. The heart is 60/min with a regular rate and rhythm and a grade 2/6 systolic ejection murmur.  I have not heard this before not listen to her many times.  Pulmonary/Chest: Effort normal and breath sounds normal. She has no wheezes. She has no rales.  Clear anteriorly and posteriorly with good breath sounds  Abdominal: Soft. Bowel sounds are normal. She exhibits no mass. There is no tenderness.  Abdominal obesity without masses tenderness organ enlargement or bruit  Musculoskeletal: Normal range of motion. She exhibits edema. She exhibits no tenderness.  Ankle edema left foot that is mild  Lymphadenopathy:    She has no cervical adenopathy.  Neurological: She is alert and oriented to person, place, and time. She has normal reflexes. No cranial nerve deficit.  Skin: Skin is warm and dry. No rash noted.  Psychiatric: She has a normal mood and affect. Her behavior is normal. Judgment and thought content normal.  The patient's mood affect and behavior are normal for her.  Nursing note and vitals reviewed.   BP 127/77 (BP Location: Left Arm)   Pulse 64   Temp (!) 96.9 F (36.1 C) (Oral)   Ht '5\' 5"'  (1.651 m)   Wt 249 lb (112.9 kg)   BMI 41.44 kg/m        Assessment & Plan:  1. Other hyperlipidemia -Continue the blood pressure is good today and she will continue with current treatment pending results of lab work - Ambulatory referral to Cardiology - CBC with Differential/Platelet - Lipid panel  2. Essential hypertension -The blood pressure is good today and she will continue with her lisinopril and as needed furosemide. - Ambulatory referral to Cardiology - BMP8+EGFR - CBC with Differential/Platelet - Hepatic function panel  3. Vitamin D deficiency -Continue with current treatment pending results of lab work - CBC with Differential/Platelet - VITAMIN D 25  Hydroxy (Vit-D Deficiency, Fractures)  4. Morbid obesity ([place]) -Patient must do better with her weight status as she is put on weight since the last visit.  She attributes this to not being able to attend weight watchers which she plans to start doing again when a new program gets established in [place]. - CBC with Differential/Platelet  5. Systolic ejection murmur -We will refer her to the cardiologist for good evaluation and to evaluate the murmur in a patient with risk factors for heart disease. - Ambulatory referral to Cardiology - CBC with Differential/Platelet  Patient Instructions                       Medicare Annual Wellness Visit  [place] and the medical providers at [facility] strive to bring you the best medical care.  In doing so we not only want to address your current  medical conditions and concerns but also to detect new conditions early and prevent illness, disease and health-related problems.    Medicare offers a yearly Wellness Visit which allows our clinical staff to assess your need for preventative services including immunizations, lifestyle education, counseling to decrease risk of preventable diseases and screening for fall risk and other medical concerns.    This visit is provided free of charge (no copay) for all Medicare recipients. The clinical pharmacists at [facility] have begun to conduct these Wellness Visits which will also include a thorough review of all your medications.    As you primary medical provider recommend that you make an appointment for your Annual Wellness Visit if you have not done so already this year.  You may set up this appointment before you leave today or you may call back [phone]) and schedule an appointment.  Please make sure when you call that you mention that you are scheduling your Annual Wellness Visit with the clinical pharmacist so that the appointment may be made for the proper  length of time.     Continue current medications. Continue good therapeutic lifestyle changes which include good diet and exercise. Fall precautions discussed with patient. If an FOBT was given today- please return it to our front desk. If you are over 29 years old - you may need Prevnar 31 or the adult Pneumonia vaccine.  **Flu shots are available--- please call and schedule a FLU-CLINIC appointment**  After your visit with [place] today you will receive a survey in the mail or online from [employer] regarding your care with [place]. Please take a moment to fill this out. Your feedback is very important to [place] as you can help [place] better understand your patient needs as well as improve your experience and satisfaction. WE CARE ABOUT YOU!!!   Schedule an appointment with the cardiologist that comes next door for good cardiac evaluation and also because of the murmur that was noted today on the exam When weight watchers gets established again please make arrangements to go see them so he can keep working on your weight Please call back in the next 2 to 3 weeks if the eye problem does not resolve completely after taking the antibiotics Continue to use warm wet compresses 20 minutes 3 or 4 times daily to the affected eye  Arrie Senate MD

## 2018-03-05 LAB — BMP8+EGFR
BUN / CREAT RATIO: 30 — AB (ref 12–28)
BUN: 21 mg/dL (ref 8–27)
CO2: 27 mmol/L (ref 20–29)
CREATININE: 0.7 mg/dL (ref 0.57–1.00)
Calcium: 9.7 mg/dL (ref 8.7–10.3)
Chloride: 105 mmol/L (ref 96–106)
GFR, EST AFRICAN AMERICAN: 104 mL/min/{1.73_m2} (ref >59)
GFR, EST NON AFRICAN AMERICAN: 90 mL/min/{1.73_m2} (ref >59)
Glucose: 78 mg/dL (ref 65–99)
Potassium: 4.5 mmol/L (ref 3.5–5.2)
Sodium: 145 mmol/L — ABNORMAL HIGH (ref 134–144)

## 2018-03-05 LAB — LIPID PANEL
CHOL/HDL RATIO: 2.4 ratio (ref 0.0–4.4)
Cholesterol, Total: 141 mg/dL (ref 100–199)
HDL: 59 mg/dL (ref >39)
LDL CALC: 69 mg/dL (ref 0–99)
Triglycerides: 64 mg/dL (ref 0–149)
VLDL CHOLESTEROL CAL: 13 mg/dL (ref 5–40)

## 2018-03-05 LAB — CBC WITH DIFFERENTIAL/PLATELET
BASOS ABS: 0 10*3/uL (ref 0.0–0.2)
BASOS: 1 %
EOS (ABSOLUTE): 0.3 10*3/uL (ref 0.0–0.4)
Eos: 4 %
HEMATOCRIT: 42.1 % (ref 34.0–46.6)
HEMOGLOBIN: 13.9 g/dL (ref 11.1–15.9)
Immature Grans (Abs): 0 10*3/uL (ref 0.0–0.1)
Immature Granulocytes: 0 %
LYMPHS ABS: 2.1 10*3/uL (ref 0.7–3.1)
Lymphs: 30 %
MCH: 30.1 pg (ref 26.6–33.0)
MCHC: 33 g/dL (ref 31.5–35.7)
MCV: 91 fL (ref 79–97)
MONOCYTES: 11 %
Monocytes Absolute: 0.7 10*3/uL (ref 0.1–0.9)
NEUTROS ABS: 3.9 10*3/uL (ref 1.4–7.0)
Neutrophils: 54 %
Platelets: 232 10*3/uL (ref 150–450)
RBC: 4.62 x10E6/uL (ref 3.77–5.28)
RDW: 13.5 % (ref 12.3–15.4)
WBC: 7.1 10*3/uL (ref 3.4–10.8)

## 2018-03-05 LAB — HEPATIC FUNCTION PANEL
ALT: 16 IU/L (ref 0–32)
AST: 22 IU/L (ref 0–40)
Albumin: 4 g/dL (ref 3.6–4.8)
Alkaline Phosphatase: 62 IU/L (ref 39–117)
Bilirubin Total: 0.4 mg/dL (ref 0.0–1.2)
Bilirubin, Direct: 0.12 mg/dL (ref 0.00–0.40)
TOTAL PROTEIN: 6.4 g/dL (ref 6.0–8.5)

## 2018-03-05 LAB — VITAMIN D 25 HYDROXY (VIT D DEFICIENCY, FRACTURES): VIT D 25 HYDROXY: 49.9 ng/mL (ref 30.0–100.0)

## 2018-04-05 ENCOUNTER — Other Ambulatory Visit: Payer: Self-pay | Admitting: Family Medicine

## 2018-04-17 NOTE — Progress Notes (Signed)
Cardiology Office Note   Date:  04/18/2018   ID:  Rhonda Skyeancy E Gibler, DOB 03-Dec-1950, MRN [id number]  PCP:  Ernestina PennaMoore, Donald W, MD  Cardiologist:   No primary care provider on file. Referring:  Ernestina PennaMoore, Donald W, MD   Chief Complaint  Patient presents with  . Heart Murmur      History of Present Illness: Rhonda Daniel is a 67 y.o. female who is referred by Ernestina PennaMoore, Donald W, MD for evaluation of a heart murmurHer cardiac work up in the past includes a POET (Plain Old Exercise Treadmill) years ago. Office records from June 2019 report a 2/6 systolic ejection murmur. She is asymptomatic. She is able to complete all her ADLs with no chest pressure, tightness, or pain. No new shortness of breath, PND, orthopnea, or leg swelling. She has lost 60 lbs recently with Weight Watchers.     Past Medical History:  Diagnosis Date  . Anxiety   . Hyperlipidemia   . Hypertension   . Sleep apnea    Was on CPAP.   Does not use now.     Past Surgical History:  Procedure Laterality Date  . ABDOMINAL HYSTERECTOMY    . APPENDECTOMY     with malignancy of appendix  . CHOLECYSTECTOMY    . COLON SURGERY     preventative - with appendix malignancy  . HERNIA REPAIR       Current Outpatient Medications  Medication Sig Dispense Refill  . albuterol (PROVENTIL HFA;VENTOLIN HFA) 108 (90 Base) MCG/ACT inhaler Inhale 2 puffs into the lungs every 6 (six) hours as needed for wheezing or shortness of breath. 1 Inhaler 2  . aspirin 81 MG tablet Take 81 mg by mouth daily.    . fluticasone (FLONASE) 50 MCG/ACT nasal spray Place 2 sprays into both nostrils daily. 16 g 6  . lisinopril (PRINIVIL,ZESTRIL) 20 MG tablet Take 1 tablet (20 mg total) by mouth daily. 90 tablet 3  . simvastatin (ZOCOR) 20 MG tablet TAKE 1 TABLET (20 MG TOTAL) BY MOUTH AT BEDTIME. 90 tablet 1  . Vitamin D, Ergocalciferol, (DRISDOL) 50000 units CAPS capsule TAKE ONE CAPSULE EVERY SEVEN DAYS 12 capsule 0   No current facility-administered  medications for this visit.     Allergies:   Bee venom    Social History:  The patient  reports that she has never smoked. She has never used smokeless tobacco. She reports that she does not drink alcohol or use drugs.   Family History:  The patient's family history includes Cancer in her paternal aunt; Cancer - Cervical in her sister; Diabetes in her maternal grandmother and mother; Hearing loss in her mother; Heart disease in her paternal aunt and paternal grandfather; Hypertension in her mother; Irregular heart beat in her mother; Pneumonia in her father; Rheumatic fever in her father; Vision loss in her maternal grandmother and mother.    ROS:  Please see the history of present illness.   Otherwise, review of systems are positive for ankle swelling.   All other systems are reviewed and negative.    PHYSICAL EXAM: VS:  BP (!) 150/88   Pulse 66   Ht 5\' 5"  (1.651 m)   Wt 255 lb 3.2 oz (115.8 kg)   BMI 42.47 kg/m  , BMI Body mass index is 42.47 kg/m. GENERAL:  Well appearing HEENT:  Pupils equal round and reactive, fundi not visualized, oral mucosa unremarkable NECK:  No jugular venous distention, waveform within normal limits, carotid upstroke brisk  and symmetric, no bruits, no thyromegaly LYMPHATICS:  No cervical, inguinal adenopathy LUNGS:  Clear to auscultation bilaterally BACK:  No CVA tenderness CHEST:  Unremarkable HEART:  PMI not displaced or sustained,S1 and S2 within normal limits, no S3, no S4, no clicks, no rubs, 3/6 systolic murmurs. ABD:  Flat, positive bowel sounds normal in frequency in pitch, no bruits, no rebound, no guarding, no midline pulsatile mass, no hepatomegaly, no splenomegaly EXT:  2 plus pulses throughout, no edema, no cyanosis no clubbing SKIN:  No rashes no nodules NEURO:  Cranial nerves II through XII grossly intact, motor grossly intact throughout PSYCH:  Cognitively intact, oriented to person place and time    EKG:  EKG is ordered today. The  ekg ordered today demonstrates sinus rhythm with PACs, normal axis, normal intervals, poor R wave progression.    Recent Labs: 03/04/2018: ALT 16; BUN 21; Creatinine, Ser 0.70; Hemoglobin 13.9; Platelets 232; Potassium 4.5; Sodium 145    Lipid Panel    Component Value Date/Time   CHOL 141 03/04/2018 1550   TRIG 64 03/04/2018 1550   TRIG 112 11/21/2014 1026   HDL 59 03/04/2018 1550   HDL 48 11/21/2014 1026   CHOLHDL 2.4 03/04/2018 1550   LDLCALC 69 03/04/2018 1550      Wt Readings from Last 3 Encounters:  04/18/18 255 lb 3.2 oz (115.8 kg)  03/04/18 249 lb (112.9 kg)  12/28/17 238 lb (108 kg)      Other studies Reviewed: Additional studies/ records that were reviewed today include: office records Review of the above records demonstrates: 2/6 ejection systolic murmur  Please see elsewhere in the note.     ASSESSMENT AND PLAN:  MURMUR:  Do not feel the need for echocardiogram as this is consistent with aortic sclerosis.   HTN:  Controlled with lifestyle changes as well as current medical therapy. No changes are needed.   DYSLIPIDEMIA:  Well controlled with current medical therapy. No changes are needed.    Current medicines are reviewed at length with the patient today.  The patient does not have concerns regarding medicines.  The following changes have been made:  no change  Labs/ tests ordered today include: EKG  Orders Placed This Encounter  Procedures  . EKG 12-Lead     Disposition:   FU with me prn.    Signed, Rollene Rotunda, MD  04/18/2018 1:05 PM    [facility]

## 2018-04-18 ENCOUNTER — Ambulatory Visit (INDEPENDENT_AMBULATORY_CARE_PROVIDER_SITE_OTHER): Payer: PPO | Admitting: Cardiology

## 2018-04-18 ENCOUNTER — Encounter: Payer: Self-pay | Admitting: Cardiology

## 2018-04-18 VITALS — BP 150/88 | HR 66 | Ht 65.0 in | Wt 255.2 lb

## 2018-04-18 DIAGNOSIS — I10 Essential (primary) hypertension: Secondary | ICD-10-CM

## 2018-04-18 DIAGNOSIS — R011 Cardiac murmur, unspecified: Secondary | ICD-10-CM | POA: Diagnosis not present

## 2018-04-18 NOTE — Patient Instructions (Signed)
Medication Instructions:  Continue current medications  If you need a refill on your cardiac medications before your next appointment, please call your pharmacy.  Labwork: None Ordered  Testing/Procedures: None Ordered  Follow-Up: Your physician wants you to follow-up in: As Needed.      Thank you for choosing [facility]!!       

## 2018-05-19 ENCOUNTER — Other Ambulatory Visit: Payer: Self-pay | Admitting: Nurse Practitioner

## 2018-07-06 ENCOUNTER — Ambulatory Visit: Payer: PPO | Admitting: Family Medicine

## 2018-08-08 ENCOUNTER — Other Ambulatory Visit: Payer: Self-pay | Admitting: Family Medicine

## 2018-09-13 ENCOUNTER — Other Ambulatory Visit: Payer: PPO

## 2018-09-13 DIAGNOSIS — E559 Vitamin D deficiency, unspecified: Secondary | ICD-10-CM

## 2018-09-13 DIAGNOSIS — E7849 Other hyperlipidemia: Secondary | ICD-10-CM | POA: Diagnosis not present

## 2018-09-13 DIAGNOSIS — I10 Essential (primary) hypertension: Secondary | ICD-10-CM | POA: Diagnosis not present

## 2018-09-14 ENCOUNTER — Ambulatory Visit (INDEPENDENT_AMBULATORY_CARE_PROVIDER_SITE_OTHER): Payer: PPO

## 2018-09-14 ENCOUNTER — Encounter: Payer: Self-pay | Admitting: Family Medicine

## 2018-09-14 ENCOUNTER — Ambulatory Visit (INDEPENDENT_AMBULATORY_CARE_PROVIDER_SITE_OTHER): Payer: PPO | Admitting: Family Medicine

## 2018-09-14 VITALS — BP 137/82 | HR 56 | Temp 97.2°F | Ht 65.0 in | Wt 256.0 lb

## 2018-09-14 DIAGNOSIS — G4730 Sleep apnea, unspecified: Secondary | ICD-10-CM | POA: Diagnosis not present

## 2018-09-14 DIAGNOSIS — E7849 Other hyperlipidemia: Secondary | ICD-10-CM

## 2018-09-14 DIAGNOSIS — E559 Vitamin D deficiency, unspecified: Secondary | ICD-10-CM | POA: Diagnosis not present

## 2018-09-14 DIAGNOSIS — I10 Essential (primary) hypertension: Secondary | ICD-10-CM

## 2018-09-14 DIAGNOSIS — E785 Hyperlipidemia, unspecified: Secondary | ICD-10-CM | POA: Diagnosis not present

## 2018-09-14 LAB — CBC WITH DIFFERENTIAL/PLATELET
Basophils Absolute: 0 10*3/uL (ref 0.0–0.2)
Basos: 1 %
EOS (ABSOLUTE): 0.3 10*3/uL (ref 0.0–0.4)
EOS: 5 %
HEMATOCRIT: 44.9 % (ref 34.0–46.6)
HEMOGLOBIN: 14.7 g/dL (ref 11.1–15.9)
IMMATURE GRANULOCYTES: 0 %
Immature Grans (Abs): 0 10*3/uL (ref 0.0–0.1)
LYMPHS: 28 %
Lymphocytes Absolute: 1.6 10*3/uL (ref 0.7–3.1)
MCH: 29.9 pg (ref 26.6–33.0)
MCHC: 32.7 g/dL (ref 31.5–35.7)
MCV: 91 fL (ref 79–97)
MONOCYTES: 8 %
Monocytes Absolute: 0.5 10*3/uL (ref 0.1–0.9)
NEUTROS PCT: 58 %
Neutrophils Absolute: 3.5 10*3/uL (ref 1.4–7.0)
Platelets: 227 10*3/uL (ref 150–450)
RBC: 4.91 x10E6/uL (ref 3.77–5.28)
RDW: 13 % (ref 11.7–15.4)
WBC: 5.9 10*3/uL (ref 3.4–10.8)

## 2018-09-14 LAB — HEPATIC FUNCTION PANEL
ALT: 13 IU/L (ref 0–32)
AST: 19 IU/L (ref 0–40)
Albumin: 4.1 g/dL (ref 3.6–4.8)
Alkaline Phosphatase: 67 IU/L (ref 39–117)
Bilirubin Total: 0.5 mg/dL (ref 0.0–1.2)
Bilirubin, Direct: 0.13 mg/dL (ref 0.00–0.40)
Total Protein: 6.3 g/dL (ref 6.0–8.5)

## 2018-09-14 LAB — BMP8+EGFR
BUN/Creatinine Ratio: 25 (ref 12–28)
BUN: 19 mg/dL (ref 8–27)
CO2: 23 mmol/L (ref 20–29)
CREATININE: 0.76 mg/dL (ref 0.57–1.00)
Calcium: 9.4 mg/dL (ref 8.7–10.3)
Chloride: 104 mmol/L (ref 96–106)
GFR calc Af Amer: 93 mL/min/{1.73_m2} (ref >59)
GFR calc non Af Amer: 81 mL/min/{1.73_m2} (ref >59)
GLUCOSE: 89 mg/dL (ref 65–99)
Potassium: 4.3 mmol/L (ref 3.5–5.2)
Sodium: 143 mmol/L (ref 134–144)

## 2018-09-14 LAB — LIPID PANEL
Chol/HDL Ratio: 2.7 ratio (ref 0.0–4.4)
Cholesterol, Total: 143 mg/dL (ref 100–199)
HDL: 53 mg/dL (ref >39)
LDL CALC: 74 mg/dL (ref 0–99)
Triglycerides: 79 mg/dL (ref 0–149)
VLDL Cholesterol Cal: 16 mg/dL (ref 5–40)

## 2018-09-14 LAB — VITAMIN D 25 HYDROXY (VIT D DEFICIENCY, FRACTURES): VIT D 25 HYDROXY: 60.2 ng/mL (ref 30.0–100.0)

## 2018-09-14 MED ORDER — FLUTICASONE PROPIONATE 50 MCG/ACT NA SUSP: 2.0000 | Freq: Every day | NASAL | 6 refills | Status: DC

## 2018-09-14 NOTE — Progress Notes (Signed)
Subjective:    Patient ID: Rhonda Daniel, female    DOB: Jan 13, 1951, 68 y.o.   MRN: [id number]  HPI Pt here for follow up and management of chronic medical problems which includes hypertension and hyperlipidemia. He is taking medication regularly.  Rhonda Daniel has had lab work done this will be reviewed with her during the visit today.  Her blood sugar creatinine and all electrolytes including potassium were good.  The CBC had a normal white blood cell count a good and improved hemoglobin at 14.7 and an adequate platelet count.  Cholesterol numbers with traditional lipid testing had a bad cholesterol that was good at 74 and similar to previous readings.  Triglycerides were good at 79 and a good cholesterol remains within normal limits at 53.  The vitamin D level was excellent at 60.2.  All liver function tests were within normal limits.  Patient does complain today of a recent fall and she is sore from this.  She will get a chest x-ray today and will schedule her mammogram as it is due soon by the end of this month.  She is requesting a refill on her Flonase.  Her weight is stable at 255 and her body mass index remains elevated above 40 at 42.47.  Patient has a history of anxiety sleep apnea hyperlipidemia and hypertension along with her morbid obesity.  Appendectomy cholecystectomy hernia repair and hysterectomy.  She has a brother that is still living and a sister that died of cervical cancer.  The patient is doing well overall other than this fall that occurred in her home a couple of days ago.  She fell or lost her balance and fell to her right side and hit her right hip and right knee.  She had to get her brother to help her get up out of the floor.  She says it is very sore but is able to flex and extend the hip without any problems and no problems with flexing the knee.  She denies any chest pain or shortness of breath trouble with her intestinal tract or any problems with voiding.  She denies any shortness  of breath anymore than usual.  She is passing her water without problems.    Patient Active Problem List   Diagnosis Date Noted  . Caregiver with fatigue 01/14/2017  . Hyperlipidemia 04/02/2015  . Morbid obesity (HCC) 08/06/2009  . ESSENTIAL HYPERTENSION, BENIGN 08/06/2009  . Sleep apnea 08/06/2009  . CARDIOVASCULAR FUNCTION STUDY, ABNORMAL 08/06/2009   Outpatient Encounter Medications as of 09/14/2018  Medication Sig  . aspirin 81 MG tablet Take 81 mg by mouth daily.  . fluticasone (FLONASE) 50 MCG/ACT nasal spray Place 2 sprays into both nostrils daily.  Marland Kitchen lisinopril (PRINIVIL,ZESTRIL) 20 MG tablet Take 1 tablet (20 mg total) by mouth daily.  . simvastatin (ZOCOR) 20 MG tablet TAKE 1 TABLET (20 MG TOTAL) BY MOUTH AT BEDTIME.  Marland Kitchen Vitamin D, Ergocalciferol, (DRISDOL) 1.25 MG (50000 UT) CAPS capsule TAKE ONE CAPSULE EVERY SEVEN DAYS  . albuterol (PROVENTIL HFA;VENTOLIN HFA) 108 (90 Base) MCG/ACT inhaler Inhale 2 puffs into the lungs every 6 (six) hours as needed for wheezing or shortness of breath. (Patient not taking: Reported on 09/14/2018)   No facility-administered encounter medications on file as of 09/14/2018.      Review of Systems  Constitutional: Negative.   HENT: Negative.   Eyes: Negative.   Respiratory: Negative.   Cardiovascular: Negative.   Gastrointestinal: Negative.   Endocrine: Negative.   Genitourinary:  Negative.   Musculoskeletal: Positive for myalgias (from fall on Sunday).  Skin: Negative.   Allergic/Immunologic: Negative.   Neurological: Negative.   Hematological: Negative.   Psychiatric/Behavioral: Negative.        Objective:   Physical Exam Vitals signs and nursing note reviewed.  Constitutional:      Appearance: Normal appearance. She is well-developed. She is obese.  HENT:     Head: Normocephalic and atraumatic.     Right Ear: Tympanic membrane, ear canal and external ear normal. There is no impacted cerumen.     Left Ear: Tympanic membrane, ear  canal and external ear normal. There is no impacted cerumen.     Nose: Congestion present.     Mouth/Throat:     Mouth: Mucous membranes are moist.     Pharynx: Oropharynx is clear. No oropharyngeal exudate or posterior oropharyngeal erythema.  Eyes:     General: No scleral icterus.       Right eye: No discharge.        Left eye: No discharge.     Conjunctiva/sclera: Conjunctivae normal.     Pupils: Pupils are equal, round, and reactive to light.     Comments: Eye exam is planned soon  Neck:     Musculoskeletal: Normal range of motion and neck supple.     Thyroid: No thyromegaly.     Vascular: No carotid bruit or JVD.     Comments: No bruits thyromegaly or anterior cervical adenopathy Cardiovascular:     Rate and Rhythm: Normal rate and regular rhythm.     Heart sounds: Normal heart sounds. No murmur.     Comments: The heart is regular at 60/min Pulmonary:     Effort: Pulmonary effort is normal. No respiratory distress.     Breath sounds: Normal breath sounds. No wheezing or rales.     Comments: Lungs are clear anteriorly and posteriorly Abdominal:     General: Bowel sounds are normal.     Palpations: Abdomen is soft. There is no mass.     Tenderness: There is no abdominal tenderness. There is no guarding or rebound.     Comments: Morbid obesity without masses tenderness or organ enlargement palpable.  No bruits  Musculoskeletal: Normal range of motion.        General: Deformity and signs of injury present. No tenderness.     Right lower leg: No edema.     Left lower leg: No edema.     Comments: Bruise right lateral hip with tenderness over this area.  Good flexion and extension and good flexion extension of right knee ,patient has varicosities both lower extremities and discoloration of both lower extremities from past bouts with cellulitis.  Lymphadenopathy:     Cervical: No cervical adenopathy.  Skin:    General: Skin is warm and dry.     Findings: No rash.  Neurological:       Mental Status: She is alert and oriented to person, place, and time. Mental status is at baseline.     Cranial Nerves: No cranial nerve deficit.     Gait: Gait normal.     Deep Tendon Reflexes: Reflexes are normal and symmetric.  Psychiatric:        Mood and Affect: Mood normal.        Behavior: Behavior normal.        Thought Content: Thought content normal.        Judgment: Judgment normal.     Comments: Mood affect and behavior  for this patient are all normal.     BP 137/82 (BP Location: Left Wrist)   Pulse (!) 56   Temp (!) 97.2 F (36.2 C) (Oral)   Ht 5\' 5"  (1.651 m)   Wt 256 lb (116.1 kg)   BMI 42.60 kg/m        Assessment & Plan:  1. Essential hypertension -The blood pressure is good today and she will continue with her lisinopril. - DG Chest 2 View; Future  2. Other hyperlipidemia -Continue with simvastatin and efforts to lose weight through weight watchers and diet and exercise with that. - DG Chest 2 View; Future  3. Vitamin D deficiency -Vitamin D level was good and she will continue with current treatment  4. Morbid obesity (HCC) -Continue to work aggressively to lose weight through diet and exercise and following the weight watchers diet  5. Sleep apnea, unspecified type -No complaints today with daytime fatigue.  6.  Allergic rhinitis -Refilled Flonase  Meds ordered this encounter  Medications  . fluticasone (FLONASE) 50 MCG/ACT nasal spray    Sig: Place 2 sprays into both nostrils daily.    Dispense:  16 g    Refill:  6   Patient Instructions                       Medicare Annual Wellness Visit  [place] and the medical providers at [facility] strive to bring you the best medical care.  In doing so we not only want to address your current medical conditions and concerns but also to detect new conditions early and prevent illness, disease and health-related problems.    Medicare offers a yearly Wellness Visit  which allows our clinical staff to assess your need for preventative services including immunizations, lifestyle education, counseling to decrease risk of preventable diseases and screening for fall risk and other medical concerns.    This visit is provided free of charge (no copay) for all Medicare recipients. The clinical pharmacists at [facility] have begun to conduct these Wellness Visits which will also include a thorough review of all your medications.    As you primary medical provider recommend that you make an appointment for your Annual Wellness Visit if you have not done so already this year.  You may set up this appointment before you leave today or you may call back [phone]) and schedule an appointment.  Please make sure when you call that you mention that you are scheduling your Annual Wellness Visit with the clinical pharmacist so that the appointment may be made for the proper length of time.     Continue current medications. Continue good therapeutic lifestyle changes which include good diet and exercise. Fall precautions discussed with patient. If an FOBT was given today- please return it to our front desk. If you are over 69 years old - you may need Prevnar 13 or the adult Pneumonia vaccine.  **Flu shots are available--- please call and schedule a FLU-CLINIC appointment**  After your visit with us today you will receive a survey in the mail or online from [employer] regarding your care with us. Please take a moment to fill this out. Your feedback is very important to us as you can help us better understand your patient needs as well as improve your experience and satisfaction. WE CARE ABOUT YOU!!!   Continue to follow-up with weight watchers and make all efforts to lose weight through diet and  exercise Try to be more careful with movements in the house look where you are walking so as not to stumble and fall Do not be in a hurry Do not forget to get  your eye exam and get your mammogram as planned We will call with results of chest x-ray and the FOBT as soon as it is returned It also looks like that you may need to get a colonoscopy this spring. Get support hose for helping lower extremity edema and varicosities Use nasal saline for nasal congestion in addition to Flonase  Nyra Capeson W. Moore MD

## 2018-09-14 NOTE — Patient Instructions (Addendum)
Medicare Annual Wellness Visit  [place] and the medical providers at [facility] strive to bring you the best medical care.  In doing so we not only want to address your current medical conditions and concerns but also to detect new conditions early and prevent illness, disease and health-related problems.    Medicare offers a yearly Wellness Visit which allows our clinical staff to assess your need for preventative services including immunizations, lifestyle education, counseling to decrease risk of preventable diseases and screening for fall risk and other medical concerns.    This visit is provided free of charge (no copay) for all Medicare recipients. The clinical pharmacists at [facility] have begun to conduct these Wellness Visits which will also include a thorough review of all your medications.    As you primary medical provider recommend that you make an appointment for your Annual Wellness Visit if you have not done so already this year.  You may set up this appointment before you leave today or you may call back ([phone]) and schedule an appointment.  Please make sure when you call that you mention that you are scheduling your Annual Wellness Visit with the clinical pharmacist so that the appointment may be made for the proper length of time.     Continue current medications. Continue good therapeutic lifestyle changes which include good diet and exercise. Fall precautions discussed with patient. If an FOBT was given today- please return it to our front desk. If you are over 69 years old - you may need Prevnar 13 or the adult Pneumonia vaccine.  **Flu shots are available--- please call and schedule a FLU-CLINIC appointment**  After your visit with [place] today you will receive a survey in the mail or online from [employer] regarding your care with [place]. Please take a moment to fill this out. Your feedback is very  important to [place] as you can help [place] better understand your patient needs as well as improve your experience and satisfaction. WE CARE ABOUT YOU!!!   Continue to follow-up with weight watchers and make all efforts to lose weight through diet and exercise Try to be more careful with movements in the house look where you are walking so as not to stumble and fall Do not be in a hurry Do not forget to get your eye exam and get your mammogram as planned We will call with results of chest x-ray and the FOBT as soon as it is returned It also looks like that you may need to get a colonoscopy this spring. Get support hose for helping lower extremity edema and varicosities Use nasal saline for nasal congestion in addition to Flonase

## 2018-09-15 ENCOUNTER — Encounter: Payer: Self-pay | Admitting: *Deleted

## 2018-09-15 ENCOUNTER — Ambulatory Visit (INDEPENDENT_AMBULATORY_CARE_PROVIDER_SITE_OTHER): Payer: PPO | Admitting: *Deleted

## 2018-09-15 VITALS — BP 124/74 | HR 57 | Ht 65.0 in | Wt 255.0 lb

## 2018-09-15 DIAGNOSIS — Z0000 Encounter for general adult medical examination without abnormal findings: Secondary | ICD-10-CM | POA: Diagnosis not present

## 2018-09-15 NOTE — Progress Notes (Addendum)
Subjective:   Rhonda Daniel is a 68 y.o. female who presents for Medicare Annual (Subsequent) preventive examination.  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  She enjoys being involved in several groups including a teacher's sorority, the [employer] board, and [employer] wat[employer].   She is also active in her church.  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  She lives in the home she grew up in with her cat.  She is close with her family and neighbors, and sees them often.  She has a brother who lives locally, and a sister who passed away due to cervical cancer.  Rhonda Daniel feels her health has improved in the past year because she has restarted weight watchers which has helped her lose weight and make healthier food choices.  She reports no ER visits, hospitalizations, or surgeries in the past year.   Review of Systems:     Cardiac Risk Factors include: obesity (BMI >30kg/m2);hypertension     Objective:     Vitals: BP 124/74   Pulse (!) 57   Ht 5\' 5"  (1.651 m)   Wt 255 lb (115.7 kg)   BMI 42.43 kg/m   Body mass index is 42.43 kg/m.  Advanced Directives 09/15/2018 12/29/2016  Does Patient Have a Medical Advance Directive? Yes No  Type of Advance Directive Living will -  Does patient want to make changes to medical advance directive? No - Patient declined -  Would patient like information on creating a medical advance directive? - Yes (MAU/Ambulatory/Procedural Areas - Information given)    Tobacco Social History   Tobacco Use  Smoking Status Never Smoker  Smokeless Tobacco Never Used     Counseling given: No   Clinical Intake:     Pain Score: 0-No pain                 Past Medical History:  Diagnosis Date  . Anxiety   . Hyperlipidemia   . Hypertension   . Sleep apnea    Was on CPAP.   Does not use now.    Past Surgical History:  Procedure Laterality Date  . ABDOMINAL HYSTERECTOMY    .  APPENDECTOMY     with malignancy of appendix  . CHOLECYSTECTOMY    . COLON SURGERY     preventative - with appendix malignancy  . HERNIA REPAIR     Family History  Problem Relation Age of Onset  . Diabetes Mother   . Hypertension Mother   . Irregular heart beat Mother        pacemaker  . Hearing loss Mother        right  . Vision loss Mother        macular degeneration  . Rheumatic fever Father        heart murmur  . Pneumonia Father   . Heart disease Paternal Aunt   . Cancer Paternal Aunt        tumor behind eye  . Diabetes Maternal Grandmother   . Vision loss Maternal Grandmother   . Heart disease Paternal Grandfather   . Cancer - Cervical Sister    Social History   Socioeconomic History  . Marital status: Single    Spouse name: Not on file  . Number of children: Not on file  . Years of education: Not on file  . Highest education level: Master's degree (e.g., MA, MS, MEng, MEd,  MSW, MBA)  Occupational History  . Occupation: Retired     Comment: Advice workerchool librarian   Social Needs  . Financial resource strain: Not hard at all  . Food insecurity:    Worry: Never true    Inability: Never true  . Transportation needs:    Medical: No    Non-medical: No  Tobacco Use  . Smoking status: Never Smoker  . Smokeless tobacco: Never Used  Substance and Sexual Activity  . Alcohol use: No  . Drug use: No  . Sexual activity: Never  Lifestyle  . Physical activity:    Days per week: 0 days    Minutes per session: 0 min  . Stress: Not at all  Relationships  . Social connections:    Talks on phone: More than three times a week    Gets together: More than three times a week    Attends religious service: More than 4 times per year    Active member of club or organization: Yes    Attends meetings of clubs or organizations: More than 4 times per year    Relationship status: Never married  Other Topics Concern  . Not on file  Social History Narrative   Lives with patches  (cat) with diabetes.     Outpatient Encounter Medications as of 09/15/2018  Medication Sig  . aspirin 81 MG tablet Take 81 mg by mouth daily.  . fluticasone (FLONASE) 50 MCG/ACT nasal spray Place 2 sprays into both nostrils daily.  Marland Kitchen. lisinopril (PRINIVIL,ZESTRIL) 20 MG tablet Take 1 tablet (20 mg total) by mouth daily.  . simvastatin (ZOCOR) 20 MG tablet TAKE 1 TABLET (20 MG TOTAL) BY MOUTH AT BEDTIME.  Marland Kitchen. Vitamin D, Ergocalciferol, (DRISDOL) 1.25 MG (50000 UT) CAPS capsule TAKE ONE CAPSULE EVERY SEVEN DAYS  . albuterol (PROVENTIL HFA;VENTOLIN HFA) 108 (90 Base) MCG/ACT inhaler Inhale 2 puffs into the lungs every 6 (six) hours as needed for wheezing or shortness of breath. (Patient not taking: Reported on 09/14/2018)   No facility-administered encounter medications on file as of 09/15/2018.     Activities of Daily Living In your present state of health, do you have any difficulty performing the following activities: 09/15/2018  Hearing? N  Vision? N  Difficulty concentrating or making decisions? N  Walking or climbing stairs? Y  Comment Trouble climbing stairs, uses railings  Dressing or bathing? N  Doing errands, shopping? N  Preparing Food and eating ? N  Using the Toilet? N  In the past six months, have you accidently leaked urine? N  Do you have problems with loss of bowel control? N  Managing your Medications? N  Managing your Finances? N  Housekeeping or managing your Housekeeping? N  Some recent data might be hidden    Patient Care Team: Ernestina PennaMoore, Donald W, MD as PCP - General (Family Medicine) Derryl Harborran, Truc Ly, OD (Optometry) Rollene RotundaHochrein, James, MD as Consulting Physician (Cardiology) Ovidio KinNewman, David, MD as Consulting Physician (General Surgery)    Assessment:   This is a routine wellness examination for Rhonda Daniel.  Exercise Activities and Dietary recommendations  Ms. Lopata states she eats 3 meals per day and snacks as needed.  She is following the Weight Watchers plan and making  healthier food choices.  She describes a diet of mostly vegetables, fruits, whole grains, and lean proteins.  Healthy cooking methods were discussed.   Current Exercise Habits: The patient does not participate in regular exercise at present  Goals    . Exercise 3x  per week (30 min per time)     Become more active - work on  Brewing technologist.  [employer] is a great option.     . Weight (lb) < 175 lb (79.4 kg)       Fall Risk Fall Risk  09/15/2018 09/14/2018 03/04/2018 12/28/2017 10/01/2017  Falls in the past year? 1 1 No No No  Number falls in past yr: 0 0 - - -  Injury with Fall? 0 0 - - -  Follow up Education provided;Falls prevention discussed - - - -   Is the patient's home free of loose throw rugs in walkways, pet beds, electrical cords, etc?   yes      Grab bars in the bathroom? yes      Handrails on the stairs?   no stairs in home      Adequate lighting?   yes    Depression Screen PHQ 2/9 Scores 09/15/2018 09/14/2018 03/04/2018 12/28/2017  PHQ - 2 Score 0 0 0 0     Cognitive Function MMSE - Mini Mental State Exam 09/15/2018 12/29/2016  Orientation to time 5 5  Orientation to Place 5 5  Registration 3 3  Attention/ Calculation 5 5  Recall 3 3  Language- name 2 objects 2 2  Language- repeat 1 1  Language- follow 3 step command 3 3  Language- read & follow direction 1 1  Write a sentence 1 1  Copy design 1 1  Total score 30 30        Immunization History  Administered Date(s) Administered  . Tdap 05/02/2009    Qualifies for Shingles Vaccine? Yes, declined  Screening Tests Health Maintenance  Topic Date Due  . Hepatitis C Screening  03/05/2019 (Originally September 09, 1950)  . INFLUENZA VACCINE  05/07/2019 (Originally 04/07/2018)  . PNA vac Low Risk Adult (1 of 2 - PCV13) 09/16/2019 (Originally 06/18/2015)  . COLONOSCOPY  11/21/2018  . TETANUS/TDAP  05/03/2019  . MAMMOGRAM  10/02/2019  . DEXA SCAN  Completed    Patient declined influenza and Prevnar 13  vaccines.  Cancer Screenings: Lung: Low Dose CT Chest recommended if Age 56-80 years, 30 pack-year currently smoking OR have quit w/in 15years. Patient does not qualify. Breast:  Up to date on Mammogram? Yes, next mammogram scheduled 10/05/2018. Up to date of Bone Density/Dexa? Yes Colorectal: up to date- patient is due for colorectal screening in March 2020.  Discussed Cologuard as an option - patient states she will let our office know whether she chooses to have a colonoscopy or complete the Cologuard test.   Additional Screenings:  Hepatitis C Screening:  Recommend at next visit with Dr. Christell Constant.     Plan:     Work on your goal of increasing exercise to improve your upper body strength.  Exercising with the [employer] at the [facility] or [employer] is a great option.  Review the information given on Healthcare Power of [id number].  If you complete the paperwork, please bring a copy to our office to be filed in your medical record.  Bring a copy of your Living Will to our office to be filed in your medical record.  Follow up with Dr. Christell Constant as scheduled, and complete all of your upcoming preventative tests.    I have personally reviewed and noted the following in the patient's chart:   . Medical and social history . Use of alcohol, tobacco or illicit drugs  . Current medications and supplements . Functional  ability and status . Nutritional status . Physical activity . Advanced directives . List of other physicians . Hospitalizations, surgeries, and ER visits in previous 12 months . Vitals . Screenings to include cognitive, depression, and falls . Referrals and appointments  In addition, I have reviewed and discussed with patient certain preventive protocols, quality metrics, and best practice recommendations. A written personalized care plan for preventive services as well as general preventive health recommendations were provided to patient.     ,  M,  RN  09/15/2018  I have reviewed and agree with the above AWV documentation.   Nyra Capes MD

## 2018-09-15 NOTE — Patient Instructions (Addendum)
Please work on your goal of increasing exercise to improve your upper body strength.  Exercising with the [employer] at the [facility] or [employer] is a great option.   Please review the information given on [place].  If you complete the paperwork, please bring a copy to our office to be filed in your medical record.  Please bring a copy of your Living Will to our office to be filed in your medical record.   Please follow up with Dr. Laurance Flatten as scheduled, and complete all of your upcoming preventative tests.   Thank you for coming in for your Annual Wellness Visit today!  Preventive Care 1 Years and Older, Female Preventive care refers to lifestyle choices and visits with your health care provider that can promote health and wellness. What does preventive care include?  A yearly physical exam. This is also called an annual well check.  Dental exams once or twice a year.  Routine eye exams. Ask your health care provider how often you should have your eyes checked.  Personal lifestyle choices, including: ? Daily care of your teeth and gums. ? Regular physical activity. ? Eating a healthy diet. ? Avoiding tobacco and drug use. ? Limiting alcohol use. ? Practicing safe sex. ? Taking low-dose aspirin every day. ? Taking vitamin and mineral supplements as recommended by your health care provider. What happens during an annual well check? The services and screenings done by your health care provider during your annual well check will depend on your age, overall health, lifestyle risk factors, and family history of disease. Counseling Your health care provider may ask you questions about your:  Alcohol use.  Tobacco use.  Drug use.  Emotional well-being.  Home and relationship well-being.  Sexual activity.  Eating habits.  History of falls.  Memory and ability to understand (cognition).  Work and work Statistician.  Reproductive  health.  Screening You may have the following tests or measurements:  Height, weight, and BMI.  Blood pressure.  Lipid and cholesterol levels. These may be checked every 5 years, or more frequently if you are over 23 years old.  Skin check.  Lung cancer screening. You may have this screening every year starting at age 27 if you have a 30-pack-year history of smoking and currently smoke or have quit within the past 15 years.  Colorectal cancer screening. All adults should have this screening starting at age 24 and continuing until age 21. You will have tests every 1-10 years, depending on your results and the type of screening test. People at increased risk should start screening at an earlier age. Screening tests may include: ? Guaiac-based fecal occult blood testing. ? Fecal immunochemical test (FIT). ? Stool DNA test. ? Virtual colonoscopy. ? Sigmoidoscopy. During this test, a flexible tube with a tiny camera (sigmoidoscope) is used to examine your rectum and lower colon. The sigmoidoscope is inserted through your anus into your rectum and lower colon. ? Colonoscopy. During this test, a long, thin, flexible tube with a tiny camera (colonoscope) is used to examine your entire colon and rectum.  Hepatitis C blood test.  Hepatitis B blood test.  Sexually transmitted disease (STD) testing.  Diabetes screening. This is done by checking your blood sugar (glucose) after you have not eaten for a while (fasting). You may have this done every 1-3 years.  Bone density scan. This is done to screen for osteoporosis. You may have this done starting at age 46.  Mammogram. This may be done every 1-2 years. Talk to your health care provider about how often you should have regular mammograms. Talk with your health care provider about your test results, treatment options, and if necessary, the need for more tests. Vaccines Your health care provider may recommend certain vaccines, such  as:  Influenza vaccine. This is recommended every year.  Tetanus, diphtheria, and acellular pertussis (Tdap, Td) vaccine. You may need a Td booster every 10 years.  Varicella vaccine. You may need this if you have not been vaccinated.  Zoster vaccine. You may need this after age 93.  Measles, mumps, and rubella (MMR) vaccine. You may need at least one dose of MMR if you were born in 1957 or later. You may also need a second dose.  Pneumococcal 13-valent conjugate (PCV13) vaccine. One dose is recommended after age 36.  Pneumococcal polysaccharide (PPSV23) vaccine. One dose is recommended after age 32.  Meningococcal vaccine. You may need this if you have certain conditions.  Hepatitis A vaccine. You may need this if you have certain conditions or if you travel or work in places where you may be exposed to hepatitis A.  Hepatitis B vaccine. You may need this if you have certain conditions or if you travel or work in places where you may be exposed to hepatitis B.  Haemophilus influenzae type b (Hib) vaccine. You may need this if you have certain conditions. Talk to your health care provider about which screenings and vaccines you need and how often you need them. This information is not intended to replace advice given to you by your health care provider. Make sure you discuss any questions you have with your health care provider. Document Released: 09/20/2015 Document Revised: 10/14/2017 Document Reviewed: 06/25/2015 Elsevier Interactive Patient Education  2019 [place] Prevention in the Home, Adult Falls can cause injuries. They can happen to people of all ages. There are many things you can do to make your home safe and to help prevent falls. Ask for help when making these changes, if needed. What actions can I take to prevent falls? General Instructions  Use good lighting in all rooms. Replace any light bulbs that burn out.  Turn on the lights when you go into a  dark area. Use night-lights.  Keep items that you use often in easy-to-reach places. Lower the shelves around your home if necessary.  Set up your furniture so you have a clear path. Avoid moving your furniture around.  Do not have throw rugs and other things on the floor that can make you trip.  Avoid walking on wet floors.  If any of your floors are uneven, fix them.  Add color or contrast paint or tape to clearly mark and help you see: ? Any grab bars or handrails. ? First and last steps of stairways. ? Where the edge of each step is.  If you use a stepladder: ? Make sure that it is fully opened. Do not climb a closed stepladder. ? Make sure that both sides of the stepladder are locked into place. ? Ask someone to hold the stepladder for you while you use it.  If there are any pets around you, be aware of where they are. What can I do in the bathroom?      Keep the floor dry. Clean up any water that spills onto the floor as soon as it happens.  Remove soap buildup in the tub or shower regularly.  Use non-skid mats or decals on the floor of the tub or shower.  Attach bath mats securely with double-sided, non-slip rug tape.  If you need to sit down in the shower, use a plastic, non-slip stool.  Install grab bars by the toilet and in the tub and shower. Do not use towel bars as grab bars. What can I do in the bedroom?  Make sure that you have a light by your bed that is easy to reach.  Do not use any sheets or blankets that are too big for your bed. They should not hang down onto the floor.  Have a firm chair that has side arms. You can use this for support while you get dressed. What can I do in the kitchen?  Clean up any spills right away.  If you need to reach something above you, use a strong step stool that has a grab bar.  Keep electrical cords out of the way.  Do not use floor polish or wax that makes floors slippery. If you must use wax, use non-skid  floor wax. What can I do with my stairs?  Do not leave any items on the stairs.  Make sure that you have a light switch at the top of the stairs and the bottom of the stairs. If you do not have them, ask someone to add them for you.  Make sure that there are handrails on both sides of the stairs, and use them. Fix handrails that are broken or loose. Make sure that handrails are as long as the stairways.  Install non-slip stair treads on all stairs in your home.  Avoid having throw rugs at the top or bottom of the stairs. If you do have throw rugs, attach them to the floor with carpet tape.  Choose a carpet that does not hide the edge of the steps on the stairway.  Check any carpeting to make sure that it is firmly attached to the stairs. Fix any carpet that is loose or worn. What can I do on the outside of my home?  Use bright outdoor lighting.  Regularly fix the edges of walkways and driveways and fix any cracks.  Remove anything that might make you trip as you walk through a door, such as a raised step or threshold.  Trim any bushes or trees on the path to your home.  Regularly check to see if handrails are loose or broken. Make sure that both sides of any steps have handrails.  Install guardrails along the edges of any raised decks and porches.  Clear walking paths of anything that might make someone trip, such as tools or rocks.  Have any leaves, snow, or ice cleared regularly.  Use sand or salt on walking paths during winter.  Clean up any spills in your garage right away. This includes grease or oil spills. What other actions can I take?  Wear shoes that: ? Have a low heel. Do not wear high heels. ? Have rubber bottoms. ? Are comfortable and fit you well. ? Are closed at the toe. Do not wear open-toe sandals.  Use tools that help you move around (mobility aids) if they are needed. These include: ? Canes. ? Walkers. ? Scooters. ? Crutches.  Review your  medicines with your doctor. Some medicines can make you feel dizzy. This can increase your chance of falling. Ask your doctor what other things you can do to help prevent falls. Where to find more information  Centers  for Disease Control and Prevention, [facility]: https://garcia.biz/  [employer] on Aging: BrainJudge.co.uk Contact a doctor if:  You are afraid of falling at home.  You feel weak, drowsy, or dizzy at home.  You fall at home. Summary  There are many simple things that you can do to make your home safe and to help prevent falls.  Ways to make your home safe include removing tripping hazards and installing grab bars in the bathroom.  Ask for help when making these changes in your home. This information is not intended to replace advice given to you by your health care provider. Make sure you discuss any questions you have with your health care provider. Document Released: 06/20/2009 Document Revised: 04/08/2017 Document Reviewed: 04/08/2017 Elsevier Interactive Patient Education  2019 [employer].  8 Easy Exercises You Can Do Sitting Down  Got a chair? Then you're ready for this sit-down, total-body workout!.  Safety precaution: Pay attention to your body during the movements - if anything hurts or causes pain, stop immediately. And check with your doctor first before beginning this, or any, exercise program.   Sunshine Arm Circles Seated in a chair with good posture, hold a ball in both hands with arms extended above your head and/or in front of you, keeping elbows slightly bent. Visualizing the face of a clock out in front of you, begin by holding arms up overhead at 12 o'clock. Circle the ball around to go all the way around the clock in a controlled, fluid motion. When you've reached 12 o'clock again, reverse directions and circle the opposite way. Keep alternating circle directions for 8 repetitions. Rest. Do another set of  8 repetitions.  Modification: A ball is not required for this exercise. Imagine that you are holding a ball while performing the motion. If it is difficult to bring your arms overhead, extend them out in front of you and move arms as if drawing a circle on the wall with or without the ball.   Tummy Twists Seated in a chair with good posture, hold a ball with both hands close to the body, with elbows bent and pulled in close to the ribcage. Slowly rotate your torso to the right as far as you comfortably can, being sure to keep the rest of your body still and stable. Rotate back to the center and repeat in the opposite direction. Do this 8 times, with two twists counting as a full set. Rest. Do another 8 sets (two twists each). Modification: A ball is not required for this exercise. Imagine you are holding a ball while performing the motion, or hold a small object such as a can of soup or water bottle to add resistance   Ball Chest Press Seated in a chair with good posture, hold a ball with both hands at chest level, palms facing toward each other and elbows bent. Avoid bending forward by keeping your shoulders back at all times. Squeeze the ball slightly as you push the ball away from you in a fluid motion, taking about 2 seconds to extend the arms. Squeeze your shoulder blades together as you pull the ball back toward your chest. Repeat the push and pull motion 10 to 15 times. Rest. Do another set of 10 to 15 repetitions.  Modification: For a greater challenge, add a Tai Chi feel by standing with one leg slightly in front of the other (with a chair nearby if needed for extra balance) and slowly rocking the entire body forward and back  as you push the ball away and pull back in.     Front Arm Raises In a seated position with good posture, hold a ball in both hands with palms facing each other. Extend the arms out in front of your body, keeping your elbows slightly bent. Starting  with the ball lowered toward the knees, slowly raise your arms to lift the ball up to shoulder level (no higher), then lower the ball back to the starting position, taking about 2 to 3 seconds to lift and lower. Repeat 10 to 15 times. Rest. Do another set of 10 to 15 repetitions. Modification: A ball is not required for this exercise. Imagine you are holding a ball as you perform the motion, or hold a small object, such as a can of soup or water bottle for added resistance.        Inner Thigh Squeezes Sitting toward the edge of a chair with good posture and knees bent, place a ball in between your knees; press the knees together to squeeze the ball, taking about 1 to 2 seconds to squeeze. You should feel the resistance in your inner thighs. Slowly release, keeping slight tension on the ball so that it does not fall. Repeat 8 to 10 times. Rest. Do another set of 8 to 10 repetitions. Modification: For a greater challenge, change the count of the squeezes by squeezing the ball and holding for 5 seconds, then releasing again. Or, do short, quick pulsing squeezes.     Knee Extensions Sitting toward the edge of a chair with good posture and bent knees, hold on to the sides of the chair with your hands. Extend the right knee out so that the toes come up toward the ceiling, being sure to keep the knee slightly bent without locking it through the entire movement. Lower the leg back to a bent position and repeat this movement 8 to 10 times, using about 2 seconds each to lift and lower the leg. Switch to the opposite leg and perform 8 to 10 repetitions. Rest briefly. Do another set of 8 to 10 repetitions for each leg. Modification: If you are more advanced, sitting in the same position as above, extend one leg out in front of you with toes pointed to the ceiling. Lift and lower the entire leg only as high as you comfortably can, keeping the knee slightly bent. The longer lever adds  difficulty to the exercise.   Elbow to Knee Seated toward the edge of a chair with good posture and knees bent, start with your right arm extended up overhead. Slowly lift the left knee up as you lower your right elbow down toward your left knee, taking about 2 seconds to lower down. Try not to bend over at the waist. Release and go back to the starting position. Repeat 8 to 10 times. Switch sides and do 8 to 10 repetitions, pulling one elbow to the opposite knee. Rest. Do another set of 8 to 10 repetitions on each side. Modification: Try this (with a chair nearby for balance) exercise in a standing position for an increased range of motion.     Overhead Arm Extensions Seated in a chair with good posture, hold a ball with both hands and raise it up over your head, with arms extended without locking the elbows. Keeping the elbows pulled in toward the head, slowly bend the elbows to lower the ball down along the back of the neck, using about 2 seconds to go down,  then 2 seconds to push the ball back up over your head. Repeat 8 to 10 times. Rest. Do another set of 8 to 10 repetitions. Modification: Try seated tricep extensions (ball not required for this modification). Bending slightly forward with elbows tucked into your sides, slowly extend the elbows so that your forearms go back behind you, keeping the elbows pulled up and in for the entire movement. Return to the starting position and repeat. Hold soup cans or small weights for added resistance.

## 2018-10-04 ENCOUNTER — Other Ambulatory Visit: Payer: Self-pay | Admitting: Family Medicine

## 2018-10-04 ENCOUNTER — Other Ambulatory Visit: Payer: Self-pay | Admitting: Family

## 2018-10-05 DIAGNOSIS — Z1231 Encounter for screening mammogram for malignant neoplasm of breast: Secondary | ICD-10-CM | POA: Diagnosis not present

## 2018-10-05 LAB — HM MAMMOGRAPHY

## 2018-11-11 ENCOUNTER — Other Ambulatory Visit: Payer: Self-pay | Admitting: Family Medicine

## 2019-01-11 ENCOUNTER — Other Ambulatory Visit: Payer: Self-pay

## 2019-01-11 ENCOUNTER — Encounter: Payer: Self-pay | Admitting: Family Medicine

## 2019-01-11 ENCOUNTER — Ambulatory Visit (INDEPENDENT_AMBULATORY_CARE_PROVIDER_SITE_OTHER): Payer: PPO | Admitting: Family Medicine

## 2019-01-11 DIAGNOSIS — E7849 Other hyperlipidemia: Secondary | ICD-10-CM | POA: Diagnosis not present

## 2019-01-11 DIAGNOSIS — M5136 Other intervertebral disc degeneration, lumbar region: Secondary | ICD-10-CM | POA: Diagnosis not present

## 2019-01-11 DIAGNOSIS — G4730 Sleep apnea, unspecified: Secondary | ICD-10-CM | POA: Diagnosis not present

## 2019-01-11 DIAGNOSIS — I10 Essential (primary) hypertension: Secondary | ICD-10-CM

## 2019-01-11 DIAGNOSIS — E559 Vitamin D deficiency, unspecified: Secondary | ICD-10-CM | POA: Diagnosis not present

## 2019-01-11 DIAGNOSIS — M51369 Other intervertebral disc degeneration, lumbar region without mention of lumbar back pain or lower extremity pain: Secondary | ICD-10-CM

## 2019-01-11 DIAGNOSIS — J301 Allergic rhinitis due to pollen: Secondary | ICD-10-CM

## 2019-01-11 MED ORDER — ALBUTEROL SULFATE HFA 108 (90 BASE) MCG/ACT IN AERS: 2.0000 | INHALATION_SPRAY | Freq: Four times a day (QID) | RESPIRATORY_TRACT | 2 refills | Status: DC | PRN

## 2019-01-11 MED ORDER — FLUTICASONE PROPIONATE 50 MCG/ACT NA SUSP: 2.0000 | Freq: Every day | NASAL | 6 refills | Status: DC

## 2019-01-11 MED ORDER — SIMVASTATIN 20 MG PO TABS: 20.0000 mg | ORAL_TABLET | Freq: Every day | ORAL | 3 refills | Status: DC

## 2019-01-11 MED ORDER — LISINOPRIL 20 MG PO TABS: 20.0000 mg | ORAL_TABLET | Freq: Every day | ORAL | 3 refills | Status: DC

## 2019-01-11 MED ORDER — VITAMIN D (ERGOCALCIFEROL) 1.25 MG (50000 UNIT) PO CAPS: 50000.0000 [IU] | ORAL_CAPSULE | ORAL | 3 refills | Status: DC

## 2019-01-11 NOTE — Patient Instructions (Addendum)
Continue current treatment Work aggressively with diet and exercise to achieve weight loss Drink plenty of water and fluids When possible and coronavirus pandemic is under better control return to weight watchers Continue to practice good hand and respiratory hygiene Claritin over-the-counter for nasal congestion and allergy related eye symptoms Blood pressure monitor to check blood pressure readings at home

## 2019-01-11 NOTE — Addendum Note (Signed)
Addended by: Magdalene River on: 01/11/2019 01:15 PM   Modules accepted: Orders

## 2019-01-11 NOTE — Progress Notes (Signed)
Virtual Visit Via telephone Note I connected with the patient on 01/11/19 by telephone and verified that I am speaking with the correct person or authorized healthcare agent using two identifiers. Rhonda Daniel is currently located at home and there are no unauthorized people in close proximity. I completed this visit while in a private location in my home office.  This visit type was conducted due to national recommendations for restrictions regarding the COVID-19 Pandemic (e.g. social distancing).  This format is felt to be most appropriate for this patient at this time.  All issues noted in this document were discussed and addressed.  No physical exam was performed.    I discussed the limitations, risks, security and privacy concerns of performing an evaluation and management service by telephone and the availability of in person appointments. I also discussed with the patient that there may be a patient responsible charge related to this service. The patient expressed understanding and agreed to proceed.   Date:  01/11/2019    ID:  Quita Skye Dehn      1951-04-24        [id number]   Patient Care Team Patient Care Team: Ernestina Penna, MD as PCP - General (Family Medicine) Derryl Harbor, OD (Optometry) Rollene Rotunda, MD as Consulting Physician (Cardiology) Ovidio Kin, MD as Consulting Physician (General Surgery)  Reason for Visit: Primary Care Follow-up     History of Present Illness & Review of Systems:     Rhonda Daniel is a 68 y.o. year old female primary care patient that presents today for a telehealth visit.  The patient is alert and positive and continuing to be protective from being exposed to people and all of the issues regarding COVID-19.  She denies any chest pain pressure tightness or shortness of breath other than shortness of breath associated with her allergic rhinitis.  She denies any trouble with her stomach including heartburn indigestion nausea vomiting  diarrhea blood in the stool or black tarry bowel movements.  She is passing her water well.  She uses Flonase for her allergies.  It does not seem like it is helping enough.  She has an eye exam scheduled this July.  She is not able to check blood pressures at home.  She still has sleep apnea but is bothered less with this and is not using a CPAP machine.  Review of systems as stated otherwise negative for body systems left unmentioned.   The patient does not have symptoms concerning for COVID-19 infection (fever, chills, cough, or new shortness of breath).      Current Medications (Verified) Allergies as of 01/11/2019      Reactions   Bee Venom    Local redness and swelling       Medication List       Accurate as of Jan 11, 2019  9:33 AM. Always use your most recent med list.        albuterol 108 (90 Base) MCG/ACT inhaler Commonly known as:  VENTOLIN HFA Inhale 2 puffs into the lungs every 6 (six) hours as needed for wheezing or shortness of breath.   aspirin 81 MG tablet Take 81 mg by mouth daily.   fluticasone 50 MCG/ACT nasal spray Commonly known as:  FLONASE Place 2 sprays into both nostrils daily.   lisinopril 20 MG tablet Commonly known as:  ZESTRIL TAKE 1 TABLET DAILY   simvastatin 20 MG tablet Commonly known as:  ZOCOR TAKE 1 TABLET (  20 MG TOTAL) BY MOUTH AT BEDTIME.   Vitamin D (Ergocalciferol) 1.25 MG (50000 UT) Caps capsule Commonly known as:  DRISDOL TAKE ONE CAPSULE EVERY SEVEN DAYS           Allergies (Verified)    Bee venom  Past Medical History Past Medical History:  Diagnosis Date   Anxiety    Hyperlipidemia    Hypertension    Sleep apnea    Was on CPAP.   Does not use now.      Past Surgical History:  Procedure Laterality Date   ABDOMINAL HYSTERECTOMY     APPENDECTOMY     with malignancy of appendix   CHOLECYSTECTOMY     COLON SURGERY     preventative - with appendix malignancy   HERNIA REPAIR      Social History    Socioeconomic History   Marital status: Single    Spouse name: Not on file   Number of children: Not on file   Years of education: Not on file   Highest education level: Master's degree (e.g., MA, MS, MEng, MEd, MSW, MBA)  Occupational History   Occupation: Retired     Comment: Programmer, systemschool librarian   Social Needs   Financial resource strain: Not hard at all   Food insecurity:    Worry: Never true    Inability: Never true   Transportation needs:    Medical: No    Non-medical: No  Tobacco Use   Smoking status: Never Smoker   Smokeless tobacco: Never Used  Substance and Sexual Activity   Alcohol use: No   Drug use: No   Sexual activity: Never  Lifestyle   Physical activity:    Days per week: 0 days    Minutes per session: 0 min   Stress: Not at all  Relationships   Social connections:    Talks on phone: More than three times a week    Gets together: More than three times a week    Attends religious service: More than 4 times per year    Active member of club or organization: Yes    Attends meetings of clubs or organizations: More than 4 times per year    Relationship status: Never married  Other Topics Concern   Not on file  Social History Narrative   Lives with patches (cat) with diabetes.      Family History  Problem Relation Age of Onset   Diabetes Mother    Hypertension Mother    Irregular heart beat Mother        pacemaker   Hearing loss Mother        right   Vision loss Mother        macular degeneration   Rheumatic fever Father        heart murmur   Pneumonia Father    Heart disease Paternal Aunt    Cancer Paternal Aunt        tumor behind eye   Diabetes Maternal Grandmother    Vision loss Maternal Grandmother    Heart disease Paternal Grandfather    Cancer - Cervical Sister       Labs/Other Tests and Data Reviewed:    Wt Readings from Last 3 Encounters:  09/15/18 255 lb (115.7 kg)  09/14/18 256 lb (116.1 kg)   04/18/18 255 lb 3.2 oz (115.8 kg)   Temp Readings from Last 3 Encounters:  09/14/18 (!) 97.2 F (36.2 C) (Oral)  03/04/18 (!) 96.9 F (36.1 C) (Oral)  12/28/17 (!) 97 F (36.1 C) (Oral)   BP Readings from Last 3 Encounters:  09/15/18 124/74  09/14/18 137/82  04/18/18 (!) 150/88   Pulse Readings from Last 3 Encounters:  09/15/18 (!) 57  09/14/18 (!) 56  04/18/18 66     No results found for: HGBA1C Lab Results  Component Value Date   LDLCALC 74 09/13/2018   CREATININE 0.76 09/13/2018       Chemistry      Component Value Date/Time   NA 143 09/13/2018 1115   K 4.3 09/13/2018 1115   CL 104 09/13/2018 1115   CO2 23 09/13/2018 1115   BUN 19 09/13/2018 1115   CREATININE 0.76 09/13/2018 1115      Component Value Date/Time   CALCIUM 9.4 09/13/2018 1115   ALKPHOS 67 09/13/2018 1115   AST 19 09/13/2018 1115   ALT 13 09/13/2018 1115   BILITOT 0.5 09/13/2018 1115         OBSERVATIONS/ OBJECTIVE:     Patient is alert and has a positive attitude.  She has been going to weight watchers but since that has not been possible recently she is trying to maintain her weight at home and that is running about 251 pounds.  She continues to watch her diet closely.  She is no longer able to check her blood pressures at home and will get a new monitor if her insurance will cover this.  The old monitor is not working.  She will continue to watch her sodium intake.  Her family members are bringing her food from the grocery store.  Her eye exam is due in July.  Physical exam deferred due to nature of telephonic visit.  ASSESSMENT & PLAN    Time:   Today, I have spent23 minutes with the patient via telephone discussing the above including Covid precautions.     Visit Diagnoses: 1. Vitamin D deficiency -Continue with vitamin D replacement 50,000 units once weekly depending on lab work that is to be done.  2. Morbid obesity (HCC) -Continue to follow diet closely and get as much  exercise with walking as possible  3. Essential hypertension -Get new blood pressure monitor so that blood pressures can be checked more frequently at home.  Watch sodium intake.  Lose weight through diet and exercise.  4. Other hyperlipidemia -Continue with atorvastatin pending results of lab work  5. Sleep apnea, unspecified type -No longer uses CPAP and is not having any trouble with sleep apnea currently.  6. Degenerative disc disease, lumbar -Walk regularly and take Tylenol if needed for pain  Will refill vitamin D simvastatin and lisinopril and patient will pick up some generic Claritin to help with Flonase and nasal congestion  Patient Instructions  Continue current treatment Work aggressively with diet and exercise to achieve weight loss Drink plenty of water and fluids When possible and coronavirus pandemic is under better control return to weight watchers Continue to practice good hand and respiratory hygiene Claritin over-the-counter for nasal congestion and allergy related eye symptoms Blood pressure monitor to check blood pressure readings at home      The above assessment and management plan was discussed with the patient. The patient verbalized understanding of and has agreed to the management plan. Patient is aware to call the clinic if symptoms persist or worsen. Patient is aware when to return to the clinic for a follow-up visit. Patient educated on when it is appropriate to go to the emergency department.    Dorinda Hill  [name] Register, MD [facility] [address] [place], [place], [place] [address] Ph [phone]   Nyra Capes MD

## 2019-04-28 ENCOUNTER — Other Ambulatory Visit: Payer: Self-pay

## 2019-04-28 ENCOUNTER — Encounter: Payer: Self-pay | Admitting: Family

## 2019-04-28 ENCOUNTER — Ambulatory Visit (INDEPENDENT_AMBULATORY_CARE_PROVIDER_SITE_OTHER): Payer: PPO | Admitting: Family

## 2019-04-28 DIAGNOSIS — L03115 Cellulitis of right lower limb: Secondary | ICD-10-CM | POA: Diagnosis not present

## 2019-04-28 MED ORDER — FUROSEMIDE 20 MG PO TABS: 20.0000 mg | ORAL_TABLET | Freq: Every day | ORAL | 3 refills | Status: DC

## 2019-04-28 MED ORDER — CEPHALEXIN 500 MG PO CAPS: 500.0000 mg | ORAL_CAPSULE | Freq: Three times a day (TID) | ORAL | 0 refills | Status: DC

## 2019-04-28 NOTE — Progress Notes (Signed)
   Virtual Visit via telephone Note Due to COVID-19 pandemic this visit was conducted virtually. This visit type was conducted due to national recommendations for restrictions regarding the COVID-19 Pandemic (e.g. social distancing, sheltering in place) in an effort to limit this patient's exposure and mitigate transmission in our community. All issues noted in this document were discussed and addressed.  A physical exam was not performed with this format.  I connected with Rhonda Daniel on 04/28/19 at 4:08 pm by telephone and verified that I am speaking with the correct person using two identifiers. Rhonda Daniel is currently located at home and no one is currently with her during visit. The provider, Evelina Dun, FNP is located in their office at time of visit.  I discussed the limitations, risks, security and privacy concerns of performing an evaluation and management service by telephone and the availability of in person appointments. I also discussed with the patient that there may be a patient responsible charge related to this service. The patient expressed understanding and agreed to proceed.   History and Present Illness:  HPI PT calls about blister on her right lower leg. She states she had cellulites in this leg many times and has discoloration in this leg related to the celluilites. She states a few hours ago and is unchanged.   She states she has mild swelling which is normal. She denies any pain, fever, or warmth.    Review of Systems  Cardiovascular: Positive for leg swelling.  All other systems reviewed and are negative.    Observations/Objective: No SOB or distress noted   Assessment and Plan: 1. Cellulitis of right lower extremity Pt will start Lasix  Compresses hose daily Keep elevated Will send in Keflex if symptoms worsen over the weekend she will start Call office if symptoms worsen or do not improve  - furosemide (LASIX) 20 MG tablet; Take 1 tablet (20 mg  total) by mouth daily.  Dispense: 30 tablet; Refill: 3 - cephALEXin (KEFLEX) 500 MG capsule; Take 1 capsule (500 mg total) by mouth 3 (three) times daily.  Dispense: 30 capsule; Refill: 0     I discussed the assessment and treatment plan with the patient. The patient was provided an opportunity to ask questions and all were answered. The patient agreed with the plan and demonstrated an understanding of the instructions.   The patient was advised to call back or seek an in-person evaluation if the symptoms worsen or if the condition fails to improve as anticipated.  The above assessment and management plan was discussed with the patient. The patient verbalized understanding of and has agreed to the management plan. Patient is aware to call the clinic if symptoms persist or worsen. Patient is aware when to return to the clinic for a follow-up visit. Patient educated on when it is appropriate to go to the emergency department.   Time call ended:  4:27 pm  I provided 19 minutes of non-face-to-face time during this encounter.    Evelina Dun, FNP

## 2019-05-01 ENCOUNTER — Ambulatory Visit: Payer: PPO | Admitting: Family Medicine

## 2019-05-08 ENCOUNTER — Ambulatory Visit: Payer: PPO | Admitting: Family Medicine

## 2019-05-09 ENCOUNTER — Encounter: Payer: Self-pay | Admitting: Family Medicine

## 2019-05-09 ENCOUNTER — Other Ambulatory Visit: Payer: Self-pay

## 2019-05-09 ENCOUNTER — Ambulatory Visit (INDEPENDENT_AMBULATORY_CARE_PROVIDER_SITE_OTHER): Payer: PPO | Admitting: Family Medicine

## 2019-05-09 VITALS — BP 131/84 | HR 67 | Temp 98.4°F | Ht 65.0 in | Wt 267.0 lb

## 2019-05-09 DIAGNOSIS — L02415 Cutaneous abscess of right lower limb: Secondary | ICD-10-CM | POA: Diagnosis not present

## 2019-05-09 DIAGNOSIS — L03115 Cellulitis of right lower limb: Secondary | ICD-10-CM

## 2019-05-09 MED ORDER — DOXYCYCLINE HYCLATE 100 MG PO TABS: 100.0000 mg | ORAL_TABLET | Freq: Two times a day (BID) | ORAL | 0 refills | Status: DC

## 2019-05-09 NOTE — Patient Instructions (Signed)
Keep area clean with Dial soap and water.  You can apply a little bit of Vaseline over top of it to keep it protected and then apply a bandage on top of that. I have changed her antibiotic to doxycycline.  Take 1 tablet twice daily with breakfast and supper.  If your symptoms get worse or you start developing any worrisome symptoms or signs like fevers, increased pain or increased swelling, please seek reevaluation.   Cellulitis, Adult  Cellulitis is a skin infection. The infected area is often warm, red, swollen, and sore. It occurs most often in the arms and lower legs. It is very important to get treated for this condition. What are the causes? This condition is caused by bacteria. The bacteria enter through a break in the skin, such as a cut, burn, insect bite, open sore, or crack. What increases the risk? This condition is more likely to occur in people who:  Have a weak body defense system (immune system).  Have open cuts, burns, bites, or scrapes on the skin.  Are older than 69 years of age.  Have a blood sugar problem (diabetes).  Have a long-lasting (chronic) liver disease (cirrhosis) or kidney disease.  Are very overweight (obese).  Have a skin problem, such as: ? Itchy rash (eczema). ? Slow movement of blood in the veins (venous stasis). ? Fluid buildup below the skin (edema).  Have been treated with high-energy rays (radiation).  Use IV drugs. What are the signs or symptoms? Symptoms of this condition include:  Skin that is: ? Red. ? Streaking. ? Spotting. ? Swollen. ? Sore or painful when you touch it. ? Warm.  A fever.  Chills.  Blisters. How is this diagnosed? This condition is diagnosed based on:  Medical history.  Physical exam.  Blood tests.  Imaging tests. How is this treated? Treatment for this condition may include:  Medicines to treat infections or allergies.  Home care, such as: ? Rest. ? Placing cold or warm cloths (compresses)  on the skin.  Hospital care, if the condition is very bad. Follow these instructions at home: Medicines  Take over-the-counter and prescription medicines only as told by your doctor.  If you were prescribed an antibiotic medicine, take it as told by your doctor. Do not stop taking it even if you start to feel better. General instructions   Drink enough fluid to keep your pee (urine) pale yellow.  Do not touch or rub the infected area.  Raise (elevate) the infected area above the level of your heart while you are sitting or lying down.  Place cold or warm cloths on the area as told by your doctor.  Keep all follow-up visits as told by your doctor. This is important. Contact a doctor if:  You have a fever.  You do not start to get better after 1-2 days of treatment.  Your bone or joint under the infected area starts to hurt after the skin has healed.  Your infection comes back. This can happen in the same area or another area.  You have a swollen bump in the area.  You have new symptoms.  You feel ill and have muscle aches and pains. Get help right away if:  Your symptoms get worse.  You feel very sleepy.  You throw up (vomit) or have watery poop (diarrhea) for a long time.  You see red streaks coming from the area.  Your red area gets larger.  Your red area turns dark in  color. These symptoms may represent a serious problem that is an emergency. Do not wait to see if the symptoms will go away. Get medical help right away. Call your local emergency services (911 in the [place] Do not drive yourself to the hospital. Summary  Cellulitis is a skin infection. The area is often warm, red, swollen, and sore.  This condition is treated with medicines, rest, and cold and warm cloths.  Take all medicines only as told by your doctor.  Tell your doctor if symptoms do not start to get better after 1-2 days of treatment. This information is not intended to replace advice  given to you by your health care provider. Make sure you discuss any questions you have with your health care provider. Document Released: 02/10/2008 Document Revised: 01/13/2018 Document Reviewed: 01/13/2018 Elsevier Patient Education  2020 [employer]

## 2019-05-09 NOTE — Progress Notes (Signed)
Subjective: CC: Cellulitis follow-up PCP: Janora Norlander, DO Rhonda Daniel is a 68 y.o. female presenting to clinic today for:  1.  Cellulitis Patient was seen for cellulitis of the right lower extremity on 04/28/2019.  At that time she had no purulent drainage and was prescribed Keflex.  She notes that she has 1 more day of medication but the area on the leg seems to be getting redder and now she is having purulent discharge.  Denies any significant pain, fever.   ROS: Per HPI  Allergies  Allergen Reactions  . Bee Venom     Local redness and swelling    Past Medical History:  Diagnosis Date  . Anxiety   . Hyperlipidemia   . Hypertension   . Sleep apnea    Was on CPAP.   Does not use now.     Current Outpatient Medications:  .  albuterol (VENTOLIN HFA) 108 (90 Base) MCG/ACT inhaler, Inhale 2 puffs into the lungs every 6 (six) hours as needed for wheezing or shortness of breath., Disp: 1 Inhaler, Rfl: 2 .  aspirin 81 MG tablet, Take 81 mg by mouth daily., Disp: , Rfl:  .  cephALEXin (KEFLEX) 500 MG capsule, Take 1 capsule (500 mg total) by mouth 3 (three) times daily., Disp: 30 capsule, Rfl: 0 .  fluticasone (FLONASE) 50 MCG/ACT nasal spray, Place 2 sprays into both nostrils daily., Disp: 16 g, Rfl: 6 .  furosemide (LASIX) 20 MG tablet, Take 1 tablet (20 mg total) by mouth daily., Disp: 30 tablet, Rfl: 3 .  lisinopril (ZESTRIL) 20 MG tablet, Take 1 tablet (20 mg total) by mouth daily., Disp: 90 tablet, Rfl: 3 .  simvastatin (ZOCOR) 20 MG tablet, Take 1 tablet (20 mg total) by mouth daily at 6 PM., Disp: 90 tablet, Rfl: 3 .  Vitamin D, Ergocalciferol, (DRISDOL) 1.25 MG (50000 UT) CAPS capsule, Take 1 capsule (50,000 Units total) by mouth every 7 (seven) days., Disp: 12 capsule, Rfl: 3 Social History   Socioeconomic History  . Marital status: Single    Spouse name: Not on file  . Number of children: Not on file  . Years of education: Not on file  . Highest education  level: Master's degree (e.g., MA, MS, MEng, MEd, MSW, MBA)  Occupational History  . Occupation: Retired     Comment: Proofreader   Social Needs  . Financial resource strain: Not hard at all  . Food insecurity    Worry: Never true    Inability: Never true  . Transportation needs    Medical: No    Non-medical: No  Tobacco Use  . Smoking status: Never Smoker  . Smokeless tobacco: Never Used  Substance and Sexual Activity  . Alcohol use: No  . Drug use: No  . Sexual activity: Never  Lifestyle  . Physical activity    Days per week: 0 days    Minutes per session: 0 min  . Stress: Not at all  Relationships  . Social connections    Talks on phone: More than three times a week    Gets together: More than three times a week    Attends religious service: More than 4 times per year    Active member of club or organization: Yes    Attends meetings of clubs or organizations: More than 4 times per year    Relationship status: Never married  . Intimate partner violence    Fear of current or ex partner: No  Emotionally abused: No    Physically abused: No    Forced sexual activity: No  Other Topics Concern  . Not on file  Social History Narrative   Lives with patches (cat) with diabetes.    Family History  Problem Relation Age of Onset  . Diabetes Mother   . Hypertension Mother   . Irregular heart beat Mother        pacemaker  . Hearing loss Mother        right  . Vision loss Mother        macular degeneration  . Rheumatic fever Father        heart murmur  . Pneumonia Father   . Heart disease Paternal Aunt   . Cancer Paternal Aunt        tumor behind eye  . Diabetes Maternal Grandmother   . Vision loss Maternal Grandmother   . Heart disease Paternal Grandfather   . Cancer - Cervical Sister     Objective: Office vital signs reviewed. BP 131/84   Pulse 67   Temp 98.4 F (36.9 C) (Oral)   Ht 5\' 5"  (1.651 m)   Wt 267 lb (121.1 kg)   BMI 44.43 kg/m    Physical Examination:  General: Awake, alert, well nourished, No acute distress Skin: She has hyperpigmentation secondary to venous stasis changes along the inferior aspect of the lower extremity.  She has a nickel sized area of a fluid-filled bullae along the medial/inferior aspect of the leg.  She has mild surrounding erythema and mild increased warmth.  Procedure: Simple I&D was performed today.  Verbal consent was provided.   The affected area was cleaned with alcohol swab.  I&D achieved using a 22-gauge needle and ethyl chloride spray for local anesthesia.  Serous fluid drained from the lesion.  Patient tolerated procedure well.  There were no immediate complications.  Assessment/ Plan: 68 y.o. female   1. Cellulitis and abscess of right leg Lesion was drained and there were no immediate complications.  I have placed her on doxycycline for better staph coverage.  She will take 1 tablet twice daily with a meal.  We discussed wound care.  We discussed reasons for return and emergent evaluation.  She was good understanding will follow-up PRN - doxycycline (VIBRA-TABS) 100 MG tablet; Take 1 tablet (100 mg total) by mouth 2 (two) times daily.  Dispense: 20 tablet; Refill: 0   No orders of the defined types were placed in this encounter.  No orders of the defined types were placed in this encounter.    Raliegh IpAshly M Almira Phetteplace, DO [facility] [phone]

## 2019-06-15 ENCOUNTER — Other Ambulatory Visit: Payer: Self-pay

## 2019-06-16 ENCOUNTER — Encounter: Payer: Self-pay | Admitting: Family Medicine

## 2019-06-16 ENCOUNTER — Ambulatory Visit (INDEPENDENT_AMBULATORY_CARE_PROVIDER_SITE_OTHER): Payer: PPO | Admitting: Family Medicine

## 2019-06-16 VITALS — BP 147/79 | HR 81 | Temp 98.4°F | Ht 67.0 in | Wt 273.8 lb

## 2019-06-16 DIAGNOSIS — H0102B Squamous blepharitis left eye, upper and lower eyelids: Secondary | ICD-10-CM

## 2019-06-16 DIAGNOSIS — Z2821 Immunization not carried out because of patient refusal: Secondary | ICD-10-CM | POA: Diagnosis not present

## 2019-06-16 DIAGNOSIS — I10 Essential (primary) hypertension: Secondary | ICD-10-CM | POA: Diagnosis not present

## 2019-06-16 DIAGNOSIS — E7849 Other hyperlipidemia: Secondary | ICD-10-CM | POA: Diagnosis not present

## 2019-06-16 LAB — BAYER DCA HB A1C WAIVED: HB A1C (BAYER DCA - WAIVED): 5.1 % (ref <7.0)

## 2019-06-16 NOTE — Progress Notes (Signed)
 Subjective: CC: HTN, HLD PCP: Gottschalk, Ashly M, DO HPI:Rhonda Daniel is a 68 y.o. female presenting to clinic today for:  1.  Hypertension with hyperlipidemia. Patient reports compliance with lisinopril 20 mg daily and Zocor 20 mg daily.  No chest pain, shortness of breath, falls or dizziness.  2.  Allergic rhinitis Patient with longstanding history of allergic rhinitis well controlled with Flonase and Claritin.  She also uses [place] nasal spray daily.  She notes that she has some irritation of the left upper eyelid and has noticed some bumps and crustiness.  It only occurs on the left side.  No pain, redness.  She is wondering if this is related to her allergies and if there is something she should be doing about it.  ROS: Per HPI  Allergies  Allergen Reactions  . Bee Venom     Local redness and swelling    Past Medical History:  Diagnosis Date  . Anxiety   . Hyperlipidemia   . Hypertension   . Sleep apnea    Was on CPAP.   Does not use now.     Current Outpatient Medications:  .  albuterol (VENTOLIN HFA) 108 (90 Base) MCG/ACT inhaler, Inhale 2 puffs into the lungs every 6 (six) hours as needed for wheezing or shortness of breath., Disp: 1 Inhaler, Rfl: 2 .  aspirin 81 MG tablet, Take 81 mg by mouth daily., Disp: , Rfl:  .  cephALEXin (KEFLEX) 500 MG capsule, Take 1 capsule (500 mg total) by mouth 3 (three) times daily., Disp: 30 capsule, Rfl: 0 .  doxycycline (VIBRA-TABS) 100 MG tablet, Take 1 tablet (100 mg total) by mouth 2 (two) times daily., Disp: 20 tablet, Rfl: 0 .  fluticasone (FLONASE) 50 MCG/ACT nasal spray, Place 2 sprays into both nostrils daily., Disp: 16 g, Rfl: 6 .  furosemide (LASIX) 20 MG tablet, Take 1 tablet (20 mg total) by mouth daily., Disp: 30 tablet, Rfl: 3 .  lisinopril (ZESTRIL) 20 MG tablet, Take 1 tablet (20 mg total) by mouth daily., Disp: 90 tablet, Rfl: 3 .  simvastatin (ZOCOR) 20 MG tablet, Take 1 tablet (20 mg total) by mouth daily at 6 PM.,  Disp: 90 tablet, Rfl: 3 .  Vitamin D, Ergocalciferol, (DRISDOL) 1.25 MG (50000 UT) CAPS capsule, Take 1 capsule (50,000 Units total) by mouth every 7 (seven) days., Disp: 12 capsule, Rfl: 3 Social History   Socioeconomic History  . Marital status: Single    Spouse name: Not on file  . Number of children: Not on file  . Years of education: Not on file  . Highest education level: Master's degree (e.g., MA, MS, MEng, MEd, MSW, MBA)  Occupational History  . Occupation: Retired     Comment: School librarian   Social Needs  . Financial resource strain: Not hard at all  . Food insecurity    Worry: Never true    Inability: Never true  . Transportation needs    Medical: No    Non-medical: No  Tobacco Use  . Smoking status: Never Smoker  . Smokeless tobacco: Never Used  Substance and Sexual Activity  . Alcohol use: No  . Drug use: No  . Sexual activity: Never  Lifestyle  . Physical activity    Days per week: 0 days    Minutes per session: 0 min  . Stress: Not at all  Relationships  . Social connections    Talks on phone: More than three times a week      Gets together: More than three times a week    Attends religious service: More than 4 times per year    Active member of club or organization: Yes    Attends meetings of clubs or organizations: More than 4 times per year    Relationship status: Never married  . Intimate partner violence    Fear of current or ex partner: No    Emotionally abused: No    Physically abused: No    Forced sexual activity: No  Other Topics Concern  . Not on file  Social History Narrative   Lives with patches (cat) with diabetes.    Family History  Problem Relation Age of Onset  . Diabetes Mother   . Hypertension Mother   . Irregular heart beat Mother        pacemaker  . Hearing loss Mother        right  . Vision loss Mother        macular degeneration  . Rheumatic fever Father        heart murmur  . Pneumonia Father   . Heart disease  Paternal Aunt   . Cancer Paternal Aunt        tumor behind eye  . Diabetes Maternal Grandmother   . Vision loss Maternal Grandmother   . Heart disease Paternal Grandfather   . Cancer - Cervical Sister     Objective: Office vital signs reviewed. BP (!) 147/79   Pulse 81   Temp 98.4 F (36.9 C) (Temporal)   Ht 5' 7" (1.702 m)   Wt 273 lb 12.8 oz (124.2 kg)   SpO2 98%   BMI 42.88 kg/m   Physical Examination:  General: Awake, alert, well nourished, No acute distress HEENT: Normal    Neck: No masses palpated. No lymphadenopathy    Ears: Tympanic membranes intact, normal light reflex, no erythema, no bulging    Eyes: PERRLA, extraocular membranes intact, sclera white; left upper eyelid with crusting within the lashes.  The lower eyelid with a small bumps noted.  Nothing to suggest stye or infection at this time. Cardio: regular rate and rhythm, S1S2 heard, no murmurs appreciated Pulm: clear to auscultation bilaterally, no wheezes, rhonchi or rales; normal work of breathing on room air GI: soft, non-tender, non-distended, bowel sounds present x4, no hepatomegaly, no splenomegaly, no masses Extremities: warm, well perfused, No cyanosis or clubbing; she has chronic venous stasis discoloration of the legs bilaterally.  +2 pulses bilaterally  Assessment/ Plan: 68 y.o. female   1. Essential hypertension Controlled for age.  Continue current regimen - CMP14+EGFR  2. Other hyperlipidemia Check fasting lipid panel - CMP14+EGFR - Lipid Panel  3. Morbid obesity (HCC) Check A1c and thyroid - TSH - Bayer DCA Hb A1c Waived  4. Squamous blepharitis of both upper and lower eyelid of left eye Advised to keep lashes clean and start a lubricating eyedrop.  5. Refused influenza vaccine Counseled.  She will consider later   Orders Placed This Encounter  Procedures  . CMP14+EGFR  . Lipid Panel  . TSH  . Bayer DCA Hb A1c Waived   No orders of the defined types were placed in this  encounter.    Ashly M Gottschalk, DO [facility] [phone]   

## 2019-06-16 NOTE — Patient Instructions (Addendum)
You had labs performed today.  You will be contacted with the results of the labs once they are available, usually in the next 3 business days for routine lab work.  If you have an active my chart account, they will be released to your MyChart.  If you prefer to have these labs released to you via telephone, please let us know.  If you had a pap smear or biopsy performed, expect to be contacted in about 7-10 days.  Use Refresh eye drops every night.  Blepharitis Blepharitis is swelling of the eyelids. Symptoms may include:  Reddish, scaly skin around the scalp and eyebrows.  Burning or itching of the eyelids.  Fluid coming from the eye at night. This causes the eyelashes to stick together in the morning.  Eyelashes that fall out.  Being sensitive to light. Follow these instructions at home: Pay attention to any changes in how you look or feel. Tell your health care provider about any changes. Follow these instructions to help with your condition: Keeping clean   Wash your hands often.  Wash your eyelids with warm water, or wash them with warm water that is mixed with little bit of baby shampoo. Do this 2 or more times per day.  Wash your face and eyebrows at least once a day.  Use a clean towel each time you dry your eyelids. Do not use the towel to clean or dry other areas of your body. Do not share your towel with anyone. General instructions  Avoid wearing makeup until you get better. Do not share makeup with anyone.  Avoid rubbing your eyes.  Put a warm compress on your eyes 2 times per day for 10 minutes at a time, or as told by your doctor.  If you were given antibiotics in the form of creams or eye drops, use the medicine as told by your doctor. Do not stop using the medicine even if you feel better.  Keep all follow-up visits as told by your doctor. This is important. Contact a doctor if:  Your eyelids feel hot.  You have blisters on your eyelids.  You have a  rash on your eyelids.  The swelling does not go away in 2-4 days.  The swelling gets worse. Get help right away if:  You have pain that gets worse.  You have pain that spreads to other parts of your face.  You have redness that gets worse.  You have redness that spreads to other parts of your face.  Your vision changes.  You have pain when you look at lights or things that move.  You have a fever. Summary  Blepharitis is swelling of the eyelids.  Pay attention to any changes in how your eyes look or feel. Tell your doctor about any changes.  Follow home care instructions as told by your doctor. Wash your hands often. Avoid wearing makeup. Do not rub your eyes.  Use warm compresses, creams, or eye drops as told by your doctor.  Let your doctor know if you have changes in vision, blisters or rash on eyelids, pain that spreads to your face, or warmth on your eyelids. This information is not intended to replace advice given to you by your health care provider. Make sure you discuss any questions you have with your health care provider. Document Released: 06/02/2008 Document Revised: 02/21/2018 Document Reviewed: 02/21/2018 Elsevier Patient Education  2020 [employer].

## 2019-06-17 LAB — CMP14+EGFR
ALT: 15 IU/L (ref 0–32)
AST: 21 IU/L (ref 0–40)
Albumin/Globulin Ratio: 1.8 (ref 1.2–2.2)
Albumin: 4.2 g/dL (ref 3.8–4.8)
Alkaline Phosphatase: 73 IU/L (ref 39–117)
BUN/Creatinine Ratio: 22 (ref 12–28)
BUN: 15 mg/dL (ref 8–27)
Bilirubin Total: 0.5 mg/dL (ref 0.0–1.2)
CO2: 25 mmol/L (ref 20–29)
Calcium: 9.1 mg/dL (ref 8.7–10.3)
Chloride: 106 mmol/L (ref 96–106)
Creatinine, Ser: 0.68 mg/dL (ref 0.57–1.00)
GFR calc Af Amer: 104 mL/min/{1.73_m2} (ref >59)
GFR calc non Af Amer: 90 mL/min/{1.73_m2} (ref >59)
Globulin, Total: 2.4 g/dL (ref 1.5–4.5)
Glucose: 93 mg/dL (ref 65–99)
Potassium: 4.2 mmol/L (ref 3.5–5.2)
Sodium: 143 mmol/L (ref 134–144)
Total Protein: 6.6 g/dL (ref 6.0–8.5)

## 2019-06-17 LAB — LIPID PANEL
Chol/HDL Ratio: 3 ratio (ref 0.0–4.4)
Cholesterol, Total: 160 mg/dL (ref 100–199)
HDL: 53 mg/dL (ref >39)
LDL Chol Calc (NIH): 89 mg/dL (ref 0–99)
Triglycerides: 100 mg/dL (ref 0–149)
VLDL Cholesterol Cal: 18 mg/dL (ref 5–40)

## 2019-06-17 LAB — TSH: TSH: 1.38 u[IU]/mL (ref 0.450–4.500)

## 2019-09-19 ENCOUNTER — Ambulatory Visit (INDEPENDENT_AMBULATORY_CARE_PROVIDER_SITE_OTHER): Payer: PPO | Admitting: *Deleted

## 2019-09-19 DIAGNOSIS — Z0000 Encounter for general adult medical examination without abnormal findings: Secondary | ICD-10-CM

## 2019-09-19 NOTE — Progress Notes (Signed)
MEDICARE ANNUAL WELLNESS VISIT  09/19/2019  Telephone Visit Disclaimer This Medicare AWV was conducted by telephone due to national recommendations for restrictions regarding the COVID-19 Pandemic (e.g. social distancing).  I verified, using two identifiers, that I am speaking with Rhonda Daniel or their authorized healthcare agent. I discussed the limitations, risks, security, and privacy concerns of performing an evaluation and management service by telephone and the potential availability of an in-person appointment in the future. The patient expressed understanding and agreed to proceed.   Subjective:  Rhonda Daniel is a 69 y.o. female patient of Janora Norlander, DO who had a Medicare Annual Wellness Visit today via telephone. Rhonda Daniel is Retired and lives alone. she has 0 children. she reports that she is socially active and does interact with friends/family regularly. she is not physically active and enjoys reading and doing jigsaw puzzles.  Patient Care Team: Janora Norlander, DO as PCP - General (Family Medicine) Melina Schools, OD (Optometry) Minus Breeding, MD as Consulting Physician (Cardiology) Alphonsa Overall, MD as Consulting Physician (General Surgery)  Advanced Directives 09/19/2019 09/15/2018 12/29/2016  Does Patient Have a Medical Advance Directive? No Yes No  Type of Advance Directive - Living will -  Does patient want to make changes to medical advance directive? - No - Patient declined -  Would patient like information on creating a medical advance directive? No - Patient declined - Yes (MAU/Ambulatory/Procedural Areas - Information given)    Hospital Utilization Over the Past 12 Months: # of hospitalizations or ER visits: 0 # of surgeries: 0  Review of Systems    Patient reports that her overall health is unchanged compared to last year.  History obtained from chart review  Patient Reported Readings (BP, Pulse, CBG, Weight, etc) none  Pain Assessment Pain  : No/denies pain     Current Medications & Allergies (verified) Allergies as of 09/19/2019      Reactions   Bee Venom    Local redness and swelling       Medication List       Accurate as of September 19, 2019  2:36 PM. If you have any questions, ask your nurse or doctor.        STOP taking these medications   cephALEXin 500 MG capsule Commonly known as: KEFLEX   doxycycline 100 MG tablet Commonly known as: VIBRA-TABS     TAKE these medications   albuterol 108 (90 Base) MCG/ACT inhaler Commonly known as: VENTOLIN HFA Inhale 2 puffs into the lungs every 6 (six) hours as needed for wheezing or shortness of breath.   aspirin 81 MG tablet Take 81 mg by mouth daily.   fluticasone 50 MCG/ACT nasal spray Commonly known as: FLONASE Place 2 sprays into both nostrils daily.   furosemide 20 MG tablet Commonly known as: LASIX Take 1 tablet (20 mg total) by mouth daily.   lisinopril 20 MG tablet Commonly known as: ZESTRIL Take 1 tablet (20 mg total) by mouth daily.   simvastatin 20 MG tablet Commonly known as: ZOCOR Take 1 tablet (20 mg total) by mouth daily at 6 PM.   Vitamin D (Ergocalciferol) 1.25 MG (50000 UNIT) Caps capsule Commonly known as: DRISDOL Take 1 capsule (50,000 Units total) by mouth every 7 (seven) days.       History (reviewed): Past Medical History:  Diagnosis Date  . Anxiety   . Hyperlipidemia   . Hypertension   . Sleep apnea    Was on CPAP.  Does not use now.    Past Surgical History:  Procedure Laterality Date  . ABDOMINAL HYSTERECTOMY    . APPENDECTOMY     with malignancy of appendix  . CHOLECYSTECTOMY    . COLON SURGERY     preventative - with appendix malignancy  . HERNIA REPAIR     Family History  Problem Relation Age of Onset  . Diabetes Mother   . Hypertension Mother   . Irregular heart beat Mother        pacemaker  . Hearing loss Mother        right  . Vision loss Mother        macular degeneration  . Rheumatic fever  Father        heart murmur  . Pneumonia Father   . Heart disease Paternal Aunt   . Cancer Paternal Aunt        tumor behind eye  . Diabetes Maternal Grandmother   . Vision loss Maternal Grandmother   . Heart disease Paternal Grandfather   . Cancer - Cervical Sister    Social History   Socioeconomic History  . Marital status: Single    Spouse name: Not on file  . Number of children: 0  . Years of education: 16  . Highest education level: Master's degree (e.g., MA, MS, MEng, MEd, MSW, MBA)  Occupational History  . Occupation: Retired     Comment: Advice worker   Tobacco Use  . Smoking status: Never Smoker  . Smokeless tobacco: Never Used  Substance and Sexual Activity  . Alcohol use: No  . Drug use: No  . Sexual activity: Not Currently    Birth control/protection: Surgical  Other Topics Concern  . Not on file  Social History Narrative   Lives with patches (cat) with diabetes.    Social Determinants of Health   Financial Resource Strain: Low Risk   . Difficulty of Paying Living Expenses: Not hard at all  Food Insecurity: No Food Insecurity  . Worried About Programme researcher, broadcasting/film/video in the Last Year: Never true  . Ran Out of Food in the Last Year: Never true  Transportation Needs: No Transportation Needs  . Lack of Transportation (Medical): No  . Lack of Transportation (Non-Medical): No  Physical Activity: Inactive  . Days of Exercise per Week: 0 days  . Minutes of Exercise per Session: 0 min  Stress: No Stress Concern Present  . Feeling of Stress : Not at all  Social Connections: Slightly Isolated  . Frequency of Communication with Friends and Family: More than three times a week  . Frequency of Social Gatherings with Friends and Family: More than three times a week  . Attends Religious Services: More than 4 times per year  . Active Member of Clubs or Organizations: Yes  . Attends Banker Meetings: More than 4 times per year  . Marital Status: Never  married    Activities of Daily Living In your present state of health, do you have any difficulty performing the following activities: 09/19/2019  Hearing? N  Vision? N  Comment wears glasses-gets eye exam every 2 years  Difficulty concentrating or making decisions? N  Walking or climbing stairs? Y  Comment due to her right knee/hip pain  Dressing or bathing? N  Doing errands, shopping? N  Preparing Food and eating ? N  Using the Toilet? N  In the past six months, have you accidently leaked urine? N  Comment she has noticed she  gets up more at night to urinate  Do you have problems with loss of bowel control? N  Managing your Medications? N  Managing your Finances? N  Housekeeping or managing your Housekeeping? N  Comment she does have someone that comes during the summer weekly and mows her yard  Some recent data might be hidden    Patient Education/ Literacy How often do you need to have someone help you when you read instructions, pamphlets, or other written materials from your doctor or pharmacy?: 1 - Never What is the last grade level you completed in school?: Masters Degree  Exercise Current Exercise Habits: The patient does not participate in regular exercise at present, Exercise limited by: orthopedic condition(s)  Diet Patient reports consuming 3 meals a day and 2 snack(s) a day Patient reports that her primary diet is: Regular Patient reports that she does have regular access to food.   Depression Screen PHQ 2/9 Scores 09/19/2019 06/16/2019 05/09/2019 09/15/2018 09/14/2018 03/04/2018 12/28/2017  PHQ - 2 Score 1 0 0 0 0 0 0  PHQ- 9 Score - - 0 - - - -     Fall Risk Fall Risk  09/19/2019 06/16/2019 09/15/2018 09/14/2018 03/04/2018  Falls in the past year? 1 0 1 1 No  Number falls in past yr: 0 - 0 0 -  Injury with Fall? 0 - 0 0 -  Follow up - - Education provided;Falls prevention discussed - -     Objective:  Rhonda Daniel seemed alert and oriented and she participated  appropriately during our telephone visit.  Blood Pressure Weight BMI  BP Readings from Last 3 Encounters:  06/16/19 (!) 147/79  05/09/19 131/84  09/15/18 124/74   Wt Readings from Last 3 Encounters:  06/16/19 273 lb 12.8 oz (124.2 kg)  05/09/19 267 lb (121.1 kg)  09/15/18 255 lb (115.7 kg)   BMI Readings from Last 1 Encounters:  06/16/19 42.88 kg/m    *Unable to obtain current vital signs, weight, and BMI due to telephone visit type  Hearing/Vision  . Rhonda Daniel did not seem to have difficulty with hearing/understanding during the telephone conversation . Reports that she has had a formal eye exam by an eye care professional within the past year . Reports that she has not had a formal hearing evaluation within the past year *Unable to fully assess hearing and vision during telephone visit type  Cognitive Function: 6CIT Screen 09/19/2019  What Year? 0 points  What month? 0 points  What time? 0 points  Count back from 20 0 points  Months in reverse 0 points  Repeat phrase 0 points  Total Score 0   (Normal:0-7, Significant for Dysfunction: >8)  Normal Cognitive Function Screening: Yes   Immunization & Health Maintenance Record Immunization History  Administered Date(s) Administered  . Tdap 05/02/2009    Health Maintenance  Topic Date Due  . Hepatitis C Screening  1951-01-03  . PNA vac Low Risk Adult (1 of 2 - PCV13) 06/18/2015  . TETANUS/TDAP  05/03/2019  . INFLUENZA VACCINE  01/26/2020 (Originally 04/08/2019)  . COLONOSCOPY  06/15/2020 (Originally 11/21/2018)  . MAMMOGRAM  10/05/2020  . DEXA SCAN  Completed       Assessment  This is a routine wellness examination for Rhonda Daniel.  Health Maintenance: Due or Overdue Health Maintenance Due  Topic Date Due  . Hepatitis C Screening  1950/09/25  . PNA vac Low Risk Adult (1 of 2 - PCV13) 06/18/2015  . TETANUS/TDAP  05/03/2019  Rhonda Daniel does not need a referral for Community Assistance: Care Management:   no  Social Work:    no Prescription Assistance:  no Nutrition/Diabetes Education:  no   Plan:  Personalized Goals Goals Addressed            This Visit's Progress   . DIET - INCREASE WATER INTAKE       Try to drink 6-8 glasses of water daily      Personalized Health Maintenance & Screening Recommendations  Pt declines all vaccines  Lung Cancer Screening Recommended: no (Low Dose CT Chest recommended if Age 21-80 years, 30 pack-year currently smoking OR have quit w/in past 15 years) Hepatitis C Screening recommended: no HIV Screening recommended: no  Advanced Directives: Written information was not prepared per patient's request.  Referrals & Orders No orders of the defined types were placed in this encounter.   Follow-up Plan . Follow-up with Rhonda Ip, DO as planned   I have personally reviewed and noted the following in the patient's chart:   . Medical and social history . Use of alcohol, tobacco or illicit drugs  . Current medications and supplements . Functional ability and status . Nutritional status . Physical activity . Advanced directives . List of other physicians . Hospitalizations, surgeries, and ER visits in previous 12 months . Vitals . Screenings to include cognitive, depression, and falls . Referrals and appointments  In addition, I have reviewed and discussed with Rhonda Daniel certain preventive protocols, quality metrics, and best practice recommendations. A written personalized care plan for preventive services as well as general preventive health recommendations is available and can be mailed to the patient at her request.      Hessie Diener, LPN  10/21/863

## 2019-09-19 NOTE — Patient Instructions (Signed)
Preventive Care 38 Years and Older, Female Preventive care refers to lifestyle choices and visits with your health care provider that can promote health and wellness. This includes:  A yearly physical exam. This is also called an annual well check.  Regular dental and eye exams.  Immunizations.  Screening for certain conditions.  Healthy lifestyle choices, such as diet and exercise. What can I expect for my preventive care visit? Physical exam Your health care provider will check:  Height and weight. These may be used to calculate body mass index (BMI), which is a measurement that tells if you are at a healthy weight.  Heart rate and blood pressure.  Your skin for abnormal spots. Counseling Your health care provider may ask you questions about:  Alcohol, tobacco, and drug use.  Emotional well-being.  Home and relationship well-being.  Sexual activity.  Eating habits.  History of falls.  Memory and ability to understand (cognition).  Work and work Statistician.  Pregnancy and menstrual history. What immunizations do I need?  Influenza (flu) vaccine  This is recommended every year. Tetanus, diphtheria, and pertussis (Tdap) vaccine  You may need a Td booster every 10 years. Varicella (chickenpox) vaccine  You may need this vaccine if you have not already been vaccinated. Zoster (shingles) vaccine  You may need this after age 33. Pneumococcal conjugate (PCV13) vaccine  One dose is recommended after age 33. Pneumococcal polysaccharide (PPSV23) vaccine  One dose is recommended after age 72. Measles, mumps, and rubella (MMR) vaccine  You may need at least one dose of MMR if you were born in 1957 or later. You may also need a second dose. Meningococcal conjugate (MenACWY) vaccine  You may need this if you have certain conditions. Hepatitis A vaccine  You may need this if you have certain conditions or if you travel or work in places where you may be exposed  to hepatitis A. Hepatitis B vaccine  You may need this if you have certain conditions or if you travel or work in places where you may be exposed to hepatitis B. Haemophilus influenzae type b (Hib) vaccine  You may need this if you have certain conditions. You may receive vaccines as individual doses or as more than one vaccine together in one shot (combination vaccines). Talk with your health care provider about the risks and benefits of combination vaccines. What tests do I need? Blood tests  Lipid and cholesterol levels. These may be checked every 5 years, or more frequently depending on your overall health.  Hepatitis C test.  Hepatitis B test. Screening  Lung cancer screening. You may have this screening every year starting at age 39 if you have a 30-pack-year history of smoking and currently smoke or have quit within the past 15 years.  Colorectal cancer screening. All adults should have this screening starting at age 36 and continuing until age 15. Your health care provider may recommend screening at age 23 if you are at increased risk. You will have tests every 1-10 years, depending on your results and the type of screening test.  Diabetes screening. This is done by checking your blood sugar (glucose) after you have not eaten for a while (fasting). You may have this done every 1-3 years.  Mammogram. This may be done every 1-2 years. Talk with your health care provider about how often you should have regular mammograms.  BRCA-related cancer screening. This may be done if you have a family history of breast, ovarian, tubal, or peritoneal cancers.  Other tests  Sexually transmitted disease (STD) testing.  Bone density scan. This is done to screen for osteoporosis. You may have this done starting at age 44. Follow these instructions at home: Eating and drinking  Eat a diet that includes fresh fruits and vegetables, whole grains, lean protein, and low-fat dairy products. Limit  your intake of foods with high amounts of sugar, saturated fats, and salt.  Take vitamin and mineral supplements as recommended by your health care provider.  Do not drink alcohol if your health care provider tells you not to drink.  If you drink alcohol: ? Limit how much you have to 0-1 drink a day. ? Be aware of how much alcohol is in your drink. In the [place] one drink equals one 12 oz bottle of beer (355 mL), one 5 oz glass of wine (148 mL), or one 1 oz glass of hard liquor (44 mL). Lifestyle  Take daily care of your teeth and gums.  Stay active. Exercise for at least 30 minutes on 5 or more days each week.  Do not use any products that contain nicotine or tobacco, such as cigarettes, e-cigarettes, and chewing tobacco. If you need help quitting, ask your health care provider.  If you are sexually active, practice safe sex. Use a condom or other form of protection in order to prevent STIs (sexually transmitted infections).  Talk with your health care provider about taking a low-dose aspirin or statin. What's next?  Go to your health care provider once a year for a well check visit.  Ask your health care provider how often you should have your eyes and teeth checked.  Stay up to date on all vaccines. This information is not intended to replace advice given to you by your health care provider. Make sure you discuss any questions you have with your health care provider. Document Revised: 08/18/2018 Document Reviewed: 08/18/2018 Elsevier Patient Education  2020 [employer].

## 2019-10-13 ENCOUNTER — Other Ambulatory Visit: Payer: Self-pay

## 2019-10-17 ENCOUNTER — Ambulatory Visit (INDEPENDENT_AMBULATORY_CARE_PROVIDER_SITE_OTHER): Payer: PPO | Admitting: Family Medicine

## 2019-10-17 ENCOUNTER — Encounter: Payer: Self-pay | Admitting: Family Medicine

## 2019-10-17 ENCOUNTER — Other Ambulatory Visit: Payer: Self-pay

## 2019-10-17 VITALS — BP 146/88 | HR 73 | Temp 99.0°F | Ht 67.0 in | Wt 277.0 lb

## 2019-10-17 DIAGNOSIS — E7849 Other hyperlipidemia: Secondary | ICD-10-CM

## 2019-10-17 DIAGNOSIS — Z1231 Encounter for screening mammogram for malignant neoplasm of breast: Secondary | ICD-10-CM | POA: Diagnosis not present

## 2019-10-17 DIAGNOSIS — I10 Essential (primary) hypertension: Secondary | ICD-10-CM

## 2019-10-17 DIAGNOSIS — G8929 Other chronic pain: Secondary | ICD-10-CM | POA: Diagnosis not present

## 2019-10-17 DIAGNOSIS — M25561 Pain in right knee: Secondary | ICD-10-CM

## 2019-10-17 NOTE — Progress Notes (Signed)
Subjective: CC: HTN, HLD PCP: Rhonda Daniel, Rhonda Daniel is a 69 y.o. female presenting to clinic today for:  1.  Hypertension with hyperlipidemia Patient reports compliance with lisinopril 20 mg daily and Zocor 20 mg daily.  No chest pain, shortness of breath, falls or dizziness.  She notes some weight gain and she is trying to cut back to lose it.  2. Knee pain She has had some slight edema in the right knee.  She reports associated pain that is been ongoing for several months.  This seems to be intermittent pain but does require a cane when it does flareup.  Certain movements seem to aggravate it.  She does report some catching but never any locking or popping.  She has not used much for the pain but wanted to get my opinion first.  ROS: Per HPI  Allergies  Allergen Reactions  . Bee Venom     Local redness and swelling    Past Medical History:  Diagnosis Date  . Anxiety   . Hyperlipidemia   . Hypertension   . Sleep apnea    Was on CPAP.   Does not use now.     Current Outpatient Medications:  .  albuterol (VENTOLIN HFA) 108 (90 Base) MCG/ACT inhaler, Inhale 2 puffs into the lungs every 6 (six) hours as needed for wheezing or shortness of breath., Disp: 1 Inhaler, Rfl: 2 .  aspirin 81 MG tablet, Take 81 mg by mouth daily., Disp: , Rfl:  .  fluticasone (FLONASE) 50 MCG/ACT nasal spray, Place 2 sprays into both nostrils daily., Disp: 16 g, Rfl: 6 .  lisinopril (ZESTRIL) 20 MG tablet, Take 1 tablet (20 mg total) by mouth daily., Disp: 90 tablet, Rfl: 3 .  simvastatin (ZOCOR) 20 MG tablet, Take 1 tablet (20 mg total) by mouth daily at 6 PM., Disp: 90 tablet, Rfl: 3 .  Vitamin D, Ergocalciferol, (DRISDOL) 1.25 MG (50000 UT) CAPS capsule, Take 1 capsule (50,000 Units total) by mouth every 7 (seven) days., Disp: 12 capsule, Rfl: 3 Social History   Socioeconomic History  . Marital status: Single    Spouse name: Not on file  . Number of children: 0  . Years of  education: 16  . Highest education level: Master's degree (Rhonda.g., MA, MS, MEng, MEd, MSW, MBA)  Occupational History  . Occupation: Retired     Comment: Advice worker   Tobacco Use  . Smoking status: Never Smoker  . Smokeless tobacco: Never Used  Substance and Sexual Activity  . Alcohol use: No  . Drug use: No  . Sexual activity: Not Currently    Birth control/protection: Surgical  Other Topics Concern  . Not on file  Social History Narrative   Lives with patches (cat) with diabetes.    Social Determinants of Health   Financial Resource Strain: Low Risk   . Difficulty of Paying Living Expenses: Not hard at all  Food Insecurity: No Food Insecurity  . Worried About Programme researcher, broadcasting/film/video in the Last Year: Never true  . Ran Out of Food in the Last Year: Never true  Transportation Needs: No Transportation Needs  . Lack of Transportation (Medical): No  . Lack of Transportation (Non-Medical): No  Physical Activity: Inactive  . Days of Exercise per Week: 0 days  . Minutes of Exercise per Session: 0 min  Stress: No Stress Concern Present  . Feeling of Stress : Not at all  Social Connections: Slightly Isolated  .  Frequency of Communication with Friends and Family: More than three times a week  . Frequency of Social Gatherings with Friends and Family: More than three times a week  . Attends Religious Services: More than 4 times per year  . Active Member of Clubs or Organizations: Yes  . Attends Archivist Meetings: More than 4 times per year  . Marital Status: Never married  Intimate Partner Violence: Not At Risk  . Fear of Current or Ex-Partner: No  . Emotionally Abused: No  . Physically Abused: No  . Sexually Abused: No   Family History  Problem Relation Age of Onset  . Diabetes Mother   . Hypertension Mother   . Irregular heart beat Mother        pacemaker  . Hearing loss Mother        right  . Vision loss Mother        macular degeneration  . Rheumatic fever  Father        heart murmur  . Pneumonia Father   . Heart disease Paternal Aunt   . Cancer Paternal Aunt        tumor behind eye  . Diabetes Maternal Grandmother   . Vision loss Maternal Grandmother   . Heart disease Paternal Grandfather   . Cancer - Cervical Sister     Objective: Office vital signs reviewed. BP (!) 146/88   Pulse 73   Temp 99 F (37.2 C) (Temporal)   Ht 5\' 7"  (1.702 m)   Wt 277 lb (125.6 kg)   SpO2 97%   BMI 43.38 kg/m   Physical Examination:  General: Awake, alert, well nourished, No acute distress HEENT: Normal, sclera white, MMM. No carotid bruits Cardio: regular rate and rhythm, S1S2 heard, no murmurs appreciated Pulm: clear to auscultation bilaterally, no wheezes, rhonchi or rales; normal work of breathing on room air Extremities: warm, well perfused, No cyanosis or clubbing; she has chronic venous stasis discoloration of the legs bilaterally.  +2 pulses bilaterally MSK: Ambulates with a cane.  Right knee: No gross joint effusion, discoloration or palpable bony abnormalities noted.  Exam is limited by habitus.  She has mild tenderness to palpation along the anterior tibial tuberosity.  No joint line tenderness.  No posterior popliteal tenderness.  Assessment/ Plan: 69 y.o. female   1. Essential hypertension Under better control after manual recheck.  Her blood pressure is within range for age  76. Other hyperlipidemia Continue statin  3. Morbid obesity ([place]) She is working on weight loss  4. Chronic pain of right knee I suspect that there is either a meniscal tear versus a patellofemoral syndrome going on.  She has gained a little bit of weight and is working on weight loss.  We discussed ways to improve pain including use of topical NSAID, oral Tylenol and I have given her home physical therapy exercises to use.  If no significant improvement within a month or if significantly worsening, low threshold to obtain x-rays and consideration for  corticosteroid injection.   No orders of the defined types were placed in this encounter.  No orders of the defined types were placed in this encounter.    Rhonda Daniel, Rhonda Daniel [phone]

## 2019-10-17 NOTE — Patient Instructions (Signed)
For your knee: Take tylenol up to 4 times daily Use Voltaren gel (generic is fine) applied to the right knee up to 4 times daily Do the physical therapy exercises I have given you  We discussed that if it is not getting better in about 1 month, call for a knee injection appointment and we will do a steroid shot in your knee.

## 2019-11-19 NOTE — Progress Notes (Signed)
Subjective: CC:  F/u knee pain PCP: Raliegh Ip, DO Rhonda Daniel is a 69 y.o. female presenting to clinic today for:  1. Knee pain Patient reports ongoing right sided lateral knee pain that she describes as intermittent and burning.  Pain seems to be exacerbated by doing things like getting out of the car or rolling over in bed.  Sometimes she has some soft tissue swelling but never any erythema or increased warmth.  She was seen 1 month ago and started topical Voltaren gel over-the-counter.  This did help some but not significantly.  She tried doing the physical therapy exercises for meniscal tear but unfortunately found them too difficult to perform.  She is here for recheck.  ROS: Per HPI  Allergies  Allergen Reactions  . Bee Venom     Local redness and swelling    Past Medical History:  Diagnosis Date  . Anxiety   . Hyperlipidemia   . Hypertension   . Sleep apnea    Was on CPAP.   Does not use now.     Current Outpatient Medications:  .  albuterol (VENTOLIN HFA) 108 (90 Base) MCG/ACT inhaler, Inhale 2 puffs into the lungs every 6 (six) hours as needed for wheezing or shortness of breath., Disp: 1 Inhaler, Rfl: 2 .  aspirin 81 MG tablet, Take 81 mg by mouth daily., Disp: , Rfl:  .  fluticasone (FLONASE) 50 MCG/ACT nasal spray, Place 2 sprays into both nostrils daily., Disp: 16 g, Rfl: 6 .  lisinopril (ZESTRIL) 20 MG tablet, Take 1 tablet (20 mg total) by mouth daily., Disp: 90 tablet, Rfl: 3 .  simvastatin (ZOCOR) 20 MG tablet, Take 1 tablet (20 mg total) by mouth daily at 6 PM., Disp: 90 tablet, Rfl: 3 .  Vitamin D, Ergocalciferol, (DRISDOL) 1.25 MG (50000 UT) CAPS capsule, Take 1 capsule (50,000 Units total) by mouth every 7 (seven) days., Disp: 12 capsule, Rfl: 3 Social History   Socioeconomic History  . Marital status: Single    Spouse name: Not on file  . Number of children: 0  . Years of education: 16  . Highest education level: Master's degree (e.g.,  MA, MS, MEng, MEd, MSW, MBA)  Occupational History  . Occupation: Retired     Comment: Advice worker   Tobacco Use  . Smoking status: Never Smoker  . Smokeless tobacco: Never Used  Substance and Sexual Activity  . Alcohol use: No  . Drug use: No  . Sexual activity: Not Currently    Birth control/protection: Surgical  Other Topics Concern  . Not on file  Social History Narrative   Lives with patches (cat) with diabetes.    Social Determinants of Health   Financial Resource Strain: Low Risk   . Difficulty of Paying Living Expenses: Not hard at all  Food Insecurity: No Food Insecurity  . Worried About Programme researcher, broadcasting/film/video in the Last Year: Never true  . Ran Out of Food in the Last Year: Never true  Transportation Needs: No Transportation Needs  . Lack of Transportation (Medical): No  . Lack of Transportation (Non-Medical): No  Physical Activity: Inactive  . Days of Exercise per Week: 0 days  . Minutes of Exercise per Session: 0 min  Stress: No Stress Concern Present  . Feeling of Stress : Not at all  Social Connections: Slightly Isolated  . Frequency of Communication with Friends and Family: More than three times a week  . Frequency of Social Gatherings with  Friends and Family: More than three times a week  . Attends Religious Services: More than 4 times per year  . Active Member of Clubs or Organizations: Yes  . Attends Archivist Meetings: More than 4 times per year  . Marital Status: Never married  Intimate Partner Violence: Not At Risk  . Fear of Current or Ex-Partner: No  . Emotionally Abused: No  . Physically Abused: No  . Sexually Abused: No   Family History  Problem Relation Age of Onset  . Diabetes Mother   . Hypertension Mother   . Irregular heart beat Mother        pacemaker  . Hearing loss Mother        right  . Vision loss Mother        macular degeneration  . Rheumatic fever Father        heart murmur  . Pneumonia Father   . Heart  disease Paternal Aunt   . Cancer Paternal Aunt        tumor behind eye  . Diabetes Maternal Grandmother   . Vision loss Maternal Grandmother   . Heart disease Paternal Grandfather   . Cancer - Cervical Sister     Objective: Office vital signs reviewed. BP 138/86   Pulse 85   Temp 98.4 F (36.9 C) (Temporal)   Ht 5\' 7"  (1.702 m)   Wt 280 lb 6.4 oz (127.2 kg)   SpO2 97%   BMI 43.92 kg/m   Physical Examination:  General: Awake, alert, well nourished, No acute distress MSK: Ambulates with a cane.  Right knee: No gross joint effusion, discoloration or palpable bony abnormalities noted.  Exam is limited by habitus.  Mild TTP to the lateral knee  Assessment/ Plan: 69 y.o. female   1. Chronic pain of right knee Trial of oral Celebrex twice daily as needed.  Home care directions were reinforced.  X-rays were obtained today.  Corticosteroid injection was offered today but she declined because she has no family at home to help her.  We discussed that if no improvement with the Celebrex, next step would be consideration of corticosteroid injection vs referral orthopedics. - celecoxib (CELEBREX) 100 MG capsule; Take 1 capsule (100 mg total) by mouth 2 (two) times daily. (if needed for pain. Take with food)  Dispense: 60 capsule; Refill: 0 - DG Knee 1-2 Views Right; Future    No orders of the defined types were placed in this encounter.  No orders of the defined types were placed in this encounter.    Rhonda Norlander, DO [place] [phone]

## 2019-11-20 ENCOUNTER — Ambulatory Visit (INDEPENDENT_AMBULATORY_CARE_PROVIDER_SITE_OTHER): Payer: PPO

## 2019-11-20 ENCOUNTER — Other Ambulatory Visit: Payer: Self-pay

## 2019-11-20 ENCOUNTER — Ambulatory Visit (INDEPENDENT_AMBULATORY_CARE_PROVIDER_SITE_OTHER): Payer: PPO | Admitting: Family Medicine

## 2019-11-20 ENCOUNTER — Encounter: Payer: Self-pay | Admitting: Family Medicine

## 2019-11-20 VITALS — BP 138/86 | HR 85 | Temp 98.4°F | Ht 67.0 in | Wt 280.4 lb

## 2019-11-20 DIAGNOSIS — M25561 Pain in right knee: Secondary | ICD-10-CM | POA: Diagnosis not present

## 2019-11-20 DIAGNOSIS — G8929 Other chronic pain: Secondary | ICD-10-CM

## 2019-11-20 DIAGNOSIS — M1711 Unilateral primary osteoarthritis, right knee: Secondary | ICD-10-CM | POA: Diagnosis not present

## 2019-11-20 MED ORDER — CELECOXIB 100 MG PO CAPS: 100.0000 mg | ORAL_CAPSULE | Freq: Two times a day (BID) | ORAL | 0 refills | Status: DC

## 2019-11-20 NOTE — Patient Instructions (Signed)
Call to schedule a knee injection if symptoms do not improve with the celebrex.  You have prescribed a nonsteroidal anti-inflammatory drug (NSAID) today. This will help with your pain and inflammation. Please do not take any other NSAIDs (ibuprofen/Motrin/Advil, naproxen/Aleve, meloxicam/Mobic, Voltaren/diclofenac). Please make sure to eat a meal when taking this medication.   Caution:  If you have a history of acid reflux/indigestion, I recommend that you take an antacid (such as Prilosec, Prevacid) daily while on the NSAID.  If you have a history of bleeding disorder, gastric ulcer, are on a blood thinner (like warfarin/Coumadin, Xarelto, Eliquis, etc) please do not take NSAID.  If you have ever had a heart attack, you should not take NSAIDs.

## 2019-11-22 ENCOUNTER — Telehealth: Payer: Self-pay | Admitting: Family Medicine

## 2019-11-22 NOTE — Chronic Care Management (AMB) (Signed)
  Chronic Care Management   Outreach Note  11/22/2019 Name: Rhonda Daniel MRN: [id number] DOB: 26-Nov-1950  Rhonda Daniel is a 69 y.o. year old female who is a primary care patient of Raliegh Ip, DO. I reached out to Rhonda Daniel by phone today in response to a referral sent by Ms. Rhonda Daniel's health plan.     An unsuccessful telephone outreach was attempted today. The patient was referred to the case management team for assistance with care management and care coordination.   Follow Up Plan: The care management team will reach out to the patient again over the next 7 days.  If patient returns call to provider office, please advise to call Embedded Care Management Care Guide Rhonda Daniel at [phone].  Rhonda Daniel  Care Guide, Embedded Care Coordination [facility]  [place], [place] [address] Direct Dial: [phone] [email or web address]: [email or web address]

## 2019-11-27 NOTE — Chronic Care Management (AMB) (Signed)
  Chronic Care Management   Note  11/27/2019 Name: Rhonda Daniel MRN: [id number] DOB: 11/27/1950  Rhonda Daniel is a 69 y.o. year old female who is a primary care patient of Janora Norlander, DO. I reached out to [place] by phone today in response to a referral sent by Ms. Rhonda Cardinal Aguayo's health plan.     [name] was given information about Chronic Care Management services today including:  1. CCM service includes personalized support from designated clinical staff supervised by her physician, including individualized plan of care and coordination with other care providers 2. 24/7 contact phone numbers for assistance for urgent and routine care needs. 3. Service will only be billed when office clinical staff spend 20 minutes or more in a month to coordinate care. 4. Only one practitioner may furnish and bill the service in a calendar month. 5. The patient may stop CCM services at any time (effective at the end of the month) by phone call to the office staff. 6. The patient will be responsible for cost sharing (co-pay) of up to 20% of the service fee (after annual deductible is met).  Patient agreed to services and verbal consent obtained.   Follow up plan: Telephone appointment with care management team member scheduled for:05/02/2020  [facility]  [place], [place] [address] Direct Dial: [email or web address] Website: [email or web address]

## 2020-01-20 ENCOUNTER — Other Ambulatory Visit: Payer: Self-pay | Admitting: Family Medicine

## 2020-01-25 ENCOUNTER — Telehealth: Payer: Self-pay | Admitting: Family Medicine

## 2020-01-25 NOTE — Telephone Encounter (Signed)
Do you want patient to see you are does she just need labs? She is thinking she just needs the lab? Please asdvise aware you are not back until tomorrow.

## 2020-01-26 ENCOUNTER — Other Ambulatory Visit: Payer: Self-pay | Admitting: Family Medicine

## 2020-01-26 MED ORDER — VITAMIN D (ERGOCALCIFEROL) 1.25 MG (50000 UNIT) PO CAPS: 50000.0000 [IU] | ORAL_CAPSULE | ORAL | 1 refills | Status: DC

## 2020-01-26 NOTE — Telephone Encounter (Signed)
Patient aware, script is ready. 

## 2020-01-26 NOTE — Telephone Encounter (Signed)
We can check at her next visit.  I'll renew the vit d until then.

## 2020-02-27 ENCOUNTER — Other Ambulatory Visit: Payer: Self-pay | Admitting: Family Medicine

## 2020-05-02 ENCOUNTER — Telehealth: Payer: Self-pay | Admitting: *Deleted

## 2020-05-02 ENCOUNTER — Telehealth: Payer: PPO | Admitting: *Deleted

## 2020-05-02 NOTE — Telephone Encounter (Signed)
  Chronic Care Management   Outreach Note  05/02/2020 Name: Rhonda Daniel MRN: [id number] DOB: 1951-04-14  Referred by: Raliegh Ip, DO Reason for referral : Chronic Care Management (Initial Visit)   An unsuccessful Initial Telephone Visit was attempted today. The patient was referred to the case management team for assistance with care management and care coordination.   Clinical Goals: . Over the next 10 days, patient will be contacted by a Care Guide to reschedule their Initial CCM Visit . Over the next 30 days, patient will have an Initial CCM Visit with a member of the embedded CCM team to discuss self-management of their chronic medical conditions  Interventions and Plan . Chart reviewed in preparation for initial visit telephone call . Collaboration with other care team members as needed . Unsuccessful outreach to patient  . Request sent to care guides to reach out and reschedule patient's initial visit   Demetrios Loll, BSN, RN-BC Embedded Chronic Care Manager [facility] Care Management Direct Dial: [phone]

## 2020-05-14 ENCOUNTER — Other Ambulatory Visit: Payer: Self-pay | Admitting: Family Medicine

## 2020-06-07 ENCOUNTER — Other Ambulatory Visit: Payer: Self-pay | Admitting: Family Medicine

## 2020-07-16 ENCOUNTER — Encounter: Payer: Self-pay | Admitting: Family Medicine

## 2020-07-16 ENCOUNTER — Ambulatory Visit (INDEPENDENT_AMBULATORY_CARE_PROVIDER_SITE_OTHER): Payer: PPO | Admitting: Family Medicine

## 2020-07-16 ENCOUNTER — Other Ambulatory Visit: Payer: Self-pay

## 2020-07-16 VITALS — BP 152/78 | HR 67 | Temp 97.9°F | Ht 67.0 in | Wt 285.4 lb

## 2020-07-16 DIAGNOSIS — I10 Essential (primary) hypertension: Secondary | ICD-10-CM | POA: Diagnosis not present

## 2020-07-16 DIAGNOSIS — E559 Vitamin D deficiency, unspecified: Secondary | ICD-10-CM

## 2020-07-16 DIAGNOSIS — E7849 Other hyperlipidemia: Secondary | ICD-10-CM

## 2020-07-16 MED ORDER — SIMVASTATIN 20 MG PO TABS: ORAL_TABLET | ORAL | 3 refills | Status: DC

## 2020-07-16 MED ORDER — VITAMIN D (ERGOCALCIFEROL) 1.25 MG (50000 UNIT) PO CAPS
50000.0000 [IU] | ORAL_CAPSULE | ORAL | 3 refills | Status: DC
Start: 2020-07-16 — End: 2021-08-05

## 2020-07-16 MED ORDER — FLUTICASONE PROPIONATE 50 MCG/ACT NA SUSP
NASAL | 12 refills | Status: DC
Start: 2020-07-16 — End: 2021-08-05

## 2020-07-16 MED ORDER — LISINOPRIL 20 MG PO TABS
20.0000 mg | ORAL_TABLET | Freq: Every day | ORAL | 3 refills | Status: DC
Start: 2020-07-16 — End: 2021-08-05

## 2020-07-16 NOTE — Patient Instructions (Signed)

## 2020-07-16 NOTE — Progress Notes (Signed)
Subjective: CC: Follow-up hypertension, hyperlipidemia PCP: Janora Norlander, DO Rhonda Daniel is a 69 y.o. female presenting to clinic today for:  1.  Hypertension with hyperlipidemia Patient reports compliance with lisinopril 20 mg daily, Zocor 20 mg daily.  No chest pain, shortness of breath, edema.  She admits to weight gain and identifies that this is mostly because she has not been as physically active as she normally is.  Her knee pain is under control but she worries about exacerbating it.  Additionally, she was part of weight watchers previously and has not been involved with this due to COVID-19 restrictions.  She feels that she needs some accountability.  She has a scale for which she can weigh daily at.  2.  Vitamin D deficiency Patient reports compliance with weekly vitamin D.  No weightbearing exercise currently.  ROS: Per HPI  Allergies  Allergen Reactions  . Bee Venom     Local redness and swelling    Past Medical History:  Diagnosis Date  . Anxiety   . Hyperlipidemia   . Hypertension   . Sleep apnea    Was on CPAP.   Does not use now.     Current Outpatient Medications:  .  albuterol (VENTOLIN HFA) 108 (90 Base) MCG/ACT inhaler, Inhale 2 puffs into the lungs every 6 (six) hours as needed for wheezing or shortness of breath., Disp: 1 Inhaler, Rfl: 2 .  aspirin 81 MG tablet, Take 81 mg by mouth daily., Disp: , Rfl:  .  fluticasone (FLONASE) 50 MCG/ACT nasal spray, PLACE 2 SPRAYS INTO BOTH NOSTRILS EVERY DAY, Disp: 16 g, Rfl: 2 .  lisinopril (ZESTRIL) 20 MG tablet, TAKE 1 TABLET EVERY DAY, Disp: 30 tablet, Rfl: 0 .  simvastatin (ZOCOR) 20 MG tablet, TAKE 1 TABLET DAILY AT 6PM, Disp: 30 tablet, Rfl: 0 .  Vitamin D, Ergocalciferol, (DRISDOL) 1.25 MG (50000 UNIT) CAPS capsule, Take 1 capsule (50,000 Units total) by mouth every 7 (seven) days., Disp: 12 capsule, Rfl: 1 .  celecoxib (CELEBREX) 100 MG capsule, Take 1 capsule (100 mg total) by mouth 2 (two) times  daily. (if needed for pain. Take with food) (Patient not taking: Reported on 07/16/2020), Disp: 60 capsule, Rfl: 0 Social History   Socioeconomic History  . Marital status: Single    Spouse name: Not on file  . Number of children: 0  . Years of education: 30  . Highest education level: Master's degree (e.g., MA, MS, MEng, MEd, MSW, MBA)  Occupational History  . Occupation: Retired     Comment: Proofreader   Tobacco Use  . Smoking status: Never Smoker  . Smokeless tobacco: Never Used  Vaping Use  . Vaping Use: Never used  Substance and Sexual Activity  . Alcohol use: No  . Drug use: No  . Sexual activity: Not Currently    Birth control/protection: Surgical  Other Topics Concern  . Not on file  Social History Narrative   Lives with patches (cat) with diabetes.    Social Determinants of Health   Financial Resource Strain: Low Risk   . Difficulty of Paying Living Expenses: Not hard at all  Food Insecurity: No Food Insecurity  . Worried About Charity fundraiser in the Last Year: Never true  . Ran Out of Food in the Last Year: Never true  Transportation Needs: No Transportation Needs  . Lack of Transportation (Medical): No  . Lack of Transportation (Non-Medical): No  Physical Activity: Inactive  . Days  of Exercise per Week: 0 days  . Minutes of Exercise per Session: 0 min  Stress: No Stress Concern Present  . Feeling of Stress : Not at all  Social Connections: Moderately Integrated  . Frequency of Communication with Friends and Family: More than three times a week  . Frequency of Social Gatherings with Friends and Family: More than three times a week  . Attends Religious Services: More than 4 times per year  . Active Member of Clubs or Organizations: Yes  . Attends Archivist Meetings: More than 4 times per year  . Marital Status: Never married  Intimate Partner Violence: Not At Risk  . Fear of Current or Ex-Partner: No  . Emotionally Abused: No  .  Physically Abused: No  . Sexually Abused: No   Family History  Problem Relation Age of Onset  . Diabetes Mother   . Hypertension Mother   . Irregular heart beat Mother        pacemaker  . Hearing loss Mother        right  . Vision loss Mother        macular degeneration  . Rheumatic fever Father        heart murmur  . Pneumonia Father   . Heart disease Paternal Aunt   . Cancer Paternal Aunt        tumor behind eye  . Diabetes Maternal Grandmother   . Vision loss Maternal Grandmother   . Heart disease Paternal Grandfather   . Cancer - Cervical Sister     Objective: Office vital signs reviewed. BP (!) 152/78   Pulse 67   Temp 97.9 F (36.6 C)   Ht '5\' 7"'  (1.702 m)   Wt 285 lb 6.4 oz (129.5 kg)   SpO2 98%   BMI 44.70 kg/m   Physical Examination:  General: Awake, alert, morbidly obese, No acute distress HEENT: Normal; sclera white.  Moist mucous membranes Cardio: regular rate and rhythm, S1S2 heard, no murmurs appreciated Pulm: clear to auscultation bilaterally, no wheezes, rhonchi or rales; normal work of breathing on room air Extremities: warm, well perfused, No edema, cyanosis or clubbing; +2 pulses bilaterally MSK stiff, antalgic gait  Assessment/ Plan: 69 y.o. female   1. Essential hypertension Not controlled but has had some weight gain.  We discussed lifestyle modification.  I offered referral to dietitian but she wants to hold off for now.  I have given her seated chair exercises to get some cardio in.  We discussed limiting salt intake.  Continue current regimen and will follow up in 1 month - CMP14+EGFR  2. Other hyperlipidemia Check fasting lipid panel. - Lipid Panel  3. Vitamin D deficiency Check vitamin D level.  Ergocalciferol 50,000 units weekly prescribed for convenience - VITAMIN D 25 Hydroxy (Vit-D Deficiency, Fractures)   No orders of the defined types were placed in this encounter.  No orders of the defined types were placed in this  encounter.    Janora Norlander, DO [place] [phone]

## 2020-07-17 LAB — CMP14+EGFR
ALT: 15 IU/L (ref 0–32)
AST: 18 IU/L (ref 0–40)
Albumin/Globulin Ratio: 1.8 (ref 1.2–2.2)
Albumin: 4.3 g/dL (ref 3.8–4.8)
Alkaline Phosphatase: 66 IU/L (ref 44–121)
BUN/Creatinine Ratio: 20 (ref 12–28)
BUN: 15 mg/dL (ref 8–27)
Bilirubin Total: 0.4 mg/dL (ref 0.0–1.2)
CO2: 25 mmol/L (ref 20–29)
Calcium: 9.6 mg/dL (ref 8.7–10.3)
Chloride: 107 mmol/L — ABNORMAL HIGH (ref 96–106)
Creatinine, Ser: 0.74 mg/dL (ref 0.57–1.00)
GFR calc Af Amer: 95 mL/min/{1.73_m2} (ref >59)
GFR calc non Af Amer: 82 mL/min/{1.73_m2} (ref >59)
Globulin, Total: 2.4 g/dL (ref 1.5–4.5)
Glucose: 88 mg/dL (ref 65–99)
Potassium: 4.9 mmol/L (ref 3.5–5.2)
Sodium: 146 mmol/L — ABNORMAL HIGH (ref 134–144)
Total Protein: 6.7 g/dL (ref 6.0–8.5)

## 2020-07-17 LAB — LIPID PANEL
Chol/HDL Ratio: 3.5 ratio (ref 0.0–4.4)
Cholesterol, Total: 173 mg/dL (ref 100–199)
HDL: 50 mg/dL (ref >39)
LDL Chol Calc (NIH): 104 mg/dL — ABNORMAL HIGH (ref 0–99)
Triglycerides: 102 mg/dL (ref 0–149)
VLDL Cholesterol Cal: 19 mg/dL (ref 5–40)

## 2020-07-17 LAB — VITAMIN D 25 HYDROXY (VIT D DEFICIENCY, FRACTURES): Vit D, 25-Hydroxy: 42.8 ng/mL (ref 30.0–100.0)

## 2020-08-08 NOTE — Telephone Encounter (Signed)
Rescheduled for 08/27/2020 with Demetrios Loll RN CM Initial outreach call

## 2020-08-08 NOTE — Telephone Encounter (Signed)
R/s

## 2020-08-09 ENCOUNTER — Other Ambulatory Visit: Payer: Self-pay | Admitting: Family Medicine

## 2020-08-09 DIAGNOSIS — Z1231 Encounter for screening mammogram for malignant neoplasm of breast: Secondary | ICD-10-CM

## 2020-08-15 ENCOUNTER — Telehealth: Payer: Self-pay

## 2020-08-15 ENCOUNTER — Encounter: Payer: Self-pay | Admitting: Family Medicine

## 2020-08-15 ENCOUNTER — Ambulatory Visit: Payer: PPO | Admitting: Family Medicine

## 2020-08-27 ENCOUNTER — Ambulatory Visit: Payer: PPO | Admitting: *Deleted

## 2020-08-27 DIAGNOSIS — E7849 Other hyperlipidemia: Secondary | ICD-10-CM

## 2020-08-27 DIAGNOSIS — I10 Essential (primary) hypertension: Secondary | ICD-10-CM

## 2020-08-27 NOTE — Patient Instructions (Signed)
°  Plan:   The patient has been provided with contact information for the care management team and has been advised to call if they would like to re-enroll in the CCM program to receive care management and care coordination services.   CCM enrollment status changed to "previously enrolled" as per patient request on 08/27/2020  to discontinue enrollment. Case closed to case management services in primary care home.   Patient was provided with general disease management education and encouraged to follow-up with their PCP and specialists as recommended.   Patient will check and record her blood pressure at least 3 times a week and bring readings to her next PCP visit for review  Demetrios Loll, BSN, RN-BC Embedded Chronic Care Manager [facility] / [facility] Care Management Direct Dial: [phone]

## 2020-08-27 NOTE — Chronic Care Management (AMB) (Signed)
  Chronic Care Management   Initial Visit Note  08/27/2020 Name: Rhonda Daniel MRN: [id number] DOB: Aug 28, 1951  Referred by: Raliegh Ip, DO Reason for referral : Chronic Care Management (Initial Visit)   Rhonda Daniel is a 69 y.o. year old female who is a primary care patient of Raliegh Ip, DO. The CCM team was consulted for assistance with chronic disease management and care coordination needs related to HTN and HLD.  Review of patient status, including review of consultants reports, relevant laboratory and other test results was performed prior to outreach.   I spoke with Rhonda Daniel by telephone today regarding management of their chronic medical conditions. They do not have any CCM or resource needs and feel that their medical conditions are well managed. They appreciated the outreach, but do not feel that CCM services are needed at this time. They will reach out in the future if a need arises. Recommended that she check her blood pressure at least 3 times a week and bring record to next PCP visit for review.   SDOH (Social Determinants of Health) assessments performed: Yes See Care Plan activities for detailed interventions related to SDOH     Plan:  . The patient has been provided with contact information for the care management team and has been advised to call if they would like to re-enroll in the CCM program to receive care management and care coordination services.  . CCM enrollment status changed to "previously enrolled" as per patient request on 08/27/2020  to discontinue enrollment. Case closed to case management services in primary care home.  . Patient was provided with general disease management education and encouraged to follow-up with their PCP and specialists as recommended.  . Patient will check and record her blood pressure at least 3 times a week and bring readings to her next PCP visit for review  Demetrios Loll, BSN, RN-BC Embedded Chronic Care  Manager [facility] / [facility] Care Management Direct Dial: [phone]

## 2020-09-19 ENCOUNTER — Ambulatory Visit (INDEPENDENT_AMBULATORY_CARE_PROVIDER_SITE_OTHER): Payer: PPO

## 2020-09-19 DIAGNOSIS — Z0000 Encounter for general adult medical examination without abnormal findings: Secondary | ICD-10-CM

## 2020-09-19 NOTE — Progress Notes (Signed)
MEDICARE ANNUAL WELLNESS VISIT  09/19/2020  Telephone Visit Disclaimer This Medicare AWV was conducted by telephone due to national recommendations for restrictions regarding the COVID-19 Pandemic (e.g. social distancing).  I verified, using two identifiers, that I am speaking with Rhonda Daniel or their authorized healthcare agent. I discussed the limitations, risks, security, and privacy concerns of performing an evaluation and management service by telephone and the potential availability of an in-person appointment in the future. The patient expressed understanding and agreed to proceed.  Location of Patient: Home Location of Provider (nurse):  [facility]  Subjective:    Rhonda Daniel is a 70 y.o. female patient of Rhonda Ip, DO who had a Medicare Annual Wellness Visit today via telephone. Kesleigh is Retired and lives alone. she has no children. she reports that she is socially active and does interact with friends/family regularly. she is minimally physically active and enjoys reading, painting, and doing crafts.  Patient Care Team: Rhonda Ip, DO as PCP - General (Family Medicine) Derryl Harbor, OD (Optometry) Rollene Rotunda, MD as Consulting Physician (Cardiology) Ovidio Kin, MD as Consulting Physician (General Surgery)  Advanced Directives 09/19/2020 09/19/2019 09/15/2018 12/29/2016  Does Patient Have a Medical Advance Directive? Yes No Yes No  Type of Advance Directive Living will - Living will -  Does patient want to make changes to medical advance directive? No - Patient declined - No - Patient declined -  Would patient like information on creating a medical advance directive? - No - Patient declined - Yes (MAU/Ambulatory/Procedural Areas - Information given)    Hospital Utilization Over the Past 12 Months: # of hospitalizations or ER visits: 0 # of surgeries: 0  Review of Systems    Patient reports that her overall health is unchanged compared to last  year.  History obtained from chart review and the patient  Patient Reported Readings (BP, Pulse, CBG, Weight, etc) none  Pain Assessment Pain : No/denies pain     Current Medications & Allergies (verified) Allergies as of 09/19/2020      Reactions   Bee Venom    Local redness and swelling       Medication List       Accurate as of September 19, 2020  2:03 PM. If you have any questions, ask your nurse or doctor.        albuterol 108 (90 Base) MCG/ACT inhaler Commonly known as: VENTOLIN HFA Inhale 2 puffs into the lungs every 6 (six) hours as needed for wheezing or shortness of breath.   aspirin 81 MG tablet Take 81 mg by mouth daily.   fluticasone 50 MCG/ACT nasal spray Commonly known as: FLONASE PLACE 2 SPRAYS INTO BOTH NOSTRILS EVERY DAY   lisinopril 20 MG tablet Commonly known as: ZESTRIL Take 1 tablet (20 mg total) by mouth daily.   simvastatin 20 MG tablet Commonly known as: ZOCOR TAKE 1 TABLET DAILY AT 6PM   Vitamin D (Ergocalciferol) 1.25 MG (50000 UNIT) Caps capsule Commonly known as: DRISDOL Take 1 capsule (50,000 Units total) by mouth every 7 (seven) days.       History (reviewed): Past Medical History:  Diagnosis Date  . Anxiety   . Hyperlipidemia   . Hypertension   . Sleep apnea    Was on CPAP.   Does not use now.    Past Surgical History:  Procedure Laterality Date  . ABDOMINAL HYSTERECTOMY    . APPENDECTOMY     with malignancy of appendix  .  CHOLECYSTECTOMY    . COLON SURGERY     preventative - with appendix malignancy  . HERNIA REPAIR     Family History  Problem Relation Age of Onset  . Diabetes Mother   . Hypertension Mother   . Irregular heart beat Mother        pacemaker  . Hearing loss Mother        right  . Vision loss Mother        macular degeneration  . Rheumatic fever Father        heart murmur  . Pneumonia Father   . Heart disease Paternal Aunt   . Cancer Paternal Aunt        tumor behind eye  . Diabetes  Maternal Grandmother   . Vision loss Maternal Grandmother   . Heart disease Paternal Grandfather   . Cancer - Cervical Sister    Social History   Socioeconomic History  . Marital status: Single    Spouse name: Not on file  . Number of children: 0  . Years of education: 16  . Highest education level: Master's degree (e.g., MA, MS, MEng, MEd, MSW, MBA)  Occupational History  . Occupation: Retired     Comment: Advice worker   Tobacco Use  . Smoking status: Never Smoker  . Smokeless tobacco: Never Used  Vaping Use  . Vaping Use: Never used  Substance and Sexual Activity  . Alcohol use: No  . Drug use: No  . Sexual activity: Not Currently    Birth control/protection: Surgical  Other Topics Concern  . Not on file  Social History Narrative   Lives with patches (cat) with diabetes.    Social Determinants of Health   Financial Resource Strain: Low Risk   . Difficulty of Paying Living Expenses: Not hard at all  Food Insecurity: No Food Insecurity  . Worried About Programme researcher, broadcasting/film/video in the Last Year: Never true  . Ran Out of Food in the Last Year: Never true  Transportation Needs: No Transportation Needs  . Lack of Transportation (Medical): No  . Lack of Transportation (Non-Medical): No  Physical Activity: Not on file  Stress: Not on file  Social Connections: Moderately Integrated  . Frequency of Communication with Friends and Family: More than three times a week  . Frequency of Social Gatherings with Friends and Family: More than three times a week  . Attends Religious Services: More than 4 times per year  . Active Member of Clubs or Organizations: Yes  . Attends Banker Meetings: More than 4 times per year  . Marital Status: Never married    Activities of Daily Living In your present state of health, do you have any difficulty performing the following activities: 09/19/2020  Hearing? N  Vision? N  Difficulty concentrating or making decisions? N  Walking  or climbing stairs? Y  Comment sometimes when walking has knee pain  Dressing or bathing? N  Doing errands, shopping? N  Preparing Food and eating ? N  Using the Toilet? N  In the past six months, have you accidently leaked urine? N  Do you have problems with loss of bowel control? N  Managing your Medications? N  Managing your Finances? N  Housekeeping or managing your Housekeeping? N  Some recent data might be hidden    Patient Education/ Literacy How often do you need to have someone help you when you read instructions, pamphlets, or other written materials from your doctor or pharmacy?:  1 - Never What is the last grade level you completed in school?: Master's Degree  Exercise Current Exercise Habits: The patient does not participate in regular exercise at present, Exercise limited by: orthopedic condition(s)  Diet Patient reports consuming 3 meals a day and 1 snack(s) a day Patient reports that her primary diet is: Low fat Patient reports that she does have regular access to food.   Depression Screen PHQ 2/9 Scores 09/19/2020 07/16/2020 11/20/2019 10/17/2019 09/19/2019 06/16/2019 05/09/2019  PHQ - 2 Score 0 0 0 2 1 0 0  PHQ- 9 Score - - - 2 - - 0     Fall Risk Fall Risk  09/19/2020 07/16/2020 11/20/2019 10/17/2019 09/19/2019  Falls in the past year? 1 0 1 1 1   Number falls in past yr: 1 - 0 0 0  Injury with Fall? 0 - 0 0 0  Risk for fall due to : History of fall(s);Impaired mobility - - - -  Follow up Falls evaluation completed - - - -     Objective:  Rhonda SineNancy E Skowronek seemed alert and oriented and she participated appropriately during our telephone visit.  Blood Pressure Weight BMI  BP Readings from Last 3 Encounters:  07/16/20 (!) 152/78  11/20/19 138/86  10/17/19 (!) 146/88   Wt Readings from Last 3 Encounters:  07/16/20 285 lb 6.4 oz (129.5 kg)  11/20/19 280 lb 6.4 oz (127.2 kg)  10/17/19 277 lb (125.6 kg)   BMI Readings from Last 1 Encounters:  07/16/20 44.70 kg/m     *Unable to obtain current vital signs, weight, and BMI due to telephone visit type  Hearing/Vision  . Rhonda Sineancy did not seem to have difficulty with hearing/understanding during the telephone conversation . Reports that she has had a formal eye exam by an eye care professional within the past year . Reports that she has not had a formal hearing evaluation within the past year *Unable to fully assess hearing and vision during telephone visit type  Cognitive Function: 6CIT Screen 09/19/2020 09/19/2019  What Year? 0 points 0 points  What month? 0 points 0 points  What time? 0 points 0 points  Count back from 20 0 points 0 points  Months in reverse 0 points 0 points  Repeat phrase 0 points 0 points  Total Score 0 0   (Normal:0-7, Significant for Dysfunction: >8)  Normal Cognitive Function Screening: Yes   Immunization & Health Maintenance Record Immunization History  Administered Date(s) Administered  . Moderna Sars-Covid-2 Vaccination 10/12/2019, 11/09/2019, 07/30/2020  . Tdap 05/02/2009    Health Maintenance  Topic Date Due  . Hepatitis C Screening  Never done  . COLONOSCOPY (Pts [phone] Insurance coverage will need to be confirmed)  11/21/2018  . TETANUS/TDAP  10/16/2020 (Originally 05/03/2019)  . PNA vac Low Risk Adult (1 of 2 - PCV13) 10/16/2020 (Originally 06/18/2015)  . INFLUENZA VACCINE  12/05/2020 (Originally 04/07/2020)  . MAMMOGRAM  10/16/2021  . DEXA SCAN  Completed  . COVID-19 Vaccine  Completed       Assessment  This is a routine wellness examination for Rhonda Daniel.  Health Maintenance: Due or Overdue Health Maintenance Due  Topic Date Due  . Hepatitis C Screening  Never done  . COLONOSCOPY (Pts [phone] Insurance coverage will need to be confirmed)  11/21/2018    Rhonda SkyeNancy E Putman does not need a referral for Community Assistance: Care Management:   no Social Work:    no Prescription Assistance:  no Nutrition/Diabetes Education:  no  Plan:  Personalized  Goals Goals Addressed            This Visit's Progress   . Patient Stated       09/19/2020 AWV Goal: Exercise for General Health   Patient will verbalize understanding of the benefits of increased physical activity:  Exercising regularly is important. It will improve your overall fitness, flexibility, and endurance.  Regular exercise also will improve your overall health. It can help you control your weight, reduce stress, and improve your bone density.  Over the next year, patient will increase physical activity as tolerated with a goal of at least 150 minutes of moderate physical activity per week.   You can tell that you are exercising at a moderate intensity if your heart starts beating faster and you start breathing faster but can still hold a conversation.  Moderate-intensity exercise ideas include:  Walking 1 mile (1.6 km) in about 15 minutes  Biking  Hiking  Golfing  Dancing  Water aerobics  Patient will verbalize understanding of everyday activities that increase physical activity by providing examples like the following: ? Yard work, such as: ? Pushing a Surveyor, mining ? Raking and bagging leaves ? Washing your car ? Pushing a stroller ? Shoveling snow ? Gardening ? Washing windows or floors  Patient will be able to explain general safety guidelines for exercising:   Before you start a new exercise program, talk with your health care provider.  Do not exercise so much that you hurt yourself, feel dizzy, or get very short of breath.  Wear comfortable clothes and wear shoes with good support.  Drink plenty of water while you exercise to prevent dehydration or heat stroke.  Work out until your breathing and your heartbeat get faster.       Personalized Health Maintenance & Screening Recommendations  Pneumococcal vaccine  Influenza vaccine  Lung Cancer Screening Recommended: no (Low Dose CT Chest recommended if Age 64-80 years, 30 pack-year currently  smoking OR have quit w/in past 15 years) Hepatitis C Screening recommended: no HIV Screening recommended: no  Advanced Directives: Written information was not prepared per patient's request.  Referrals & Orders No orders of the defined types were placed in this encounter.   Follow-up Plan . Follow-up with Rhonda Ip, DO as planned . Schedule Pneumonia vaccines  I have personally reviewed and noted the following in the patient's chart:   . Medical and social history . Use of alcohol, tobacco or illicit drugs  . Current medications and supplements . Functional ability and status . Nutritional status . Physical activity . Advanced directives . List of other physicians . Hospitalizations, surgeries, and ER visits in previous 12 months . Vitals . Screenings to include cognitive, depression, and falls . Referrals and appointments  In addition, I have reviewed and discussed with Rhonda Sine E Schaberg certain preventive protocols, quality metrics, and best practice recommendations. A written personalized care plan for preventive services as well as general preventive health recommendations is available and can be mailed to the patient at her request.      Mariam Dollar, LPN    12/04/5186   Patient declines after visit summary.

## 2020-09-25 ENCOUNTER — Ambulatory Visit: Payer: PPO | Admitting: Family Medicine

## 2020-10-01 ENCOUNTER — Ambulatory Visit (INDEPENDENT_AMBULATORY_CARE_PROVIDER_SITE_OTHER): Payer: PPO | Admitting: Family Medicine

## 2020-10-01 ENCOUNTER — Encounter: Payer: Self-pay | Admitting: Family Medicine

## 2020-10-01 ENCOUNTER — Other Ambulatory Visit: Payer: Self-pay

## 2020-10-01 DIAGNOSIS — I10 Essential (primary) hypertension: Secondary | ICD-10-CM | POA: Diagnosis not present

## 2020-10-01 DIAGNOSIS — L821 Other seborrheic keratosis: Secondary | ICD-10-CM | POA: Diagnosis not present

## 2020-10-01 NOTE — Progress Notes (Signed)
Subjective: CC: Obesity PCP: Raliegh Ip, DO ZOX:WRUEA E Ingerson is a 70 y.o. female presenting to clinic today for:  1.  Obesity/ HTN Patient reports that she really has been trying to modify her diet.  Compliant with antihypertensives. She typically consumes:  Breakfast: 2 boiled eggs, toast/ muffin, margarine, 8 oz OJ; 3/4 cup cereal with banana Lunch: Grilled chicken salad, carrots, lite dressing; 2 slices of bread w/ chicken or cheese, crackers, [employer] chips Dinner: grilled chicken/ pork, canned vegetables  Snacks: crackers, cookes, diet drinks  Water: not much  2. Skin lesion Patient repots a skin lesion on right side of back that she noticed in December.  She reports concern about this.  Denies bleeding, irritation.   ROS: Per HPI  Allergies  Allergen Reactions  . Bee Venom     Local redness and swelling    Past Medical History:  Diagnosis Date  . Anxiety   . Hyperlipidemia   . Hypertension   . Sleep apnea    Was on CPAP.   Does not use now.     Current Outpatient Medications:  .  albuterol (VENTOLIN HFA) 108 (90 Base) MCG/ACT inhaler, Inhale 2 puffs into the lungs every 6 (six) hours as needed for wheezing or shortness of breath., Disp: 1 Inhaler, Rfl: 2 .  aspirin 81 MG tablet, Take 81 mg by mouth daily., Disp: , Rfl:  .  fluticasone (FLONASE) 50 MCG/ACT nasal spray, PLACE 2 SPRAYS INTO BOTH NOSTRILS EVERY DAY, Disp: 16 g, Rfl: 12 .  lisinopril (ZESTRIL) 20 MG tablet, Take 1 tablet (20 mg total) by mouth daily., Disp: 90 tablet, Rfl: 3 .  simvastatin (ZOCOR) 20 MG tablet, TAKE 1 TABLET DAILY AT 6PM, Disp: 90 tablet, Rfl: 3 .  Vitamin D, Ergocalciferol, (DRISDOL) 1.25 MG (50000 UNIT) CAPS capsule, Take 1 capsule (50,000 Units total) by mouth every 7 (seven) days., Disp: 12 capsule, Rfl: 3 Social History   Socioeconomic History  . Marital status: Single    Spouse name: Not on file  . Number of children: 0  . Years of education: 16  . Highest education  level: Master's degree (e.g., MA, MS, MEng, MEd, MSW, MBA)  Occupational History  . Occupation: Retired     Comment: Advice worker   Tobacco Use  . Smoking status: Never Smoker  . Smokeless tobacco: Never Used  Vaping Use  . Vaping Use: Never used  Substance and Sexual Activity  . Alcohol use: No  . Drug use: No  . Sexual activity: Not Currently    Birth control/protection: Surgical  Other Topics Concern  . Not on file  Social History Narrative   Lives with patches (cat) with diabetes.    Social Determinants of Health   Financial Resource Strain: Low Risk   . Difficulty of Paying Living Expenses: Not hard at all  Food Insecurity: No Food Insecurity  . Worried About Programme researcher, broadcasting/film/video in the Last Year: Never true  . Ran Out of Food in the Last Year: Never true  Transportation Needs: No Transportation Needs  . Lack of Transportation (Medical): No  . Lack of Transportation (Non-Medical): No  Physical Activity: Not on file  Stress: Not on file  Social Connections: Moderately Integrated  . Frequency of Communication with Friends and Family: More than three times a week  . Frequency of Social Gatherings with Friends and Family: More than three times a week  . Attends Religious Services: More than 4 times per year  .  Active Member of Clubs or Organizations: Yes  . Attends Banker Meetings: More than 4 times per year  . Marital Status: Never married  Intimate Partner Violence: Not on file   Family History  Problem Relation Age of Onset  . Diabetes Mother   . Hypertension Mother   . Irregular heart beat Mother        pacemaker  . Hearing loss Mother        right  . Vision loss Mother        macular degeneration  . Rheumatic fever Father        heart murmur  . Pneumonia Father   . Heart disease Paternal Aunt   . Cancer Paternal Aunt        tumor behind eye  . Diabetes Maternal Grandmother   . Vision loss Maternal Grandmother   . Heart disease Paternal  Grandfather   . Cancer - Cervical Sister     Objective: Office vital signs reviewed. BP (!) 145/82   Pulse 67   Temp (!) 97.5 F (36.4 C)   Resp 20   Ht 5\' 7"  (1.702 m)   Wt 282 lb (127.9 kg)   SpO2 99%   BMI 44.17 kg/m   Physical Examination:  General: Awake, alert, obese, No acute distress HEENT: Normal, sclera white, MMM Cardio: regular rate and rhythm, S1S2 heard, no murmurs appreciated Pulm: clear to auscultation bilaterally, no wheezes, rhonchi or rales; normal work of breathing on room air Skin: pigmented, stuck on appearing lesion on right mid back.  Assessment/ Plan: 70 y.o. female   Morbid obesity (HCC)  Essential hypertension  Seborrheic keratoses  Advised to increase water. Drink 1 glass before each meal.  Consider increasing protein to help avoid snacking.  Replace carb rich snacks with healthy reduced carb alternatives  BP well controlled.  Lesion is a seborrheic keratosis.  Reassured benign nature.  Handout provided.  No orders of the defined types were placed in this encounter.  No orders of the defined types were placed in this encounter.  Total time spent with patient 32 minutes.  Greater than 50% of encounter spent in coordination of care/counseling.   65, DO [facility] [phone]

## 2020-10-01 NOTE — Patient Instructions (Signed)
Increase water (consider the "ICE" carbonated waters to replace your sodas) Drink a glass of water before each meal Reduce snacking, especially at night time. Get at least 30 minutes of physical activity in (walking, chair exercise, whatever gets your body moving) Get a "mediterranean diet" cook book from [employer].  This is a great reference for healthy eating!  Seborrheic Keratosis A seborrheic keratosis is a common, noncancerous (benign) skin growth. These growths are velvety, waxy, rough, tan, brown, or black spots that appear on the skin. These skin growths can be flat or raised, and scaly. What are the causes? The cause of this condition is not known. What increases the risk? You are more likely to develop this condition if you:  Have a family history of seborrheic keratosis.  Are 50 or older.  Are pregnant.  Have had estrogen replacement therapy. What are the signs or symptoms? Symptoms of this condition include growths on the face, chest, shoulders, back, or other areas. These growths:  Are usually painless, but may become irritated and itchy.  Can be yellow, brown, black, or other colors.  Are slightly raised or have a flat surface.  Are sometimes rough or wart-like in texture.  Are often velvety or waxy on the surface.  Are round or oval-shaped.  Often occur in groups, but may occur as a single growth.   How is this diagnosed? This condition is diagnosed with a medical history and physical exam.  A sample of the growth may be tested (skin biopsy).  You may need to see a skin specialist (dermatologist). How is this treated? Treatment is not usually needed for this condition, unless the growths are irritated or bleed often.  You may also choose to have the growths removed if you do not like their appearance. ? Most commonly, these growths are treated with a procedure in which liquid nitrogen is applied to "freeze" off the growth (cryosurgery). ? They may also  be burned off with electricity (electrocautery) or removed by scraping (curettage). Follow these instructions at home:  Watch your growth for any changes.  Keep all follow-up visits as told by your health care provider. This is important.  Do not scratch or pick at the growth or growths. This can cause them to become irritated or infected. Contact a health care provider if:  You suddenly have many new growths.  Your growth bleeds, itches, or hurts.  Your growth suddenly becomes larger or changes color. Summary  A seborrheic keratosis is a common, noncancerous (benign) skin growth.  Treatment is not usually needed for this condition, unless the growths are irritated or bleed often.  Watch your growth for any changes.  Contact a health care provider if you suddenly have many new growths or your growth suddenly becomes larger or changes color.  Keep all follow-up visits as told by your health care provider. This is important. This information is not intended to replace advice given to you by your health care provider. Make sure you discuss any questions you have with your health care provider. Document Revised: 01/06/2018 Document Reviewed: 01/06/2018 Elsevier Patient Education  2021 [employer].

## 2020-10-30 ENCOUNTER — Other Ambulatory Visit: Payer: Self-pay

## 2020-10-30 ENCOUNTER — Ambulatory Visit
Admission: RE | Admit: 2020-10-30 | Discharge: 2020-10-30 | Disposition: A | Discharge status: Home / Self Care | Payer: PPO | Source: Ambulatory Visit | Attending: Family Medicine | Admitting: Family Medicine

## 2020-10-30 DIAGNOSIS — Z1231 Encounter for screening mammogram for malignant neoplasm of breast: Secondary | ICD-10-CM | POA: Diagnosis not present

## 2020-11-13 ENCOUNTER — Other Ambulatory Visit: Payer: Self-pay

## 2020-11-13 ENCOUNTER — Encounter: Payer: Self-pay | Admitting: Family Medicine

## 2020-11-13 ENCOUNTER — Ambulatory Visit (INDEPENDENT_AMBULATORY_CARE_PROVIDER_SITE_OTHER): Payer: PPO | Admitting: Family Medicine

## 2020-11-13 DIAGNOSIS — R600 Localized edema: Secondary | ICD-10-CM | POA: Diagnosis not present

## 2020-11-13 DIAGNOSIS — E7849 Other hyperlipidemia: Secondary | ICD-10-CM

## 2020-11-13 DIAGNOSIS — I10 Essential (primary) hypertension: Secondary | ICD-10-CM

## 2020-11-13 MED ORDER — HYDROCHLOROTHIAZIDE 12.5 MG PO CAPS: 12.5000 mg | ORAL_CAPSULE | Freq: Every day | ORAL | 1 refills | Status: DC | PRN

## 2020-11-13 NOTE — Patient Instructions (Signed)
I have sent in a diuretic which will help with your swelling and some with your blood pressure.  We discussed keeping your legs elevated and/or performing the pumping action of your calves and feet to keep the fluid off of them  See Dr Dalbert Garnet for your weight. See me about 3 months after you get started with her She will get a bunch of labs the first visit and probably repeat them during later visits.

## 2020-11-13 NOTE — Progress Notes (Signed)
Subjective: CC: obesity, HTN, HLD PCP: Raliegh Ip, DO BJY:NWGNF Rhonda Daniel is a 70 y.o. female presenting to clinic today for:  1. Obesity/ HTN/ HLD She has started switching out some of her carbs for keto carbs.  She reports compliance with her medications but notes that she has been having some swelling in the left foot.  She sometimes has it in the right but the left is always bigger.  This seems to get better with elevation.  Symptoms are worse after the end of the day.  She denies any excessive salt intake.  She does admit that she is been sitting a lot more watching the Olympics and basketball.  ROS: Per HPI  Allergies  Allergen Reactions  . Bee Venom     Local redness and swelling    Past Medical History:  Diagnosis Date  . Anxiety   . Hyperlipidemia   . Hypertension   . Sleep apnea    Was on CPAP.   Does not use now.     Current Outpatient Medications:  .  albuterol (VENTOLIN HFA) 108 (90 Base) MCG/ACT inhaler, Inhale 2 puffs into the lungs every 6 (six) hours as needed for wheezing or shortness of breath., Disp: 1 Inhaler, Rfl: 2 .  aspirin 81 MG tablet, Take 81 mg by mouth daily., Disp: , Rfl:  .  fluticasone (FLONASE) 50 MCG/ACT nasal spray, PLACE 2 SPRAYS INTO BOTH NOSTRILS EVERY DAY, Disp: 16 g, Rfl: 12 .  lisinopril (ZESTRIL) 20 MG tablet, Take 1 tablet (20 mg total) by mouth daily., Disp: 90 tablet, Rfl: 3 .  simvastatin (ZOCOR) 20 MG tablet, TAKE 1 TABLET DAILY AT 6PM, Disp: 90 tablet, Rfl: 3 .  Vitamin D, Ergocalciferol, (DRISDOL) 1.25 MG (50000 UNIT) CAPS capsule, Take 1 capsule (50,000 Units total) by mouth every 7 (seven) days., Disp: 12 capsule, Rfl: 3 Social History   Socioeconomic History  . Marital status: Single    Spouse name: Not on file  . Number of children: 0  . Years of education: 16  . Highest education level: Master's degree (Rhonda.g., MA, MS, MEng, MEd, MSW, MBA)  Occupational History  . Occupation: Retired     Comment: Advice worker    Tobacco Use  . Smoking status: Never Smoker  . Smokeless tobacco: Never Used  Vaping Use  . Vaping Use: Never used  Substance and Sexual Activity  . Alcohol use: No  . Drug use: No  . Sexual activity: Not Currently    Birth control/protection: Surgical  Other Topics Concern  . Not on file  Social History Narrative   Lives with patches (cat) with diabetes.    Social Determinants of Health   Financial Resource Strain: Low Risk   . Difficulty of Paying Living Expenses: Not hard at all  Food Insecurity: No Food Insecurity  . Worried About Programme researcher, broadcasting/film/video in the Last Year: Never true  . Ran Out of Food in the Last Year: Never true  Transportation Needs: No Transportation Needs  . Lack of Transportation (Medical): No  . Lack of Transportation (Non-Medical): No  Physical Activity: Not on file  Stress: Not on file  Social Connections: Moderately Integrated  . Frequency of Communication with Friends and Family: More than three times a week  . Frequency of Social Gatherings with Friends and Family: More than three times a week  . Attends Religious Services: More than 4 times per year  . Active Member of Clubs or Organizations: Yes  .  Attends Banker Meetings: More than 4 times per year  . Marital Status: Never married  Intimate Partner Violence: Not on file   Family History  Problem Relation Age of Onset  . Diabetes Mother   . Hypertension Mother   . Irregular heart beat Mother        pacemaker  . Hearing loss Mother        right  . Vision loss Mother        macular degeneration  . Rheumatic fever Father        heart murmur  . Pneumonia Father   . Heart disease Paternal Aunt   . Cancer Paternal Aunt        tumor behind eye  . Diabetes Maternal Grandmother   . Vision loss Maternal Grandmother   . Heart disease Paternal Grandfather   . Cancer - Cervical Sister     Objective: Office vital signs reviewed. BP (!) 141/80   Pulse 74   Temp 98 F (36.7  C) (Temporal)   Ht 5\' 7"  (1.702 m)   Wt 281 lb (127.5 kg)   SpO2 96%   BMI 44.01 kg/m   Physical Examination:  General: Awake, alert, obese, No acute distress HEENT: Normal, sclera white, MMM Cardio: regular rate and rhythm, S1S2 heard, no murmurs appreciated Pulm: clear to auscultation bilaterally, no wheezes, rhonchi or rales; normal work of breathing on room air Extremities: warm, well perfused, trace ankle edema L>R, NO cyanosis or clubbing; +2 pulses bilaterally MSK: slow wide based gait  Assessment/ Plan: 70 y.o. female   Morbid obesity (HCC)  Essential hypertension - Plan: hydrochlorothiazide (MICROZIDE) 12.5 MG capsule, CANCELED: Basic Metabolic Panel  Other hyperlipidemia - Plan: CANCELED: Lipid Panel  Pedal edema - Plan: hydrochlorothiazide (MICROZIDE) 12.5 MG capsule  Down 1 pound since her last visit but no great changes to diet.  She will be scheduling an appointment with healthy weight and wellness soon  Blood pressure is better after recheck  Plan for lipid panel at next visit, once she has started making dietary changes  For her pedal edema as needed hydrochlorothiazide 12.5 mg provided.  No orders of the defined types were placed in this encounter.  No orders of the defined types were placed in this encounter.    65, DO [facility] [phone]

## 2021-06-21 ENCOUNTER — Other Ambulatory Visit: Payer: Self-pay | Admitting: Family Medicine

## 2021-08-05 ENCOUNTER — Other Ambulatory Visit: Payer: Self-pay

## 2021-08-05 ENCOUNTER — Ambulatory Visit (INDEPENDENT_AMBULATORY_CARE_PROVIDER_SITE_OTHER): Payer: PPO | Admitting: Family Medicine

## 2021-08-05 ENCOUNTER — Encounter: Payer: Self-pay | Admitting: Family Medicine

## 2021-08-05 VITALS — BP 134/83 | HR 69 | Temp 97.8°F | Ht 67.0 in | Wt 235.6 lb

## 2021-08-05 DIAGNOSIS — E559 Vitamin D deficiency, unspecified: Secondary | ICD-10-CM | POA: Diagnosis not present

## 2021-08-05 DIAGNOSIS — I10 Essential (primary) hypertension: Secondary | ICD-10-CM

## 2021-08-05 DIAGNOSIS — E7849 Other hyperlipidemia: Secondary | ICD-10-CM | POA: Diagnosis not present

## 2021-08-05 MED ORDER — VITAMIN D (ERGOCALCIFEROL) 1.25 MG (50000 UNIT) PO CAPS: 50000.0000 [IU] | ORAL_CAPSULE | ORAL | 3 refills | Status: DC

## 2021-08-05 MED ORDER — LISINOPRIL 20 MG PO TABS
20.0000 mg | ORAL_TABLET | Freq: Every day | ORAL | 3 refills | Status: DC
Start: 2021-08-05 — End: 2022-02-03

## 2021-08-05 MED ORDER — SIMVASTATIN 20 MG PO TABS: ORAL_TABLET | ORAL | 3 refills | Status: DC

## 2021-08-05 MED ORDER — FLUTICASONE PROPIONATE 50 MCG/ACT NA SUSP
NASAL | 3 refills | Status: DC
Start: 2021-08-05 — End: 2022-08-07

## 2021-08-05 NOTE — Patient Instructions (Signed)
I am so PROUD of you.  You are really taking charge of your health.  You had labs performed today.  You will be contacted with the results of the labs once they are available, usually in the next 3 business days for routine lab work.  If you have an active my chart account, they will be released to your MyChart.  If you prefer to have these labs released to you via telephone, please let us know.

## 2021-08-05 NOTE — Progress Notes (Signed)
Subjective: CC: HTN, HLD PCP: Janora Norlander, DO Rhonda Daniel is a 70 y.o. female presenting to clinic today for:  1. HTN w/ HLD associated with morbid obesity Started on HCTZ 12.34m last visit for reports of pedal edema.  She was continued on lisinopril 20 mg and simvastatin 20 mg.  She is here for interval checkup today.  She was going to schedule an appointment with healthy weight and wellness to her last visit but [facility]  She instead started back on her weight watchers program independently and has done excellent with this.  She has a 40+ weight loss since September by simply modifying her diet.  She admits to feeling much better since some of the weight has come off.  She is compliant with her Zocor, lisinopril and does need refills on this.  She is discontinue the hydrochlorothiazide as she is having no more issues with lower extremity edema   ROS: Per HPI  Allergies  Allergen Reactions   Bee Venom     Local redness and swelling    Past Medical History:  Diagnosis Date   Anxiety    Hyperlipidemia    Hypertension    Sleep apnea    Was on CPAP.   Does not use now.     Current Outpatient Medications:    albuterol (VENTOLIN HFA) 108 (90 Base) MCG/ACT inhaler, Inhale 2 puffs into the lungs every 6 (six) hours as needed for wheezing or shortness of breath., Disp: 1 Inhaler, Rfl: 2   aspirin 81 MG tablet, Take 81 mg by mouth daily., Disp: , Rfl:    fluticasone (FLONASE) 50 MCG/ACT nasal spray, PLACE 2 SPRAYS INTO BOTH NOSTRILS EVERY DAY, Disp: 16 g, Rfl: 12   hydrochlorothiazide (MICROZIDE) 12.5 MG capsule, Take 1 capsule (12.5 mg total) by mouth daily as needed (swelling)., Disp: 90 capsule, Rfl: 1   lisinopril (ZESTRIL) 20 MG tablet, Take 1 tablet (20 mg total) by mouth daily., Disp: 90 tablet, Rfl: 3   simvastatin (ZOCOR) 20 MG tablet, TAKE 1 TABLET DAILY AT 6PM, Disp: 90 tablet, Rfl: 3   Vitamin D,  Ergocalciferol, (DRISDOL) 1.25 MG (50000 UNIT) CAPS capsule, Take 1 capsule (50,000 Units total) by mouth every 7 (seven) days., Disp: 12 capsule, Rfl: 3 Social History   Socioeconomic History   Marital status: Single    Spouse name: Not on file   Number of children: 0   Years of education: 121  Highest education level: Master's degree (e.g., MA, MS, MEng, MEd, MSW, MBA)  Occupational History   Occupation: Retired     Comment: SProofreader  Tobacco Use   Smoking status: Never   Smokeless tobacco: Never  Vaping Use   Vaping Use: Never used  Substance and Sexual Activity   Alcohol use: No   Drug use: No   Sexual activity: Not Currently    Birth control/protection: Surgical  Other Topics Concern   Not on file  Social History Narrative   Lives with patches (cat) with diabetes.    Social Determinants of Health   Financial Resource Strain: Low Risk    Difficulty of Paying Living Expenses: Not hard at all  Food Insecurity: No Food Insecurity   Worried About RCharity fundraiserin the Last Year: Never true   Ran Out of Food in the Last Year: Never true  Transportation Needs: No Transportation Needs   Lack of Transportation (  Medical): No   Lack of Transportation (Non-Medical): No  Physical Activity: Not on file  Stress: Not on file  Social Connections: Moderately Integrated   Frequency of Communication with Friends and Family: More than three times a week   Frequency of Social Gatherings with Friends and Family: More than three times a week   Attends Religious Services: More than 4 times per year   Active Member of [employer] or Organizations: Yes   Attends Music therapist: More than 4 times per year   Marital Status: Never married  Human resources officer Violence: Not on file   Family History  Problem Relation Age of Onset   Diabetes Mother    Hypertension Mother    Irregular heart beat Mother        pacemaker   Hearing loss Mother        right   Vision loss  Mother        macular degeneration   Rheumatic fever Father        heart murmur   Pneumonia Father    Heart disease Paternal Aunt    Cancer Paternal Aunt        tumor behind eye   Diabetes Maternal Grandmother    Vision loss Maternal Grandmother    Heart disease Paternal Grandfather    Cancer - Cervical Sister     Objective: Office vital signs reviewed. BP 134/83   Pulse 69   Temp 97.8 F (36.6 C)   Ht '5\' 7"'  (1.702 m)   Wt 235 lb 9.6 oz (106.9 kg)   SpO2 95%   BMI 36.90 kg/m   Physical Examination:  General: Awake, alert, obese but well-appearing, No acute distress HEENT: No exophthalmos.  No goiter Cardio: regular rate and rhythm, S1S2 heard, no murmurs appreciated Pulm: clear to auscultation bilaterally, no wheezes, rhonchi or rales; normal work of breathing on room air Extremities: warm, well perfused, No edema, cyanosis or clubbing; +2 pulses bilaterally MSK: Normal gait  Assessment/ Plan: 70 y.o. female   Essential hypertension - Plan: lisinopril (ZESTRIL) 20 MG tablet, CMP14+EGFR  Other hyperlipidemia - Plan: simvastatin (ZOCOR) 20 MG tablet, CMP14+EGFR, TSH, Lipid Panel  Morbid obesity (HCC)  Vitamin D deficiency - Plan: Vitamin D, Ergocalciferol, (DRISDOL) 1.25 MG (50000 UNIT) CAPS capsule, VITAMIN D 25 Hydroxy (Vit-D Deficiency, Fractures)  Blood pressure under good control.  Check CMP, TSH and lipid panel  Continue statin.  I would like to repeat this lipid panel again at her 81-monthfollow-up, particularly given ongoing lifestyle modification and weight loss  Has done an excellent job at reducing weight through lifestyle modification.  I congratulated her on this and hope that she will continue on her journey  Check vitamin D level.  Vitamin D has been renewed for this patient  Orders Placed This Encounter  Procedures   VITAMIN D 25 Hydroxy (Vit-D Deficiency, Fractures)   CMP14+EGFR   TSH   Lipid Panel   Meds ordered this encounter  Medications    fluticasone (FLONASE) 50 MCG/ACT nasal spray    Sig: PLACE 2 SPRAYS INTO BOTH NOSTRILS EVERY DAY    Dispense:  48 g    Refill:  3   lisinopril (ZESTRIL) 20 MG tablet    Sig: Take 1 tablet (20 mg total) by mouth daily.    Dispense:  90 tablet    Refill:  3   simvastatin (ZOCOR) 20 MG tablet    Sig: TAKE 1 TABLET DAILY AT 6PM    Dispense:  90 tablet    Refill:  3   Vitamin D, Ergocalciferol, (DRISDOL) 1.25 MG (50000 UNIT) CAPS capsule    Sig: Take 1 capsule (50,000 Units total) by mouth every 7 (seven) days.    Dispense:  12 capsule    Refill:  [place], [place] [phone]

## 2021-08-06 LAB — LIPID PANEL
Chol/HDL Ratio: 2.9 ratio (ref 0.0–4.4)
Cholesterol, Total: 151 mg/dL (ref 100–199)
HDL: 52 mg/dL (ref >39)
LDL Chol Calc (NIH): 87 mg/dL (ref 0–99)
Triglycerides: 60 mg/dL (ref 0–149)
VLDL Cholesterol Cal: 12 mg/dL (ref 5–40)

## 2021-08-06 LAB — CMP14+EGFR
ALT: 13 IU/L (ref 0–32)
AST: 21 IU/L (ref 0–40)
Albumin/Globulin Ratio: 2 (ref 1.2–2.2)
Albumin: 4.3 g/dL (ref 3.7–4.7)
Alkaline Phosphatase: 72 IU/L (ref 44–121)
BUN/Creatinine Ratio: 29 — ABNORMAL HIGH (ref 12–28)
BUN: 21 mg/dL (ref 8–27)
Bilirubin Total: 0.5 mg/dL (ref 0.0–1.2)
CO2: 24 mmol/L (ref 20–29)
Calcium: 9.6 mg/dL (ref 8.7–10.3)
Chloride: 106 mmol/L (ref 96–106)
Creatinine, Ser: 0.72 mg/dL (ref 0.57–1.00)
Globulin, Total: 2.2 g/dL (ref 1.5–4.5)
Glucose: 82 mg/dL (ref 70–99)
Potassium: 4.4 mmol/L (ref 3.5–5.2)
Sodium: 146 mmol/L — ABNORMAL HIGH (ref 134–144)
Total Protein: 6.5 g/dL (ref 6.0–8.5)
eGFR: 89 mL/min/{1.73_m2} (ref >59)

## 2021-08-06 LAB — VITAMIN D 25 HYDROXY (VIT D DEFICIENCY, FRACTURES): Vit D, 25-Hydroxy: 67.8 ng/mL (ref 30.0–100.0)

## 2021-08-06 LAB — TSH: TSH: 1.28 u[IU]/mL (ref 0.450–4.500)

## 2021-09-05 ENCOUNTER — Other Ambulatory Visit: Payer: Self-pay | Admitting: Family Medicine

## 2021-09-05 DIAGNOSIS — Z1231 Encounter for screening mammogram for malignant neoplasm of breast: Secondary | ICD-10-CM

## 2021-09-22 ENCOUNTER — Ambulatory Visit (INDEPENDENT_AMBULATORY_CARE_PROVIDER_SITE_OTHER): Payer: No Typology Code available for payment source

## 2021-09-22 VITALS — Ht 67.0 in | Wt 235.0 lb

## 2021-09-22 DIAGNOSIS — Z0000 Encounter for general adult medical examination without abnormal findings: Secondary | ICD-10-CM | POA: Diagnosis not present

## 2021-09-22 NOTE — Patient Instructions (Signed)
Rhonda Daniel , Thank you for taking time to come for your Medicare Wellness Visit. I appreciate your ongoing commitment to your health goals. Please review the following plan we discussed and let me know if I can assist you in the future.   Screening recommendations/referrals: Colonoscopy: Done 11/21/2010 - Repeat in 10 years *due - discuss with Dr Nadine Counts Mammogram: Done 10/30/2020 - Repeat annually *appointment 11/12/21 Bone Density: done 01/13/2018 - Repeat in 5 years  Recommended yearly ophthalmology/optometry visit for glaucoma screening and checkup Recommended yearly dental visit for hygiene and checkup  Vaccinations: Influenza vaccine: Due Pneumococcal vaccine: Due Tdap vaccine: Done 05/02/2009 - Repeat in 10 years *due Shingles vaccine: Due   Covid-19:Done 10/12/19, 11/09/19, 07/30/20, 05/06/2021  Advanced directives: Please bring a copy of your health care power of attorney and living will to the office to be added to your chart at your convenience.   Conditions/risks identified: Aim for 30 minutes of exercise or brisk walking each day, drink 6-8 glasses of water and eat lots of fruits and vegetables. Keep up the great work!   Next appointment: Follow up in one year for your annual wellness visit    Preventive Care 65 Years and Older, Female Preventive care refers to lifestyle choices and visits with your health care provider that can promote health and wellness. What does preventive care include? A yearly physical exam. This is also called an annual well check. Dental exams once or twice a year. Routine eye exams. Ask your health care provider how often you should have your eyes checked. Personal lifestyle choices, including: Daily care of your teeth and gums. Regular physical activity. Eating a healthy diet. Avoiding tobacco and drug use. Limiting alcohol use. Practicing safe sex. Taking low-dose aspirin every day. Taking vitamin and mineral supplements as recommended by your  health care provider. What happens during an annual well check? The services and screenings done by your health care provider during your annual well check will depend on your age, overall health, lifestyle risk factors, and family history of disease. Counseling  Your health care provider may ask you questions about your: Alcohol use. Tobacco use. Drug use. Emotional well-being. Home and relationship well-being. Sexual activity. Eating habits. History of falls. Memory and ability to understand (cognition). Work and work Astronomer. Reproductive health. Screening  You may have the following tests or measurements: Height, weight, and BMI. Blood pressure. Lipid and cholesterol levels. These may be checked every 5 years, or more frequently if you are over 25 years old. Skin check. Lung cancer screening. You may have this screening every year starting at age 70 if you have a 30-pack-year history of smoking and currently smoke or have quit within the past 15 years. Fecal occult blood test (FOBT) of the stool. You may have this test every year starting at age 50. Flexible sigmoidoscopy or colonoscopy. You may have a sigmoidoscopy every 5 years or a colonoscopy every 10 years starting at age 6. Hepatitis C blood test. Hepatitis B blood test. Sexually transmitted disease (STD) testing. Diabetes screening. This is done by checking your blood sugar (glucose) after you have not eaten for a while (fasting). You may have this done every 1-3 years. Bone density scan. This is done to screen for osteoporosis. You may have this done starting at age 33. Mammogram. This may be done every 1-2 years. Talk to your health care provider about how often you should have regular mammograms. Talk with your health care provider about your test results,  treatment options, and if necessary, the need for more tests. Vaccines  Your health care provider may recommend certain vaccines, such as: Influenza vaccine.  This is recommended every year. Tetanus, diphtheria, and acellular pertussis (Tdap, Td) vaccine. You may need a Td booster every 10 years. Zoster vaccine. You may need this after age 72. Pneumococcal 13-valent conjugate (PCV13) vaccine. One dose is recommended after age 19. Pneumococcal polysaccharide (PPSV23) vaccine. One dose is recommended after age 41. Talk to your health care provider about which screenings and vaccines you need and how often you need them. This information is not intended to replace advice given to you by your health care provider. Make sure you discuss any questions you have with your health care provider. Document Released: 09/20/2015 Document Revised: 05/13/2016 Document Reviewed: 06/25/2015 Elsevier Interactive Patient Education  2017 [place] Prevention in the Home Falls can cause injuries. They can happen to people of all ages. There are many things you can do to make your home safe and to help prevent falls. What can I do on the outside of my home? Regularly fix the edges of walkways and driveways and fix any cracks. Remove anything that might make you trip as you walk through a door, such as a raised step or threshold. Trim any bushes or trees on the path to your home. Use bright outdoor lighting. Clear any walking paths of anything that might make someone trip, such as rocks or tools. Regularly check to see if handrails are loose or broken. Make sure that both sides of any steps have handrails. Any raised decks and porches should have guardrails on the edges. Have any leaves, snow, or ice cleared regularly. Use sand or salt on walking paths during winter. Clean up any spills in your garage right away. This includes oil or grease spills. What can I do in the bathroom? Use night lights. Install grab bars by the toilet and in the tub and shower. Do not use towel bars as grab bars. Use non-skid mats or decals in the tub or shower. If you need to sit  down in the shower, use a plastic, non-slip stool. Keep the floor dry. Clean up any water that spills on the floor as soon as it happens. Remove soap buildup in the tub or shower regularly. Attach bath mats securely with double-sided non-slip rug tape. Do not have throw rugs and other things on the floor that can make you trip. What can I do in the bedroom? Use night lights. Make sure that you have a light by your bed that is easy to reach. Do not use any sheets or blankets that are too big for your bed. They should not hang down onto the floor. Have a firm chair that has side arms. You can use this for support while you get dressed. Do not have throw rugs and other things on the floor that can make you trip. What can I do in the kitchen? Clean up any spills right away. Avoid walking on wet floors. Keep items that you use a lot in easy-to-reach places. If you need to reach something above you, use a strong step stool that has a grab bar. Keep electrical cords out of the way. Do not use floor polish or wax that makes floors slippery. If you must use wax, use non-skid floor wax. Do not have throw rugs and other things on the floor that can make you trip. What can I do with my stairs? Do  not leave any items on the stairs. Make sure that there are handrails on both sides of the stairs and use them. Fix handrails that are broken or loose. Make sure that handrails are as long as the stairways. Check any carpeting to make sure that it is firmly attached to the stairs. Fix any carpet that is loose or worn. Avoid having throw rugs at the top or bottom of the stairs. If you do have throw rugs, attach them to the floor with carpet tape. Make sure that you have a light switch at the top of the stairs and the bottom of the stairs. If you do not have them, ask someone to add them for you. What else can I do to help prevent falls? Wear shoes that: Do not have high heels. Have rubber bottoms. Are  comfortable and fit you well. Are closed at the toe. Do not wear sandals. If you use a stepladder: Make sure that it is fully opened. Do not climb a closed stepladder. Make sure that both sides of the stepladder are locked into place. Ask someone to hold it for you, if possible. Clearly mark and make sure that you can see: Any grab bars or handrails. First and last steps. Where the edge of each step is. Use tools that help you move around (mobility aids) if they are needed. These include: Canes. Walkers. Scooters. Crutches. Turn on the lights when you go into a dark area. Replace any light bulbs as soon as they burn out. Set up your furniture so you have a clear path. Avoid moving your furniture around. If any of your floors are uneven, fix them. If there are any pets around you, be aware of where they are. Review your medicines with your doctor. Some medicines can make you feel dizzy. This can increase your chance of falling. Ask your doctor what other things that you can do to help prevent falls. This information is not intended to replace advice given to you by your health care provider. Make sure you discuss any questions you have with your health care provider. Document Released: 06/20/2009 Document Revised: 01/30/2016 Document Reviewed: 09/28/2014 Elsevier Interactive Patient Education  2017 [employer].

## 2021-09-22 NOTE — Progress Notes (Signed)
Subjective:   Rhonda Daniel is a 71 y.o. female who presents for Medicare Annual (Subsequent) preventive examination.  Virtual Visit via Telephone Note  I connected with  Rhonda Daniel on 09/22/21 at  2:00 PM EST by telephone and verified that I am speaking with the correct person using two identifiers.  Location: Patient: home Provider: [facility] Persons participating in the virtual visit: patient/Nurse Health Advisor   I discussed the limitations, risks, security and privacy concerns of performing an evaluation and management service by telephone and the availability of in person appointments. The patient expressed understanding and agreed to proceed.  Interactive audio and video telecommunications were attempted between this nurse and patient, however failed, due to patient having technical difficulties OR patient did not have access to video capability.  We continued and completed visit with audio only.  Some vital signs may be absent or patient reported.   Bing Duffey E Stoney Karczewski, LPN   Review of Systems     Cardiac Risk Factors include: advanced age (>3955men, 4>65 women);obesity (BMI >30kg/m2);dyslipidemia;hypertension;Other (see comment), Risk factor comments: OSA not on CPAP     Objective:    Today's Vitals   09/22/21 1612  Weight: 235 lb (106.6 kg)  Height: 5\' 7"  (1.702 m)   Body mass index is 36.81 kg/m.  Advanced Directives 09/22/2021 09/19/2020 09/19/2019 09/15/2018 12/29/2016  Does Patient Have a Medical Advance Directive? Yes Yes No Yes No  Type of Estate agentAdvance Directive Healthcare Power of ArgyleAttorney;Living will Living will - Living will -  Does patient want to make changes to medical advance directive? - No - Patient declined - No - Patient declined -  Copy of Healthcare Power of Attorney in Chart? No - copy requested - - - -  Would patient like information on creating a medical advance directive? - - No - Patient declined - Yes (MAU/Ambulatory/Procedural Areas - Information given)     Current Medications (verified) Outpatient Encounter Medications as of 09/22/2021  Medication Sig   aspirin 81 MG tablet Take 81 mg by mouth daily.   fluticasone (FLONASE) 50 MCG/ACT nasal spray PLACE 2 SPRAYS INTO BOTH NOSTRILS EVERY DAY   lisinopril (ZESTRIL) 20 MG tablet Take 1 tablet (20 mg total) by mouth daily.   simvastatin (ZOCOR) 20 MG tablet TAKE 1 TABLET DAILY AT 6PM   Vitamin D, Ergocalciferol, (DRISDOL) 1.25 MG (50000 UNIT) CAPS capsule Take 1 capsule (50,000 Units total) by mouth every 7 (seven) days.   No facility-administered encounter medications on file as of 09/22/2021.    Allergies (verified) Bee venom   History: Past Medical History:  Diagnosis Date   Anxiety    Hyperlipidemia    Hypertension    Sleep apnea    Was on CPAP.   Does not use now.    Past Surgical History:  Procedure Laterality Date   ABDOMINAL HYSTERECTOMY     APPENDECTOMY     with malignancy of appendix   CHOLECYSTECTOMY     COLON SURGERY     preventative - with appendix malignancy   HERNIA REPAIR     Family History  Problem Relation Age of Onset   Diabetes Mother    Hypertension Mother    Irregular heart beat Mother        pacemaker   Hearing loss Mother        right   Vision loss Mother        macular degeneration   Rheumatic fever Father  heart murmur   Pneumonia Father    Heart disease Paternal Aunt    Cancer Paternal Aunt        tumor behind eye   Diabetes Maternal Grandmother    Vision loss Maternal Grandmother    Heart disease Paternal Grandfather    Cancer - Cervical Sister    Social History   Socioeconomic History   Marital status: Single    Spouse name: Not on file   Number of children: 0   Years of education: 16   Highest education level: Master's degree (e.g., MA, MS, MEng, MEd, MSW, MBA)  Occupational History   Occupation: Retired     Comment: Advice worker   Tobacco Use   Smoking status: Never   Smokeless tobacco: Never  Haematologist Use: Never used  Substance and Sexual Activity   Alcohol use: No   Drug use: No   Sexual activity: Not Currently    Birth control/protection: Surgical  Other Topics Concern   Not on file  Social History Narrative   Lives with patches (cat) with diabetes.    Took care of her mother until 01-17-2018 when she passed away, her father passed away when she was about 54 - so she retired early to care for him   Her brother and 2 nieces live close and visit frequently   Social Determinants of Corporate investment banker Strain: Low Risk    Difficulty of Paying Living Expenses: Not hard at all  Food Insecurity: No Food Insecurity   Worried About Programme researcher, broadcasting/film/video in the Last Year: Never true   Barista in the Last Year: Never true  Transportation Needs: No Transportation Needs   Lack of Transportation (Medical): No   Lack of Transportation (Non-Medical): No  Physical Activity: Insufficiently Active   Days of Exercise per Week: 7 days   Minutes of Exercise per Session: 20 min  Stress: No Stress Concern Present   Feeling of Stress : Not at all  Social Connections: Moderately Integrated   Frequency of Communication with Friends and Family: Not on file   Frequency of Social Gatherings with Friends and Family: More than three times a week   Attends Religious Services: More than 4 times per year   Active Member of [employer] or Organizations: Yes   Attends Engineer, structural: More than 4 times per year   Marital Status: Never married    Tobacco Counseling Counseling given: Not Answered   Clinical Intake:  Pre-visit preparation completed: Yes  Pain : No/denies pain     BMI - recorded: 36.81 Nutritional Status: BMI > 30  Obese Nutritional Risks: None Diabetes: No  How often do you need to have someone help you when you read instructions, pamphlets, or other written materials from your doctor or pharmacy?: 1 - Never  Diabetic? no  Interpreter Needed?:  No  Information entered by :: Jakyiah Briones, LPN   Activities of Daily Living In your present state of health, do you have any difficulty performing the following activities: 09/22/2021  Hearing? N  Vision? N  Difficulty concentrating or making decisions? N  Walking or climbing stairs? N  Dressing or bathing? N  Doing errands, shopping? N  Preparing Food and eating ? N  Using the Toilet? N  In the past six months, have you accidently leaked urine? N  Do you have problems with loss of bowel control? N  Managing your Medications? N  Managing  your Finances? N  Housekeeping or managing your Housekeeping? N  Some recent data might be hidden    Patient Care Team: Raliegh IpGottschalk, Ashly M, DO as PCP - General (Family Medicine) Derryl Harborran, Truc Ly, OD (Optometry) Rollene RotundaHochrein, James, MD as Consulting Physician (Cardiology) Ovidio KinNewman, David, MD as Consulting Physician (General Surgery)  Indicate any recent Medical Services you may have received from other than [facility] providers in the past year (date may be approximate).     Assessment:   This is a routine wellness examination for Rhonda Daniel.  Hearing/Vision screen Hearing Screening - Comments:: Denies hearing difficulties  Vision Screening - Comments:: Wears rx glasses- up to date with annual eye exams with [facility]  Dietary issues and exercise activities discussed: Current Exercise Habits: Home exercise routine, Type of exercise: walking, Time (Minutes): 30, Frequency (Times/Week): 7, Weekly Exercise (Minutes/Week): 210, Intensity: Mild, Exercise limited by: Other - see comments   Goals Addressed             This Visit's Progress    DIET - INCREASE WATER INTAKE   On track    Try to drink 6-8 glasses of water daily     Exercise 3x per week (30 min per time)   On track    Become more active - work on  building upper body strength.  [employer] is a great option.        Depression Screen PHQ 2/9 Scores 09/22/2021 08/05/2021 11/13/2020  10/01/2020 09/19/2020 07/16/2020 11/20/2019  PHQ - 2 Score 1 1 0 0 0 0 0  PHQ- 9 Score 2 3 0 - - - -    Fall Risk Fall Risk  09/22/2021 08/05/2021 11/13/2020 10/01/2020 09/19/2020  Falls in the past year? 0 0 0 0 1  Number falls in past yr: 0 - - - 1  Injury with Fall? 0 - - - 0  Risk for fall due to : No Fall Risks - - - History of fall(s);Impaired mobility  Follow up Falls prevention discussed - - - Falls evaluation completed    FALL RISK PREVENTION PERTAINING TO THE HOME:  Any stairs in or around the home? No  If so, are there any without handrails? No  Home free of loose throw rugs in walkways, pet beds, electrical cords, etc? Yes  Adequate lighting in your home to reduce risk of falls? Yes   ASSISTIVE DEVICES UTILIZED TO PREVENT FALLS:  Life alert? No  Use of a cane, walker or w/c?  She has a cane to use when she has to go long distances only Grab bars in the bathroom? Yes  Shower chair or bench in shower? No  Elevated toilet seat or a handicapped toilet? Yes   TIMED UP AND GO:  Was the test performed? No .  Telephonic visit  Cognitive Function:Normal cognitive status assessed by direct observation by this Nurse Health Advisor. No abnormalities found.    MMSE - Mini Mental State Exam 09/15/2018 12/29/2016  Orientation to time 5 5  Orientation to Place 5 5  Registration 3 3  Attention/ Calculation 5 5  Recall 3 3  Language- name 2 objects 2 2  Language- repeat 1 1  Language- follow 3 step command 3 3  Language- read & follow direction 1 1  Write a sentence 1 1  Copy design 1 1  Total score 30 30     6CIT Screen 09/19/2020 09/19/2019  What Year? 0 points 0 points  What month? 0 points 0 points  What  time? 0 points 0 points  Count back from 20 0 points 0 points  Months in reverse 0 points 0 points  Repeat phrase 0 points 0 points  Total Score 0 0    Immunizations Immunization History  Administered Date(s) Administered   Moderna Sars-Covid-2 Vaccination 10/12/2019,  11/09/2019, 07/30/2020, 05/06/2021   Tdap 05/02/2009    TDAP status: Due, Education has been provided regarding the importance of this vaccine. Advised may receive this vaccine at local pharmacy or Health Dept. Aware to provide a copy of the vaccination record if obtained from local pharmacy or Health Dept. Verbalized acceptance and understanding.  Flu Vaccine status: Declined, Education has been provided regarding the importance of this vaccine but patient still declined. Advised may receive this vaccine at local pharmacy or Health Dept. Aware to provide a copy of the vaccination record if obtained from local pharmacy or Health Dept. Verbalized acceptance and understanding.  Pneumococcal vaccine status: Due, Education has been provided regarding the importance of this vaccine. Advised may receive this vaccine at local pharmacy or Health Dept. Aware to provide a copy of the vaccination record if obtained from local pharmacy or Health Dept. Verbalized acceptance and understanding.  Covid-19 vaccine status: Completed vaccines  Qualifies for Shingles Vaccine? Yes   Zostavax completed No   Shingrix Completed?: No.    Education has been provided regarding the importance of this vaccine. Patient has been advised to call insurance company to determine out of pocket expense if they have not yet received this vaccine. Advised may also receive vaccine at local pharmacy or Health Dept. Verbalized acceptance and understanding.  Screening Tests Health Maintenance  Topic Date Due   COVID-19 Vaccine (5 - Booster for Moderna series) 07/01/2021   Hepatitis C Screening  10/01/2021 (Originally 06/17/1969)   Zoster Vaccines- Shingrix (1 of 2) 11/04/2021 (Originally 06/17/1970)   INFLUENZA VACCINE  12/05/2021 (Originally 04/07/2021)   Pneumonia Vaccine 81+ Years old (1 - PCV) 08/05/2022 (Originally 06/17/1957)   COLONOSCOPY (Pts 45-4yrs Insurance coverage will need to be confirmed)  08/05/2022 (Originally  11/21/2018)   TETANUS/TDAP  08/05/2022 (Originally 05/03/2019)   MAMMOGRAM  10/30/2021   DEXA SCAN  01/14/2023   HPV VACCINES  Aged Out    Health Maintenance  Health Maintenance Due  Topic Date Due   COVID-19 Vaccine (5 - Booster for Moderna series) 07/01/2021    Colorectal cancer screening: Due - to discuss with PCP  Mammogram status: Completed 10/30/2020. Repeat every year has appt 11/12/21  Bone Density status: Completed 01/13/2018. Results reflect: Bone density results: NORMAL. Repeat every 5 years.  Lung Cancer Screening: (Low Dose CT Chest recommended if Age 69-80 years, 30 pack-year currently smoking OR have quit w/in 15years.) does not qualify.   Additional Screening:  Hepatitis C Screening: does qualify; DUE  Vision Screening: Recommended annual ophthalmology exams for early detection of glaucoma and other disorders of the eye. Is the patient up to date with their annual eye exam?  Yes  Who is the provider or what is the name of the office in which the patient attends annual eye exams? [facility] If pt is not established with a provider, would they like to be referred to a provider to establish care? No .   Dental Screening: Recommended annual dental exams for proper oral hygiene  Community Resource Referral / Chronic Care Management: CRR required this visit?  No   CCM required this visit?  No      Plan:     I have  personally reviewed and noted the following in the patients chart:   Medical and social history Use of alcohol, tobacco or illicit drugs  Current medications and supplements including opioid prescriptions.  Functional ability and status Nutritional status Physical activity Advanced directives List of other physicians Hospitalizations, surgeries, and ER visits in previous 12 months Vitals Screenings to include cognitive, depression, and falls Referrals and appointments  In addition, I have reviewed and discussed with patient certain  preventive protocols, quality metrics, and best practice recommendations. A written personalized care plan for preventive services as well as general preventive health recommendations were provided to patient.     [place] Constable, LPN   5/32/9924   Nurse Notes: None

## 2021-11-12 ENCOUNTER — Other Ambulatory Visit: Payer: Self-pay

## 2021-11-12 ENCOUNTER — Ambulatory Visit
Admission: RE | Admit: 2021-11-12 | Discharge: 2021-11-12 | Disposition: A | Discharge status: Home / Self Care | Payer: No Typology Code available for payment source | Source: Ambulatory Visit | Attending: Family Medicine | Admitting: Family Medicine

## 2021-11-12 DIAGNOSIS — Z1231 Encounter for screening mammogram for malignant neoplasm of breast: Secondary | ICD-10-CM

## 2022-02-03 ENCOUNTER — Encounter: Payer: Self-pay | Admitting: Family Medicine

## 2022-02-03 ENCOUNTER — Ambulatory Visit (INDEPENDENT_AMBULATORY_CARE_PROVIDER_SITE_OTHER): Payer: No Typology Code available for payment source | Admitting: Family Medicine

## 2022-02-03 VITALS — BP 137/74 | HR 75 | Temp 98.1°F | Ht 67.0 in | Wt 222.2 lb

## 2022-02-03 DIAGNOSIS — I10 Essential (primary) hypertension: Secondary | ICD-10-CM

## 2022-02-03 DIAGNOSIS — Z8509 Personal history of malignant neoplasm of other digestive organs: Secondary | ICD-10-CM

## 2022-02-03 DIAGNOSIS — H01005 Unspecified blepharitis left lower eyelid: Secondary | ICD-10-CM | POA: Diagnosis not present

## 2022-02-03 DIAGNOSIS — Z0000 Encounter for general adult medical examination without abnormal findings: Secondary | ICD-10-CM

## 2022-02-03 DIAGNOSIS — L918 Other hypertrophic disorders of the skin: Secondary | ICD-10-CM | POA: Diagnosis not present

## 2022-02-03 DIAGNOSIS — E7849 Other hyperlipidemia: Secondary | ICD-10-CM | POA: Diagnosis not present

## 2022-02-03 DIAGNOSIS — Z1211 Encounter for screening for malignant neoplasm of colon: Secondary | ICD-10-CM | POA: Diagnosis not present

## 2022-02-03 LAB — BAYER DCA HB A1C WAIVED: HB A1C (BAYER DCA - WAIVED): 4.9 % (ref 4.8–5.6)

## 2022-02-03 MED ORDER — LISINOPRIL 20 MG PO TABS: 20.0000 mg | ORAL_TABLET | Freq: Every day | ORAL | 3 refills | Status: DC

## 2022-02-03 MED ORDER — ERYTHROMYCIN 5 MG/GM OP OINT: 1.0000 "application " | TOPICAL_OINTMENT | Freq: Every day | OPHTHALMIC | 0 refills | Status: DC

## 2022-02-03 MED ORDER — SIMVASTATIN 20 MG PO TABS: ORAL_TABLET | ORAL | 3 refills | Status: DC

## 2022-02-03 NOTE — Addendum Note (Signed)
Addended by: Janora Norlander on: 02/03/2022 01:17 PM   Modules accepted: Orders

## 2022-02-03 NOTE — Progress Notes (Addendum)
Rhonda Daniel is a 71 y.o. female presents to office today for annual physical exam examination.    Concerns today include: 1.  Neck lesion Patient reports that she has been having a lesion on the right side of her neck that has been getting caught on her blouses becoming irritated.  She would like to have this removed if possible  2.  Weight loss She continues to work on weight loss with weight watchers.  She has been very successful and lost even another 10 pounds since her last visit.  She is down about 60 pounds and feels the best that she has in a while.  She reports increased energy and mood.  She does report some constipation but admits that she may not be drinking sufficient water.  She feels that prune juice does help move her bowels but if she does not drink it she does not move her bowels  3.  Eye irritation Patient reports some eye irritation along the lower lid on the left.  Reports some mild crusting but no pain.  Occupation: Retired DietParamedic.  High in fiber and lower carb Last eye exam: Up-to-date.  Had new glasses Last dental exam: Up-to-date Last colonoscopy: needs.  Used to see somebody in [place] but is not sure who it was.  Last colonoscopy was greater than 10 years ago.  She apparently has some type of history of an appendiceal mass that was resected along with part of her colon many years ago Last mammogram: Up-to-date Last pap smear: N/A Refills needed today: None Immunizations needed: declines tetanus, pna and shingles vaccine.  Will return on Monday for pneumonia shot Immunization History  Administered Date(s) Administered   Moderna Sars-Covid-2 Vaccination 10/12/2019, 11/09/2019, 07/30/2020, 05/06/2021   Tdap 05/02/2009     Past Medical History:  Diagnosis Date   Anxiety    Hyperlipidemia    Hypertension    Sleep apnea    Was on CPAP.   Does not use now.    Social History   Socioeconomic History   Marital status: Single    Spouse name: Not  on file   Number of children: 0   Years of education: 16   Highest education level: Master's degree (e.g., MA, MS, MEng, MEd, MSW, MBA)  Occupational History   Occupation: Retired     Comment: Proofreader   Tobacco Use   Smoking status: Never   Smokeless tobacco: Never  Vaping Use   Vaping Use: Never used  Substance and Sexual Activity   Alcohol use: No   Drug use: No   Sexual activity: Not Currently    Birth control/protection: Surgical  Other Topics Concern   Not on file  Social History Narrative   Lives with patches (cat) with diabetes.    Took care of her mother until 11/10/2017 when she passed away, her father passed away when she was about 31 - so she retired early to care for him   Her brother and 2 nieces live close and visit frequently   Social Determinants of Radio broadcast assistant Strain: Low Risk    Difficulty of Paying Living Expenses: Not hard at all  Food Insecurity: No Food Insecurity   Worried About Charity fundraiser in the Last Year: Never true   Arboriculturist in the Last Year: Never true  Transportation Needs: No Transportation Needs   Lack of Transportation (Medical): No   Lack of Transportation (Non-Medical): No  Physical Activity:  Insufficiently Active   Days of Exercise per Week: 7 days   Minutes of Exercise per Session: 20 min  Stress: No Stress Concern Present   Feeling of Stress : Not at all  Social Connections: Moderately Integrated   Frequency of Communication with Friends and Family: Not on file   Frequency of Social Gatherings with Friends and Family: More than three times a week   Attends Religious Services: More than 4 times per year   Active Member of [employer] or Organizations: Yes   Attends Music therapist: More than 4 times per year   Marital Status: Never married  Human resources officer Violence: Not At Risk   Fear of Current or Ex-Partner: No   Emotionally Abused: No   Physically Abused: No   Sexually Abused: No    Past Surgical History:  Procedure Laterality Date   ABDOMINAL HYSTERECTOMY     APPENDECTOMY     with malignancy of appendix   CHOLECYSTECTOMY     COLON SURGERY     preventative - with appendix malignancy   HERNIA REPAIR     Family History  Problem Relation Age of Onset   Diabetes Mother    Hypertension Mother    Irregular heart beat Mother        pacemaker   Hearing loss Mother        right   Vision loss Mother        macular degeneration   Rheumatic fever Father        heart murmur   Pneumonia Father    Cancer - Cervical Sister    Heart disease Paternal Aunt    Cancer Paternal Aunt        tumor behind eye   Diabetes Maternal Grandmother    Vision loss Maternal Grandmother    Heart disease Paternal Grandfather    Breast cancer Neg Hx     Current Outpatient Medications:    aspirin 81 MG tablet, Take 81 mg by mouth daily., Disp: , Rfl:    fluticasone (FLONASE) 50 MCG/ACT nasal spray, PLACE 2 SPRAYS INTO BOTH NOSTRILS EVERY DAY, Disp: 48 g, Rfl: 3   lisinopril (ZESTRIL) 20 MG tablet, Take 1 tablet (20 mg total) by mouth daily., Disp: 90 tablet, Rfl: 3   simvastatin (ZOCOR) 20 MG tablet, TAKE 1 TABLET DAILY AT 6PM, Disp: 90 tablet, Rfl: 3   Vitamin D, Ergocalciferol, (DRISDOL) 1.25 MG (50000 UNIT) CAPS capsule, Take 1 capsule (50,000 Units total) by mouth every 7 (seven) days., Disp: 12 capsule, Rfl: 3  Allergies  Allergen Reactions   Bee Venom     Local redness and swelling      ROS: Review of Systems Pertinent items noted in HPI and remainder of comprehensive ROS otherwise negative.    Physical exam BP 137/74   Pulse 75   Temp 98.1 F (36.7 C)   Ht '5\' 7"'  (1.702 m)   Wt 222 lb 3.2 oz (100.8 kg)   SpO2 97%   BMI 34.80 kg/m  General appearance: alert, cooperative, appears stated age, and moderately obese Head: Normocephalic, without obvious abnormality, atraumatic Eyes: negative findings: Left lower lid with some mild inflammation and hyperemia noted.   Scant crusty substance on the lash.  Normal conjunctivae and sclerae normal Ears: normal TM's and external ear canals both ears Nose: Nares normal. Septum midline. Mucosa normal. No drainage or sinus tenderness. Throat: lips, mucosa, and tongue normal; teeth and gums normal Neck: no adenopathy, supple, symmetrical, trachea midline, and  thyroid not enlarged, symmetric, no tenderness/mass/nodules Back: symmetric, no curvature. ROM normal. No CVA tenderness. Lungs: clear to auscultation bilaterally Heart: regular rate and rhythm, S1, S2 normal, no murmur, click, rub or gallop Abdomen: soft, non-tender; bowel sounds normal; no masses,  no organomegaly Extremities: extremities normal, atraumatic, no cyanosis or edema Pulses: 2+ and symmetric Skin:  Partially avulsed skin tag noted along the right anterior neck.  No active bleeding.  Multiple pigmented nevi noted along the trunk, neck and upper extremities Lymph nodes: Cervical, supraclavicular, and axillary nodes normal. Neurologic: Grossly normal Psych: Mood stable, speech normal, affect appropriate  Flowsheet Row Clinical Support from 09/22/2021 in [place]  PHQ-2 Total Score 1      PROCEDURE NOTE: Skin tag removal Patient given informed consent, signed copy in the chart.  Appropriate time out taken. Areas of concern cleansed with alcohol swabs.  Anesthesia of the lesion was achieved by use of ethyl chloride spray.  Once anaesthesia obtained, skin tag along the right anterior neck were removed using sterile scissors.  1 skin tags were removed in total.  The patient tolerated the procedure well.  Minimal bleeding.  Patient given post procedure instructions.   Assessment/ Plan: Kristen Cardinal Vandalen here for annual physical exam.   Annual physical exam  Morbid obesity ([place]) - Plan: Lipid Panel, Bayer DCA Hb A1c Waived, CMP14+EGFR  Other hyperlipidemia - Plan: Lipid Panel, CMP14+EGFR, simvastatin (ZOCOR) 20 MG  tablet  Essential hypertension - Plan: CMP14+EGFR, lisinopril (ZESTRIL) 20 MG tablet  Inflamed skin tag  Colon cancer screening - Plan: Ambulatory referral to Gastroenterology  History of malignant neoplasm of appendix - Plan: Ambulatory referral to Gastroenterology  Blepharitis of left lower eyelid, unspecified type - Plan: erythromycin ophthalmic ointment  Needs colonoscopy and I referred her to gastroenterology.  She would like to establish care with Dr. Hilarie Fredrickson as she believes that her previous gastroenterologist has since retired.  Unfortunately, I was unable to locate her previous colonoscopy results as this seems to predate the transition of this clinic to EMR so I am unsure as to what this neoplasm of the appendix was in whom resected it.  Fasting labs have been collected today.  A1c did not demonstrate any prediabetes or diabetes and she is doing an excellent job with lifestyle modification.  Her repeat blood pressure was appropriate.  No changes  Skin tag was removed.  See above procedure note.  No immediate complications.    Lower lid concern is consistent with blepharitis.  Erythromycin ointment sent  Follow-up as needed or in 6 months  Makyiah Lie M. Lajuana Ripple, DO

## 2022-02-03 NOTE — Patient Instructions (Signed)
Skin Tag, Adult  A skin tag (acrochordon) is a soft, extra growth of skin. Most skin tags are skin-colored and rarely bigger than a pencil eraser. They commonly form in areas where there is frequent rubbing, or friction, on the skin. This may be where there are folds in the skin, such as the eyelids, neck, armpit, or groin. Skin tags are not dangerous, and they do not spread from person to person (are not contagious). You may have one skin tag or several. Skin tags do not require treatment. However, your health care provider may recommend removal of a skin tag if it: Gets irritated from clothing or jewelry. Bleeds. Is visible and unsightly. What are the causes? This condition is linked with: Increasing age. Pregnancy. Diabetes. Obesity. What are the signs or symptoms? Skin tags usually do not cause symptoms unless they get irritated by items touching your skin, such as clothing or jewelry. When this happens, you may have pain, itching, or bleeding. How is this diagnosed? This condition is diagnosed with an evaluation from your health care provider. No testing is needed for diagnosis. How is this treated? Treatment for this condition depends on whether you have symptoms. If a skin tag needs to be removed, your health care provider can remove it with: A simple surgical procedure using scissors. A procedure that involves freezing your skin tag with a gas in liquid form (liquid nitrogen). A procedure that uses heat to destroy your skin tag (electrodessication). Your health care provider may also remove your skin tag if it is visible or unsightly, Follow these instructions at home: Watch for any changes in your skin tag. A normal skin tag does not require any other special care at home. Take over-the-counter and prescription medicines only as told by your health care provider. Keep all follow-up visits as told by your health care provider. This is important. Contact a health care provider  if: You have a skin tag that: Becomes painful. Changes color. Bleeds. Swells. Summary Skin tags are soft, extra growths of skin found in areas of frequent rubbing or friction. Skin tags usually do not cause symptoms. If symptoms occur, you may have pain, itching, or bleeding. If your skin tag causes symptoms or is unsightly, your health care provider can remove it. This information is not intended to replace advice given to you by your health care provider. Make sure you discuss any questions you have with your health care provider. Document Revised: 06/26/2019 Document Reviewed: 06/26/2019 Elsevier Patient Education  2022 Elsevier Inc.  

## 2022-02-04 LAB — CMP14+EGFR
ALT: 11 IU/L (ref 0–32)
AST: 17 IU/L (ref 0–40)
Albumin/Globulin Ratio: 1.6 (ref 1.2–2.2)
Albumin: 4.2 g/dL (ref 3.7–4.7)
Alkaline Phosphatase: 73 IU/L (ref 44–121)
BUN/Creatinine Ratio: 21 (ref 12–28)
BUN: 16 mg/dL (ref 8–27)
Bilirubin Total: 0.5 mg/dL (ref 0.0–1.2)
CO2: 26 mmol/L (ref 20–29)
Calcium: 9.9 mg/dL (ref 8.7–10.3)
Chloride: 106 mmol/L (ref 96–106)
Creatinine, Ser: 0.77 mg/dL (ref 0.57–1.00)
Globulin, Total: 2.6 g/dL (ref 1.5–4.5)
Glucose: 92 mg/dL (ref 70–99)
Potassium: 4.6 mmol/L (ref 3.5–5.2)
Sodium: 144 mmol/L (ref 134–144)
Total Protein: 6.8 g/dL (ref 6.0–8.5)
eGFR: 82 mL/min/{1.73_m2} (ref >59)

## 2022-02-04 LAB — LIPID PANEL
Chol/HDL Ratio: 2.7 ratio (ref 0.0–4.4)
Cholesterol, Total: 147 mg/dL (ref 100–199)
HDL: 54 mg/dL (ref >39)
LDL Chol Calc (NIH): 80 mg/dL (ref 0–99)
Triglycerides: 63 mg/dL (ref 0–149)
VLDL Cholesterol Cal: 13 mg/dL (ref 5–40)

## 2022-02-09 ENCOUNTER — Ambulatory Visit (INDEPENDENT_AMBULATORY_CARE_PROVIDER_SITE_OTHER): Payer: No Typology Code available for payment source | Admitting: Emergency Medicine

## 2022-02-09 DIAGNOSIS — Z23 Encounter for immunization: Secondary | ICD-10-CM

## 2022-02-09 NOTE — Progress Notes (Signed)
Patient presents for Pneumonia Vaccine ( Prevnar 20). Injection given in Left Deltoid, Patient tolerated well.   Kathi Simpers, RN

## 2022-04-17 IMAGING — MG MM DIGITAL SCREENING BILAT W/ TOMO AND CAD
6 of 10 series · 6 of 30 positions shown · non-contrast
Comparison: Previous exam(s).

CLINICAL DATA: Screening.

EXAM:
DIGITAL SCREENING BILATERAL MAMMOGRAM WITH TOMOSYNTHESIS AND CAD
TECHNIQUE: Bilateral screening digital craniocaudal and mediolateral oblique
mammograms were obtained. Bilateral screening digital breast
tomosynthesis was performed. The images were evaluated with
computer-aided detection.

[L CC synth-2D]
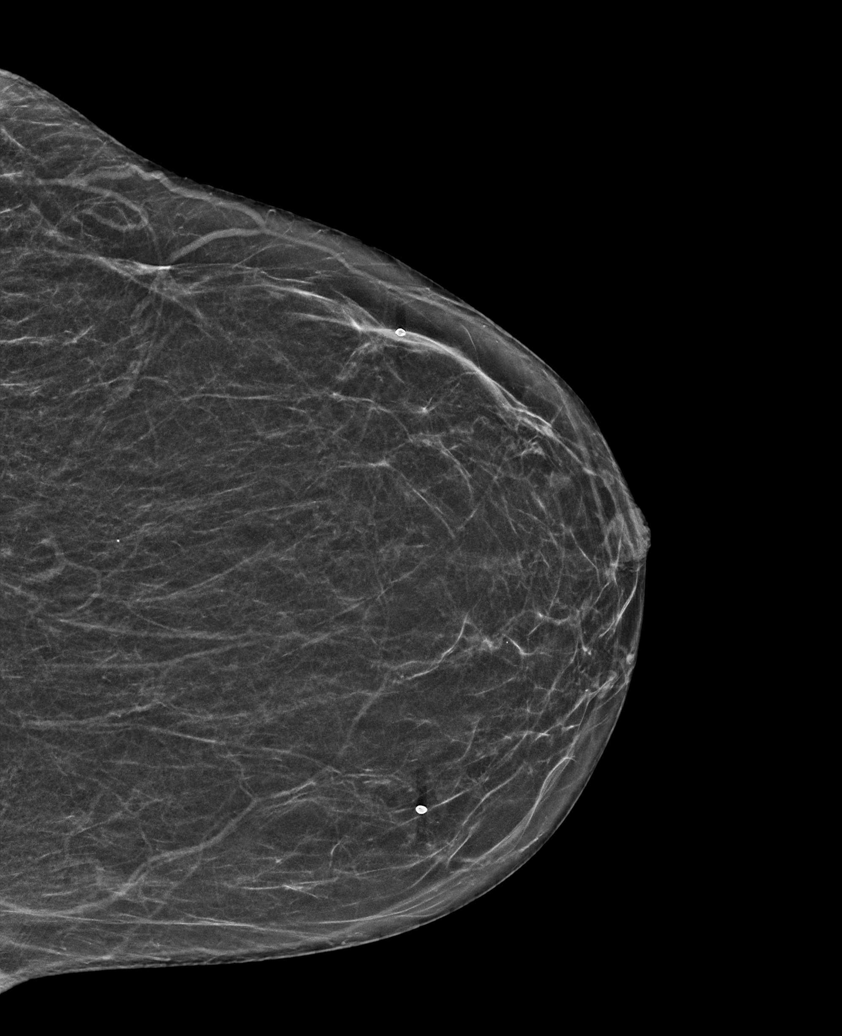

[R MLO synth-2D (1 of 2)]
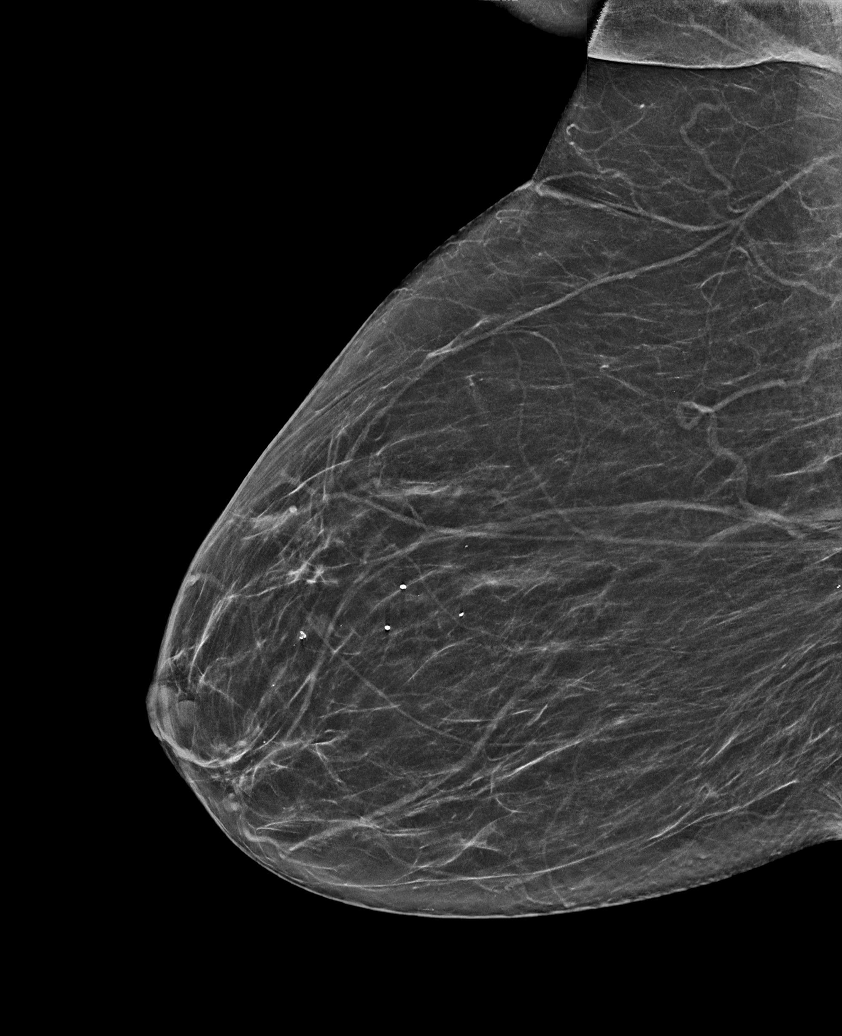

[R CC synth-2D]
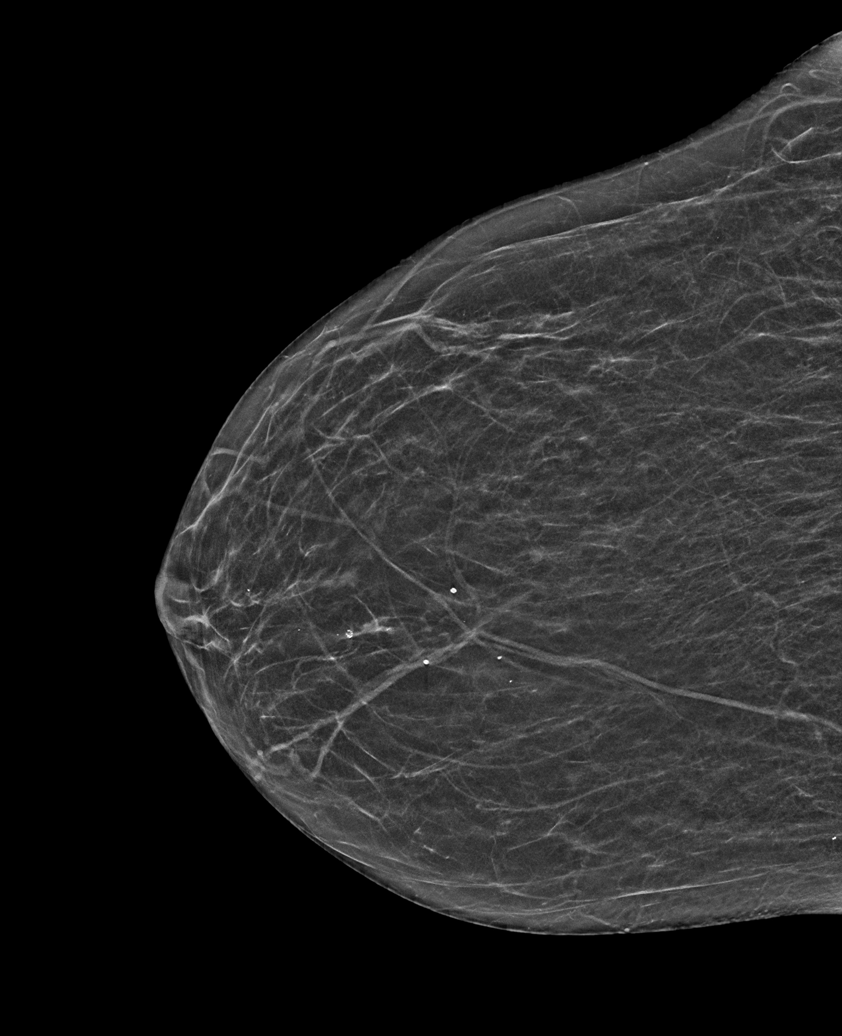

[R MLO synth-2D (2 of 2)]
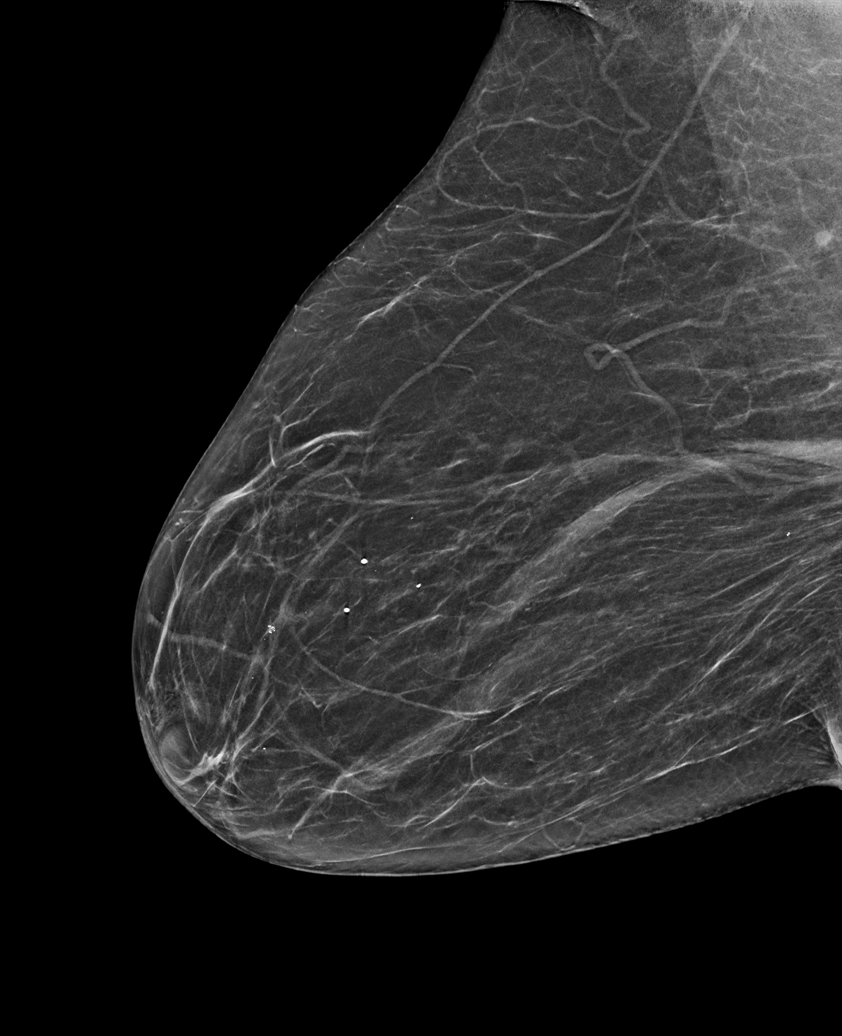

[L MLO synth-2D]
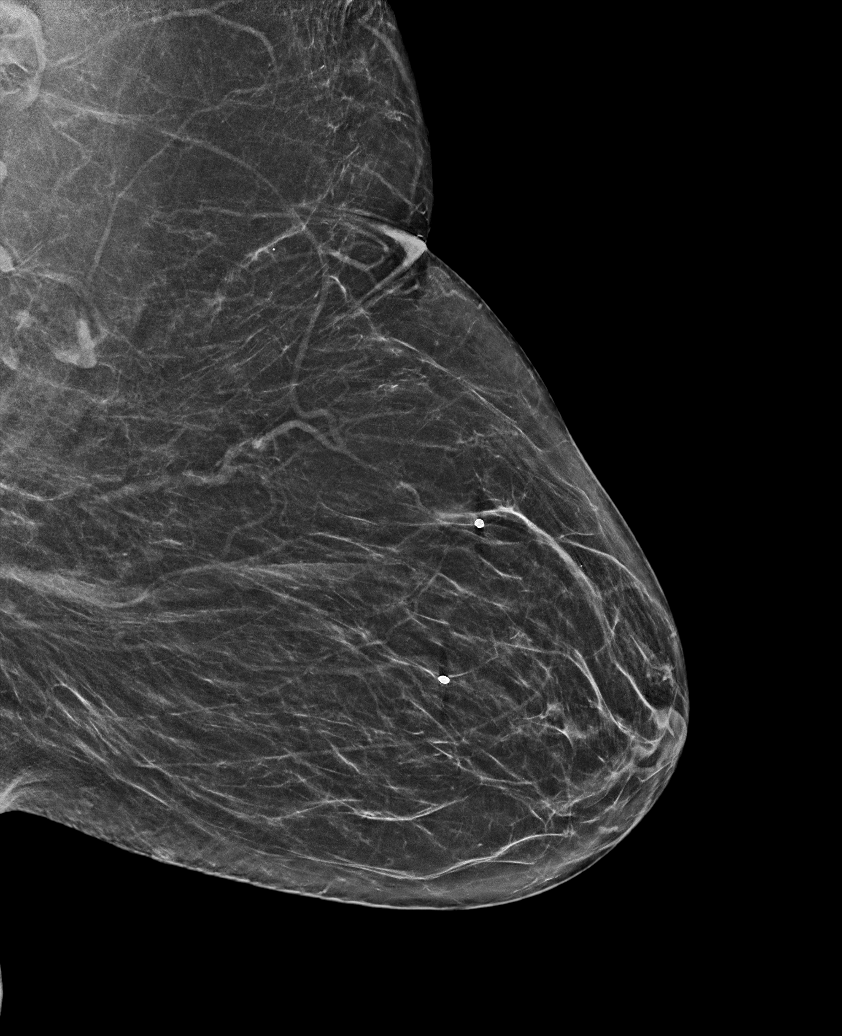

[R CC tomo · tomo slice 24/47.0]
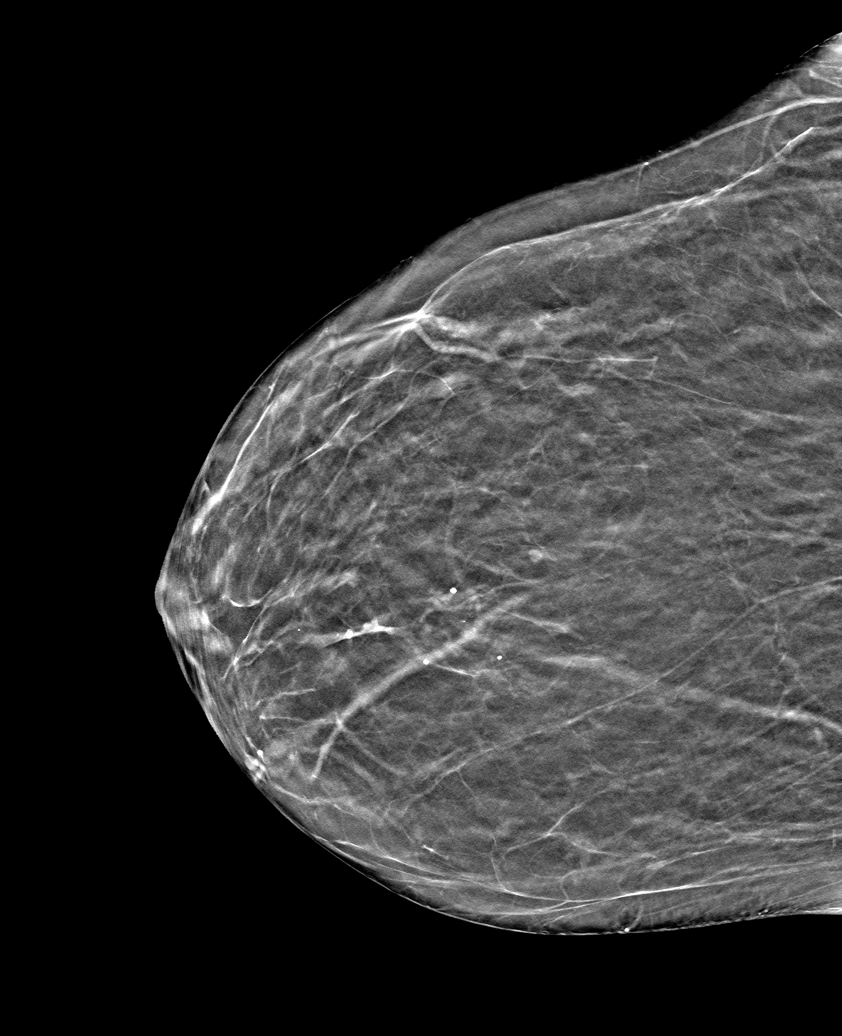

[6 of 30 positions shown; findings below may reference images not displayed]

ACR Breast Density Category b: There are scattered areas of
fibroglandular density.
FINDINGS: There are no findings suspicious for malignancy.
IMPRESSION: No mammographic evidence of malignancy. A result letter of this
screening mammogram will be mailed directly to the patient.

RECOMMENDATION:
Screening mammogram in one year. (Code:51-O-LD2)

BI-RADS CATEGORY  1: Negative.

## 2022-06-22 DIAGNOSIS — Z6834 Body mass index (BMI) 34.0-34.9, adult: Secondary | ICD-10-CM | POA: Diagnosis not present

## 2022-06-22 DIAGNOSIS — Z008 Encounter for other general examination: Secondary | ICD-10-CM | POA: Diagnosis not present

## 2022-06-22 DIAGNOSIS — I129 Hypertensive chronic kidney disease with stage 1 through stage 4 chronic kidney disease, or unspecified chronic kidney disease: Secondary | ICD-10-CM | POA: Diagnosis not present

## 2022-06-22 DIAGNOSIS — E669 Obesity, unspecified: Secondary | ICD-10-CM | POA: Diagnosis not present

## 2022-06-22 DIAGNOSIS — N182 Chronic kidney disease, stage 2 (mild): Secondary | ICD-10-CM | POA: Diagnosis not present

## 2022-06-22 DIAGNOSIS — E785 Hyperlipidemia, unspecified: Secondary | ICD-10-CM | POA: Diagnosis not present

## 2022-07-06 ENCOUNTER — Other Ambulatory Visit: Payer: Self-pay | Admitting: Family Medicine

## 2022-07-06 DIAGNOSIS — E559 Vitamin D deficiency, unspecified: Secondary | ICD-10-CM

## 2022-07-06 DIAGNOSIS — E7849 Other hyperlipidemia: Secondary | ICD-10-CM

## 2022-07-06 DIAGNOSIS — I10 Essential (primary) hypertension: Secondary | ICD-10-CM

## 2022-08-07 ENCOUNTER — Encounter: Payer: Self-pay | Admitting: Family Medicine

## 2022-08-07 ENCOUNTER — Ambulatory Visit (INDEPENDENT_AMBULATORY_CARE_PROVIDER_SITE_OTHER): Payer: No Typology Code available for payment source

## 2022-08-07 ENCOUNTER — Ambulatory Visit (INDEPENDENT_AMBULATORY_CARE_PROVIDER_SITE_OTHER): Payer: No Typology Code available for payment source | Admitting: Family Medicine

## 2022-08-07 VITALS — BP 134/78 | HR 66 | Temp 98.0°F | Ht 67.0 in | Wt 226.8 lb

## 2022-08-07 DIAGNOSIS — J3089 Other allergic rhinitis: Secondary | ICD-10-CM

## 2022-08-07 DIAGNOSIS — I10 Essential (primary) hypertension: Secondary | ICD-10-CM

## 2022-08-07 DIAGNOSIS — Z780 Asymptomatic menopausal state: Secondary | ICD-10-CM

## 2022-08-07 DIAGNOSIS — M85851 Other specified disorders of bone density and structure, right thigh: Secondary | ICD-10-CM | POA: Diagnosis not present

## 2022-08-07 DIAGNOSIS — E559 Vitamin D deficiency, unspecified: Secondary | ICD-10-CM | POA: Diagnosis not present

## 2022-08-07 DIAGNOSIS — M85852 Other specified disorders of bone density and structure, left thigh: Secondary | ICD-10-CM | POA: Diagnosis not present

## 2022-08-07 DIAGNOSIS — K5904 Chronic idiopathic constipation: Secondary | ICD-10-CM | POA: Diagnosis not present

## 2022-08-07 DIAGNOSIS — E7849 Other hyperlipidemia: Secondary | ICD-10-CM | POA: Diagnosis not present

## 2022-08-07 MED ORDER — FLUTICASONE PROPIONATE 50 MCG/ACT NA SUSP: NASAL | 3 refills | Status: DC

## 2022-08-07 MED ORDER — SIMVASTATIN 20 MG PO TABS: ORAL_TABLET | ORAL | 3 refills | Status: DC

## 2022-08-07 MED ORDER — LISINOPRIL 20 MG PO TABS: 20.0000 mg | ORAL_TABLET | Freq: Every day | ORAL | 3 refills | Status: DC

## 2022-08-07 MED ORDER — AZELASTINE HCL 0.1 % NA SOLN: 1.0000 | Freq: Two times a day (BID) | NASAL | 12 refills | Status: DC

## 2022-08-07 NOTE — Progress Notes (Signed)
Subjective: CC: Follow-up hypertension PCP: Raliegh Ip, DO VZC:HYIFO E Crichlow is a 71 y.o. female presenting to clinic today for:  1.  Hypertension associate with hyperlipidemia and obesity, constipation Patient really has worked hard over the last couple of years to lose weight.  She started out at about 282 pounds.  She did gain a few pounds over the holidays but is back on her strict die.  She does admit to some constipation and wants some advice on this.  She does not drink as much water as she should nor consume as much fiber as she should but she identifies these as areas of improvement  2.  Rhinorrhea, nasal congestion Patient reports that she has both nasal congestion and rhinorrhea.  She is about out of her Flonase and would like to get a renewal on this.  No fevers, chills.  This seems to happen around this time of year   ROS: Per HPI  Allergies  Allergen Reactions   Bee Venom     Local redness and swelling    Past Medical History:  Diagnosis Date   Anxiety    Hyperlipidemia    Hypertension    Sleep apnea    Was on CPAP.   Does not use now.     Current Outpatient Medications:    aspirin 81 MG tablet, Take 81 mg by mouth daily., Disp: , Rfl:    erythromycin ophthalmic ointment, Place 1 application. into the left eye at bedtime., Disp: 3.5 g, Rfl: 0   fluticasone (FLONASE) 50 MCG/ACT nasal spray, PLACE 2 SPRAYS INTO BOTH NOSTRILS EVERY DAY, Disp: 48 g, Rfl: 3   lisinopril (ZESTRIL) 20 MG tablet, TAKE ONE TABLET BY MOUTH DAILY, Disp: 90 tablet, Rfl: 0   simvastatin (ZOCOR) 20 MG tablet, TAKE ONE TABLET BY MOUTH DAILY AT 6PM, Disp: 90 tablet, Rfl: 0   Vitamin D, Ergocalciferol, (DRISDOL) 1.25 MG (50000 UNIT) CAPS capsule, TAKE ONE CAPSULE BY MOUTH every SEVEN DAYS, Disp: 12 capsule, Rfl: 3 Social History   Socioeconomic History   Marital status: Single    Spouse name: Not on file   Number of children: 0   Years of education: 16   Highest education level:  Master's degree (e.g., MA, MS, MEng, MEd, MSW, MBA)  Occupational History   Occupation: Retired     Comment: Advice worker   Tobacco Use   Smoking status: Never   Smokeless tobacco: Never  Vaping Use   Vaping Use: Never used  Substance and Sexual Activity   Alcohol use: No   Drug use: No   Sexual activity: Not Currently    Birth control/protection: Surgical  Other Topics Concern   Not on file  Social History Narrative   Lives with patches (cat) with diabetes.    Took care of her mother until Jan 08, 2018 when she passed away, her father passed away when she was about 62 - so she retired early to care for him   Her brother and 2 nieces live close and visit frequently   Social Determinants of Health   Financial Resource Strain: Low Risk  (09/22/2021)   Overall Financial Resource Strain (CARDIA)    Difficulty of Paying Living Expenses: Not hard at all  Food Insecurity: No Food Insecurity (09/22/2021)   Hunger Vital Sign    Worried About Running Out of Food in the Last Year: Never true    Ran Out of Food in the Last Year: Never true  Transportation Needs: No Transportation Needs (  09/22/2021)   PRAPARE - Administrator, Civil Service (Medical): No    Lack of Transportation (Non-Medical): No  Physical Activity: Insufficiently Active (09/22/2021)   Exercise Vital Sign    Days of Exercise per Week: 7 days    Minutes of Exercise per Session: 20 min  Stress: No Stress Concern Present (09/22/2021)   [employer] of Occupational Health - Occupational Stress Questionnaire    Feeling of Stress : Not at all  Social Connections: Moderately Integrated (09/22/2021)   Social Connection and Isolation Panel [NHANES]    Frequency of Communication with Friends and Family: Not on file    Frequency of Social Gatherings with Friends and Family: More than three times a week    Attends Religious Services: More than 4 times per year    Active Member of [employer] or Organizations: Yes    Attends  Banker Meetings: More than 4 times per year    Marital Status: Never married  Intimate Partner Violence: Not At Risk (09/22/2021)   Humiliation, Afraid, Rape, and Kick questionnaire    Fear of Current or Ex-Partner: No    Emotionally Abused: No    Physically Abused: No    Sexually Abused: No   Family History  Problem Relation Age of Onset   Diabetes Mother    Hypertension Mother    Irregular heart beat Mother        pacemaker   Hearing loss Mother        right   Vision loss Mother        macular degeneration   Rheumatic fever Father        heart murmur   Pneumonia Father    Cancer - Cervical Sister    Heart disease Paternal Aunt    Cancer Paternal Aunt        tumor behind eye   Diabetes Maternal Grandmother    Vision loss Maternal Grandmother    Heart disease Paternal Grandfather    Breast cancer Neg Hx     Objective: Office vital signs reviewed. BP 134/78   Pulse 66   Temp 98 F (36.7 C)   Ht 5\' 7"  (1.702 m)   Wt 226 lb 12.8 oz (102.9 kg)   SpO2 99%   BMI 35.52 kg/m   Physical Examination:  General: Awake, alert, obese, No acute distress HEENT:sclera white, MMM Cardio: regular rate and rhythm, S1S2 heard, no murmurs appreciated Pulm: clear to auscultation bilaterally, no wheezes, rhonchi or rales; normal work of breathing on room air Extremities: Venous stasis changes to the skin bilaterally MSK: Ambulating independently.  Right anterior lower leg with small healing hematoma  Assessment/ Plan: 71 y.o. female   Essential hypertension - Plan: lisinopril (ZESTRIL) 20 MG tablet  Other hyperlipidemia - Plan: simvastatin (ZOCOR) 20 MG tablet  Chronic idiopathic constipation  Non-seasonal allergic rhinitis, unspecified trigger - Plan: fluticasone (FLONASE) 50 MCG/ACT nasal spray, azelastine (ASTELIN) 0.1 % nasal spray  Vitamin D deficiency - Plan: VITAMIN D 25 Hydroxy (Vit-D Deficiency, Fractures), DG [facility] DEXA  Blood pressure is controlled.  No  changes.  Lisinopril renewed  Zocor renewed.  Not yet due for fasting lipid  Discussed increasing fiber.  Handout given on how to utilize MiraLAX if needed.  Hydrate well.  Could consider Trulance or Linzess if symptoms are refractory  For the rhinorrhea she is experiencing of added Astelin nasal spray.  May continue Flonase if needed for sinus congestion as well.  Check vitamin D level  and collect DEXA scan given known vitamin D deficiency  No orders of the defined types were placed in this encounter.  No orders of the defined types were placed in this encounter.    Raliegh Ip, DO [facility] [phone]

## 2022-08-07 NOTE — Patient Instructions (Addendum)
Thank you for coming in to clinic today.  1. Your symptoms are consistent with Constipation, likely cause of your General Abdominal Pain / Cramping. 2. If no improvement with increased fiber and water, start 17g or 1 capful daily, may adjust dose up or down by half a capful every few days. Recommend to take this medicine daily for next 1-2 weeks, then may need to use it longer if needed. - Goal is to have soft regular bowel movement 1-3x daily, if too runny or diarrhea, then reduce dose of the medicine  Improve water intake, hydration will help Also recommend increased vegetables, fruits, fiber intake Can try daily Metamucil or Fiber supplement at pharmacy over the counter  Follow-up if symptoms are not improving with bowel movements, or if pain worsens, develop fevers, nausea, vomiting. If you have any other questions or concerns, please feel free to call the clinic to contact me. You may also schedule an earlier appointment if necessary.  However, if your symptoms get significantly worse, please go to the Emergency Department to seek immediate medical attention.  Bone Density Test A bone density test uses a type of X-ray to measure the amount of calcium and other minerals in a person's bones. It can measure bone density in the hip and the spine. The test is similar to having a regular X-ray. This test may also be called: Bone densitometry. Bone mineral density test. Dual-energy X-ray absorptiometry (DEXA). You may have this test to: Diagnose a condition that causes weak or thin bones (osteoporosis). Screen you for osteoporosis. Predict your risk for a broken bone (fracture). Determine how well your osteoporosis treatment is working. Tell a health care provider about: Any allergies you have. All medicines you are taking, including vitamins, herbs, eye drops, creams, and over-the-counter medicines. Any problems you or family members have had with anesthetic medicines. Any blood disorders  you have. Any surgeries you have had. Any medical conditions you have. Whether you are pregnant or may be pregnant. Any medical tests you have had within the past 14 days that used contrast material. What are the risks? Generally, this is a safe test. However, it does expose you to a small amount of radiation, which can slightly increase your cancer risk. What happens before the test? Do not take any calcium supplements within the 24 hours before your test. You will need to remove all metal jewelry, eyeglasses, removable dental appliances, and any other metal objects on your body. What happens during the test?  You will lie down on an exam table. There will be an X-ray generator below you and an imaging device above you. Other devices, such as boxes or braces, may be used to position your body properly for the scan. The machine will slowly scan your body. You will need to keep very still while the machine does the scan. The images will show up on a screen in the room. Images will be examined by a specialist after your test is finished. The procedure may vary among health care providers and hospitals. What can I expect after the test? It is up to you to get the results of your test. Ask your health care provider, or the department that is doing the test, when your results will be ready. Summary A bone density test is an imaging test that uses a type of X-ray to measure the amount of calcium and other minerals in your bones. The test may be used to diagnose or screen you for a condition that  causes weak or thin bones (osteoporosis), predict your risk for a broken bone (fracture), or determine how well your osteoporosis treatment is working. Do not take any calcium supplements within 24 hours before your test. Ask your health care provider, or the department that is doing the test, when your results will be ready. This information is not intended to replace advice given to you by your health  care provider. Make sure you discuss any questions you have with your health care provider. Document Revised: 05/07/2021 Document Reviewed: 02/08/2020 Elsevier Patient Education  2023 [employer].

## 2022-08-08 LAB — VITAMIN D 25 HYDROXY (VIT D DEFICIENCY, FRACTURES): Vit D, 25-Hydroxy: 51.6 ng/mL (ref 30.0–100.0)

## 2022-09-23 ENCOUNTER — Ambulatory Visit (INDEPENDENT_AMBULATORY_CARE_PROVIDER_SITE_OTHER): Payer: No Typology Code available for payment source

## 2022-09-23 VITALS — Ht 67.0 in | Wt 226.0 lb

## 2022-09-23 DIAGNOSIS — Z0000 Encounter for general adult medical examination without abnormal findings: Secondary | ICD-10-CM

## 2022-09-23 NOTE — Patient Instructions (Signed)
Rhonda Daniel , Thank you for taking time to come for your Medicare Wellness Visit. I appreciate your ongoing commitment to your health goals. Please review the following plan we discussed and let me know if I can assist you in the future.   These are the goals we discussed:  Goals      DIET - INCREASE WATER INTAKE     Try to drink 6-8 glasses of water daily     Exercise 3x per week (30 min per time)     Become more active - work on  building upper body strength.  [employer] is a great option.      Patient Stated     09/19/2020 AWV Goal: Exercise for General Health  Patient will verbalize understanding of the benefits of increased physical activity: Exercising regularly is important. It will improve your overall fitness, flexibility, and endurance. Regular exercise also will improve your overall health. It can help you control your weight, reduce stress, and improve your bone density. Over the next year, patient will increase physical activity as tolerated with a goal of at least 150 minutes of moderate physical activity per week.  You can tell that you are exercising at a moderate intensity if your heart starts beating faster and you start breathing faster but can still hold a conversation. Moderate-intensity exercise ideas include: Walking 1 mile (1.6 km) in about 15 minutes Biking Hiking Golfing Dancing Water aerobics Patient will verbalize understanding of everyday activities that increase physical activity by providing examples like the following: Yard work, such as: Sales promotion account executive Gardening Washing windows or floors Patient will be able to explain general safety guidelines for exercising:  Before you start a new exercise program, talk with your health care provider. Do not exercise so much that you hurt yourself, feel dizzy, or get very short of breath. Wear comfortable clothes and wear  shoes with good support. Drink plenty of water while you exercise to prevent dehydration or heat stroke. Work out until your breathing and your heartbeat get faster.      Weight (lb) < 175 lb (79.4 kg)        This is a list of the screening recommended for you and due dates:  Health Maintenance  Topic Date Due   COVID-19 Vaccine (5 - 2023-24 season) 05/08/2022   Zoster (Shingles) Vaccine (1 of 2) 11/06/2022*   Flu Shot  12/06/2022*   Hepatitis C Screening: USPSTF Recommendation to screen - Ages 18-79 yo.  02/04/2023*   Colon Cancer Screening  08/08/2023*   Mammogram  11/13/2022   Medicare Annual Wellness Visit  09/24/2023   DEXA scan (bone density measurement)  08/08/2027   Pneumonia Vaccine  Completed   HPV Vaccine  Aged Out   DTaP/Tdap/Td vaccine  Discontinued  *Topic was postponed. The date shown is not the original due date.    Advanced directives: Please bring a copy of your health care power of attorney and living will to the office to be added to your chart at your convenience.   Conditions/risks identified: Aim for 30 minutes of exercise or brisk walking, 6-8 glasses of water, and 5 servings of fruits and vegetables each day.   Next appointment: Follow up in one year for your annual wellness visit    Preventive Care 65 Years and Older, Female Preventive care refers to lifestyle choices and visits with your health care provider that can promote  health and wellness. What does preventive care include? A yearly physical exam. This is also called an annual well check. Dental exams once or twice a year. Routine eye exams. Ask your health care provider how often you should have your eyes checked. Personal lifestyle choices, including: Daily care of your teeth and gums. Regular physical activity. Eating a healthy diet. Avoiding tobacco and drug use. Limiting alcohol use. Practicing safe sex. Taking low-dose aspirin every day. Taking vitamin and mineral supplements as  recommended by your health care provider. What happens during an annual well check? The services and screenings done by your health care provider during your annual well check will depend on your age, overall health, lifestyle risk factors, and family history of disease. Counseling  Your health care provider may ask you questions about your: Alcohol use. Tobacco use. Drug use. Emotional well-being. Home and relationship well-being. Sexual activity. Eating habits. History of falls. Memory and ability to understand (cognition). Work and work Statistician. Reproductive health. Screening  You may have the following tests or measurements: Height, weight, and BMI. Blood pressure. Lipid and cholesterol levels. These may be checked every 5 years, or more frequently if you are over 33 years old. Skin check. Lung cancer screening. You may have this screening every year starting at age 9 if you have a 30-pack-year history of smoking and currently smoke or have quit within the past 15 years. Fecal occult blood test (FOBT) of the stool. You may have this test every year starting at age 12. Flexible sigmoidoscopy or colonoscopy. You may have a sigmoidoscopy every 5 years or a colonoscopy every 10 years starting at age 101. Hepatitis C blood test. Hepatitis B blood test. Sexually transmitted disease (STD) testing. Diabetes screening. This is done by checking your blood sugar (glucose) after you have not eaten for a while (fasting). You may have this done every 1-3 years. Bone density scan. This is done to screen for osteoporosis. You may have this done starting at age 53. Mammogram. This may be done every 1-2 years. Talk to your health care provider about how often you should have regular mammograms. Talk with your health care provider about your test results, treatment options, and if necessary, the need for more tests. Vaccines  Your health care provider may recommend certain vaccines, such  as: Influenza vaccine. This is recommended every year. Tetanus, diphtheria, and acellular pertussis (Tdap, Td) vaccine. You may need a Td booster every 10 years. Zoster vaccine. You may need this after age 99. Pneumococcal 13-valent conjugate (PCV13) vaccine. One dose is recommended after age 32. Pneumococcal polysaccharide (PPSV23) vaccine. One dose is recommended after age 28. Talk to your health care provider about which screenings and vaccines you need and how often you need them. This information is not intended to replace advice given to you by your health care provider. Make sure you discuss any questions you have with your health care provider. Document Released: 09/20/2015 Document Revised: 05/13/2016 Document Reviewed: 06/25/2015 Elsevier Interactive Patient Education  2017 [employer] Prevention in the Home Falls can cause injuries. They can happen to people of all ages. There are many things you can do to make your home safe and to help prevent falls. What can I do on the outside of my home? Regularly fix the edges of walkways and driveways and fix any cracks. Remove anything that might make you trip as you walk through a door, such as a raised step or threshold. Trim any bushes or  trees on the path to your home. Use bright outdoor lighting. Clear any walking paths of anything that might make someone trip, such as rocks or tools. Regularly check to see if handrails are loose or broken. Make sure that both sides of any steps have handrails. Any raised decks and porches should have guardrails on the edges. Have any leaves, snow, or ice cleared regularly. Use sand or salt on walking paths during winter. Clean up any spills in your garage right away. This includes oil or grease spills. What can I do in the bathroom? Use night lights. Install grab bars by the toilet and in the tub and shower. Do not use towel bars as grab bars. Use non-skid mats or decals in the tub or  shower. If you need to sit down in the shower, use a plastic, non-slip stool. Keep the floor dry. Clean up any water that spills on the floor as soon as it happens. Remove soap buildup in the tub or shower regularly. Attach bath mats securely with double-sided non-slip rug tape. Do not have throw rugs and other things on the floor that can make you trip. What can I do in the bedroom? Use night lights. Make sure that you have a light by your bed that is easy to reach. Do not use any sheets or blankets that are too big for your bed. They should not hang down onto the floor. Have a firm chair that has side arms. You can use this for support while you get dressed. Do not have throw rugs and other things on the floor that can make you trip. What can I do in the kitchen? Clean up any spills right away. Avoid walking on wet floors. Keep items that you use a lot in easy-to-reach places. If you need to reach something above you, use a strong step stool that has a grab bar. Keep electrical cords out of the way. Do not use floor polish or wax that makes floors slippery. If you must use wax, use non-skid floor wax. Do not have throw rugs and other things on the floor that can make you trip. What can I do with my stairs? Do not leave any items on the stairs. Make sure that there are handrails on both sides of the stairs and use them. Fix handrails that are broken or loose. Make sure that handrails are as long as the stairways. Check any carpeting to make sure that it is firmly attached to the stairs. Fix any carpet that is loose or worn. Avoid having throw rugs at the top or bottom of the stairs. If you do have throw rugs, attach them to the floor with carpet tape. Make sure that you have a light switch at the top of the stairs and the bottom of the stairs. If you do not have them, ask someone to add them for you. What else can I do to help prevent falls? Wear shoes that: Do not have high heels. Have  rubber bottoms. Are comfortable and fit you well. Are closed at the toe. Do not wear sandals. If you use a stepladder: Make sure that it is fully opened. Do not climb a closed stepladder. Make sure that both sides of the stepladder are locked into place. Ask someone to hold it for you, if possible. Clearly mark and make sure that you can see: Any grab bars or handrails. First and last steps. Where the edge of each step is. Use tools that help you  move around (mobility aids) if they are needed. These include: Canes. Walkers. Scooters. Crutches. Turn on the lights when you go into a dark area. Replace any light bulbs as soon as they burn out. Set up your furniture so you have a clear path. Avoid moving your furniture around. If any of your floors are uneven, fix them. If there are any pets around you, be aware of where they are. Review your medicines with your doctor. Some medicines can make you feel dizzy. This can increase your chance of falling. Ask your doctor what other things that you can do to help prevent falls. This information is not intended to replace advice given to you by your health care provider. Make sure you discuss any questions you have with your health care provider. Document Released: 06/20/2009 Document Revised: 01/30/2016 Document Reviewed: 09/28/2014 Elsevier Interactive Patient Education  2017 [employer].

## 2022-09-23 NOTE — Progress Notes (Signed)
Subjective:   Rhonda Daniel is a 72 y.o. female who presents for Medicare Annual (Subsequent) preventive examination.  I connected with  Kristen Cardinal Eimer on 09/23/22 by a audio enabled telemedicine application and verified that I am speaking with the correct person using two identifiers.  Patient Location: Home  Provider Location: Home Office  I discussed the limitations of evaluation and management by telemedicine. The patient expressed understanding and agreed to proceed.  Review of Systems     Cardiac Risk Factors include: advanced age (>79men, >55 women);dyslipidemia;hypertension     Objective:    Today's Vitals   09/23/22 1449  Weight: 226 lb (102.5 kg)  Height: 5\' 7"  (1.702 m)   Body mass index is 35.4 kg/m.     09/23/2022    3:02 PM 09/22/2021    4:26 PM 09/19/2020    1:59 PM 09/19/2019    2:17 PM 09/15/2018    3:44 PM 12/29/2016    8:42 AM  Advanced Directives  Does Patient Have a Medical Advance Directive? Yes Yes Yes No Yes No  Type of Advance Directive Living will;Healthcare Power of [place];Living will Living will  Living will   Does patient want to make changes to medical advance directive? No - Patient declined  No - Patient declined  No - Patient declined   Copy of [place] in Chart? No - copy requested No - copy requested      Would patient like information on creating a medical advance directive?    No - Patient declined  Yes (MAU/Ambulatory/Procedural Areas - Information given)    Current Medications (verified) Outpatient Encounter Medications as of 09/23/2022  Medication Sig   aspirin 81 MG tablet Take 81 mg by mouth daily.   azelastine (ASTELIN) 0.1 % nasal spray Place 1 spray into both nostrils 2 (two) times daily.   erythromycin ophthalmic ointment Place 1 application. into the left eye at bedtime.   fluticasone (FLONASE) 50 MCG/ACT nasal spray PLACE 2 SPRAYS INTO BOTH NOSTRILS EVERY DAY   lisinopril  (ZESTRIL) 20 MG tablet Take 1 tablet (20 mg total) by mouth daily.   simvastatin (ZOCOR) 20 MG tablet TAKE ONE TABLET BY MOUTH DAILY AT 6PM   Vitamin D, Ergocalciferol, (DRISDOL) 1.25 MG (50000 UNIT) CAPS capsule TAKE ONE CAPSULE BY MOUTH every SEVEN DAYS   No facility-administered encounter medications on file as of 09/23/2022.    Allergies (verified) Bee venom   History: Past Medical History:  Diagnosis Date   Anxiety    Hyperlipidemia    Hypertension    Sleep apnea    Was on CPAP.   Does not use now.    Past Surgical History:  Procedure Laterality Date   ABDOMINAL HYSTERECTOMY     APPENDECTOMY     with malignancy of appendix   CHOLECYSTECTOMY     COLON SURGERY     preventative - with appendix malignancy   HERNIA REPAIR     Family History  Problem Relation Age of Onset   Diabetes Mother    Hypertension Mother    Irregular heart beat Mother        pacemaker   Hearing loss Mother        right   Vision loss Mother        macular degeneration   Rheumatic fever Father        heart murmur   Pneumonia Father    Cancer - Cervical Sister  Heart disease Paternal Aunt    Cancer Paternal Aunt        tumor behind eye   Diabetes Maternal Grandmother    Vision loss Maternal Grandmother    Heart disease Paternal Grandfather    Breast cancer Neg Hx    Social History   Socioeconomic History   Marital status: Single    Spouse name: Not on file   Number of children: 0   Years of education: 16   Highest education level: Master's degree (e.g., MA, MS, MEng, MEd, MSW, MBA)  Occupational History   Occupation: Retired     Comment: Advice worker   Tobacco Use   Smoking status: Never   Smokeless tobacco: Never  Vaping Use   Vaping Use: Never used  Substance and Sexual Activity   Alcohol use: No   Drug use: No   Sexual activity: Not Currently    Birth control/protection: Surgical  Other Topics Concern   Not on file  Social History Narrative   Lives with patches  (cat) with diabetes.    Took care of her mother until 01-07-2018 when she passed away, her father passed away when she was about 44 - so she retired early to care for him   Her brother and 2 nieces live close and visit frequently   Social Determinants of Health   Financial Resource Strain: Low Risk  (09/23/2022)   Overall Financial Resource Strain (CARDIA)    Difficulty of Paying Living Expenses: Not hard at all  Food Insecurity: No Food Insecurity (09/23/2022)   Hunger Vital Sign    Worried About Running Out of Food in the Last Year: Never true    Ran Out of Food in the Last Year: Never true  Transportation Needs: No Transportation Needs (09/23/2022)   PRAPARE - Administrator, Civil Service (Medical): No    Lack of Transportation (Non-Medical): No  Physical Activity: Sufficiently Active (09/23/2022)   Exercise Vital Sign    Days of Exercise per Week: 5 days    Minutes of Exercise per Session: 30 min  Stress: No Stress Concern Present (09/23/2022)   [employer] of Occupational Health - Occupational Stress Questionnaire    Feeling of Stress : Not at all  Social Connections: Moderately Integrated (09/23/2022)   Social Connection and Isolation Panel [NHANES]    Frequency of Communication with Friends and Family: More than three times a week    Frequency of Social Gatherings with Friends and Family: Three times a week    Attends Religious Services: More than 4 times per year    Active Member of Clubs or Organizations: Yes    Attends Engineer, structural: More than 4 times per year    Marital Status: Never married    Tobacco Counseling Counseling given: Not Answered   Clinical Intake:  Pre-visit preparation completed: Yes  Pain : No/denies pain  Diabetes: No  How often do you need to have someone help you when you read instructions, pamphlets, or other written materials from your doctor or pharmacy?: 1 - Never  Diabetic?No   Interpreter Needed?:  No  Information entered by :: Kandis Fantasia LPN   Activities of Daily Living    09/23/2022    2:55 PM  In your present state of health, do you have any difficulty performing the following activities:  Hearing? 0  Vision? 0  Difficulty concentrating or making decisions? 0  Walking or climbing stairs? 0  Dressing or bathing? 0  Doing  errands, shopping? 0  Preparing Food and eating ? N  Using the Toilet? N  In the past six months, have you accidently leaked urine? N  Do you have problems with loss of bowel control? N  Managing your Medications? N  Managing your Finances? N  Housekeeping or managing your Housekeeping? N    Patient Care Team: Janora Norlander, DO as PCP - General (Family Medicine) Melina Schools, OD (Optometry) Minus Breeding, MD as Consulting Physician (Cardiology) Alphonsa Overall, MD as Consulting Physician (General Surgery)  Indicate any recent Medical Services you may have received from other than [facility] providers in the past year (date may be approximate).     Assessment:   This is a routine wellness examination for [place].  Hearing/Vision screen Hearing Screening - Comments:: Denies hearing difficulties  Vision Screening - Comments:: Wears rx glasses - up to date with routine eye exams with    Dietary issues and exercise activities discussed: Current Exercise Habits: Home exercise routine, Type of exercise: walking, Time (Minutes): 30, Frequency (Times/Week): 5, Weekly Exercise (Minutes/Week): 150, Intensity: Mild   Goals Addressed   None   Depression Screen    09/23/2022    3:02 PM 08/07/2022    8:44 AM 09/22/2021    4:33 PM 08/05/2021    1:21 PM 11/13/2020    7:58 AM 10/01/2020   11:00 AM 09/19/2020    2:02 PM  PHQ 2/9 Scores  PHQ - 2 Score 0 0 1 1 0 0 0  PHQ- 9 Score   2 3 0      Fall Risk    09/23/2022    2:50 PM 08/07/2022    8:44 AM 09/22/2021    4:20 PM 08/05/2021    1:20 PM 11/13/2020    7:58 AM  Fall Risk   Falls in the past year? 1  0 0 0 0  Number falls in past yr: 0  0    Injury with Fall? 0  0    Risk for fall due to : History of fall(s)  No Fall Risks    Follow up Falls evaluation completed;Education provided;Falls prevention discussed  Falls prevention discussed      FALL RISK PREVENTION PERTAINING TO THE HOME:  Any stairs in or around the home? Yes  If so, are there any without handrails? No  Home free of loose throw rugs in walkways, pet beds, electrical cords, etc? Yes  Adequate lighting in your home to reduce risk of falls? Yes   ASSISTIVE DEVICES UTILIZED TO PREVENT FALLS:  Life alert? No  Use of a cane, walker or w/c? No  Grab bars in the bathroom? Yes  Shower chair or bench in shower? No  Elevated toilet seat or a handicapped toilet? Yes   TIMED UP AND GO:  Was the test performed? No . Telephonic visit   Cognitive Function:    09/15/2018    4:12 PM 12/29/2016    8:49 AM  MMSE - Mini Mental State Exam  Orientation to time 5 5  Orientation to Place 5 5  Registration 3 3  Attention/ Calculation 5 5  Recall 3 3  Language- name 2 objects 2 2  Language- repeat 1 1  Language- follow 3 step command 3 3  Language- read & follow direction 1 1  Write a sentence 1 1  Copy design 1 1  Total score 30 30        09/23/2022    2:56 PM 09/19/2020  2:00 PM 09/19/2019    2:22 PM  6CIT Screen  What Year? 0 points 0 points 0 points  What month? 0 points 0 points 0 points  What time? 0 points 0 points 0 points  Count back from 20 0 points 0 points 0 points  Months in reverse 0 points 0 points 0 points  Repeat phrase 0 points 0 points 0 points  Total Score 0 points 0 points 0 points    Immunizations Immunization History  Administered Date(s) Administered   Moderna Sars-Covid-2 Vaccination 10/12/2019, 11/09/2019, 07/30/2020, 05/06/2021   PNEUMOCOCCAL CONJUGATE-20 02/09/2022   Tdap 05/02/2009    TDAP status: Up to date  Flu Vaccine status: Due, Education has been provided regarding the  importance of this vaccine. Advised may receive this vaccine at local pharmacy or Health Dept. Aware to provide a copy of the vaccination record if obtained from local pharmacy or Health Dept. Verbalized acceptance and understanding.  Pneumococcal vaccine status: Up to date  Covid-19 vaccine status: Information provided on how to obtain vaccines.   Qualifies for Shingles Vaccine? Yes   Zostavax completed No   Shingrix Completed?: No.    Education has been provided regarding the importance of this vaccine. Patient has been advised to call insurance company to determine out of pocket expense if they have not yet received this vaccine. Advised may also receive vaccine at local pharmacy or Health Dept. Verbalized acceptance and understanding.  Screening Tests Health Maintenance  Topic Date Due   COVID-19 Vaccine (5 - 2023-24 season) 05/08/2022   Zoster Vaccines- Shingrix (1 of 2) 11/06/2022 (Originally 06/17/1970)   INFLUENZA VACCINE  12/06/2022 (Originally 04/07/2022)   Hepatitis C Screening  02/04/2023 (Originally 06/17/1969)   COLONOSCOPY (Pts 45-41yrs Insurance coverage will need to be confirmed)  08/08/2023 (Originally 11/21/2018)   MAMMOGRAM  11/13/2022   Medicare Annual Wellness (AWV)  09/24/2023   DEXA SCAN  08/08/2027   Pneumonia Vaccine 44+ Years old  Completed   HPV VACCINES  Aged Out   DTaP/Tdap/Td  Discontinued    Health Maintenance  Health Maintenance Due  Topic Date Due   COVID-19 Vaccine (5 - 2023-24 season) 05/08/2022    Colorectal cancer screening: No longer required.   Mammogram status: Completed 11/12/21. Repeat every year  Bone Density status: Completed 08/07/22. Results reflect: Bone density results: OSTEOPENIA. Repeat every 2 years.  Lung Cancer Screening: (Low Dose CT Chest recommended if Age 91-80 years, 30 pack-year currently smoking OR have quit w/in 15years.) does not qualify.   Lung Cancer Screening Referral: n/a   Additional Screening:  Hepatitis C  Screening: does qualify; Completed at next office visit   Vision Screening: Recommended annual ophthalmology exams for early detection of glaucoma and other disorders of the eye. Is the patient up to date with their annual eye exam?  Yes  Who is the provider or what is the name of the office in which the patient attends annual eye exams? [facility]  If pt is not established with a provider, would they like to be referred to a provider to establish care? No .   Dental Screening: Recommended annual dental exams for proper oral hygiene  Community Resource Referral / Chronic Care Management: CRR required this visit?  No   CCM required this visit?  No      Plan:     I have personally reviewed and noted the following in the patient's chart:   Medical and social history Use of alcohol, tobacco or illicit drugs  Current medications and supplements including opioid prescriptions. Patient is not currently taking opioid prescriptions. Functional ability and status Nutritional status Physical activity Advanced directives List of other physicians Hospitalizations, surgeries, and ER visits in previous 12 months Vitals Screenings to include cognitive, depression, and falls Referrals and appointments  In addition, I have reviewed and discussed with patient certain preventive protocols, quality metrics, and best practice recommendations. A written personalized care plan for preventive services as well as general preventive health recommendations were provided to patient.     Durwin Nora, [place]   03/04/3150   Due to this being a virtual visit, the after visit summary with patients personalized plan was offered to patient via mail or my-chart. per request, patient was mailed a copy of AVS  Nurse Notes: Patient really appreciates the dedication and detailed care that she receives from her provider at the office and wants to make sure that she knows that she is appreciated.

## 2022-11-10 ENCOUNTER — Other Ambulatory Visit: Payer: Self-pay | Admitting: Family Medicine

## 2022-11-10 DIAGNOSIS — Z1231 Encounter for screening mammogram for malignant neoplasm of breast: Secondary | ICD-10-CM

## 2022-11-25 ENCOUNTER — Ambulatory Visit
Admission: RE | Admit: 2022-11-25 | Discharge: 2022-11-25 | Disposition: A | Discharge status: Home / Self Care | Payer: No Typology Code available for payment source | Source: Ambulatory Visit | Attending: Family Medicine | Admitting: Family Medicine

## 2022-11-25 DIAGNOSIS — Z1231 Encounter for screening mammogram for malignant neoplasm of breast: Secondary | ICD-10-CM | POA: Diagnosis not present

## 2023-02-10 ENCOUNTER — Encounter: Payer: Self-pay | Admitting: Family Medicine

## 2023-02-10 ENCOUNTER — Ambulatory Visit: Payer: No Typology Code available for payment source | Admitting: Family Medicine

## 2023-02-10 VITALS — BP 134/88 | HR 60 | Temp 98.5°F | Ht 67.0 in | Wt 236.0 lb

## 2023-02-10 DIAGNOSIS — M25561 Pain in right knee: Secondary | ICD-10-CM

## 2023-02-10 DIAGNOSIS — Z789 Other specified health status: Secondary | ICD-10-CM

## 2023-02-10 DIAGNOSIS — Z1159 Encounter for screening for other viral diseases: Secondary | ICD-10-CM

## 2023-02-10 DIAGNOSIS — Z0001 Encounter for general adult medical examination with abnormal findings: Secondary | ICD-10-CM | POA: Diagnosis not present

## 2023-02-10 DIAGNOSIS — R413 Other amnesia: Secondary | ICD-10-CM

## 2023-02-10 DIAGNOSIS — E7800 Pure hypercholesterolemia, unspecified: Secondary | ICD-10-CM | POA: Diagnosis not present

## 2023-02-10 DIAGNOSIS — G8929 Other chronic pain: Secondary | ICD-10-CM | POA: Diagnosis not present

## 2023-02-10 DIAGNOSIS — I10 Essential (primary) hypertension: Secondary | ICD-10-CM | POA: Diagnosis not present

## 2023-02-10 DIAGNOSIS — Z6836 Body mass index (BMI) 36.0-36.9, adult: Secondary | ICD-10-CM | POA: Diagnosis not present

## 2023-02-10 DIAGNOSIS — Z0000 Encounter for general adult medical examination without abnormal findings: Secondary | ICD-10-CM

## 2023-02-10 DIAGNOSIS — G4733 Obstructive sleep apnea (adult) (pediatric): Secondary | ICD-10-CM | POA: Diagnosis not present

## 2023-02-10 DIAGNOSIS — I872 Venous insufficiency (chronic) (peripheral): Secondary | ICD-10-CM

## 2023-02-10 LAB — HEPATITIS C ANTIBODY

## 2023-02-10 LAB — CBC
MCV: 93 fL (ref 79–97)
Platelets: 232 10*3/uL (ref 150–450)

## 2023-02-10 LAB — T4, FREE

## 2023-02-10 LAB — BAYER DCA HB A1C WAIVED: HB A1C (BAYER DCA - WAIVED): 5.1 % (ref 4.8–5.6)

## 2023-02-10 NOTE — Patient Instructions (Signed)
Not cellulitis on legs.  This is hemosiderin deposition from swelling/ trauma to skin.  Compression hose will reduce swelling.  Orders and referrals are in. Plan to see me back after you see the surgeon and make a date for surgery.  We can do your preop clearance within 3 months of scheduled surgery.

## 2023-02-10 NOTE — Progress Notes (Signed)
Rhonda Daniel is a 72 y.o. female presents to office today for annual physical exam examination.    Concerns today include: 1.  Right knee pain Patient notes that her right knee really has been bothering her quite a bit.  She sometimes feels like that right knee wants to give out.  Her mobility has been limited secondary to this right knee pain and she is not walking like she used to.  She subsequently has gained a little weight.  Her niece, who is a physical therapist, recommended an orthopedist to her and she is asking for referral today to Dr. Bonna Gains in [place].  She also requests walker, toilet riser with handles and shower chair with handles.  She is not currently utilizing any cane or walker but feels like this might be beneficial to her, particularly postop.  She will be residing with her niece in [place] after the surgery as she was invited to do so.  2.?  Cellulitis Her niece is concerned about a cellulitis in her legs.  She admits that she has had discoloration of bilateral legs for years and that the right lower extremity really became discolored after an injury several years ago.  She denies any warmth, redness or pain.  She admits to some edema in the legs and does not wear compression hose but would be willing to do so.  3.  Memory changes Patient reports that she has been having some noticeable memory changes both personally and observed by her family members.  She never gets lost but sometimes is not sure why she took a certain route over her typical route when driving.  She has never forgotten bills but sometimes forgets someone's name even though she recognizes the face and can recall everything else about them.  She does have some urinary incontinence occasionally wears depends for this.  Occupation: retired, Marital status: single, Substance use: none Diet: typical [place] , Exercise: none Last eye exam: needs Last dental exam: needs Last colonoscopy: overdue Last  mammogram: UTD Last pap smear: na Refills needed today: none Immunizations needed: Immunization History  Administered Date(s) Administered   Moderna Sars-Covid-2 Vaccination 10/12/2019, 11/09/2019, 07/30/2020, 05/06/2021   PNEUMOCOCCAL CONJUGATE-20 02/09/2022   Tdap 05/02/2009     Past Medical History:  Diagnosis Date   Anxiety    Hyperlipidemia    Hypertension    Sleep apnea    Was on CPAP.   Does not use now.    Social History   Socioeconomic History   Marital status: Single    Spouse name: Not on file   Number of children: 0   Years of education: 16   Highest education level: Master's degree (e.g., MA, MS, MEng, MEd, MSW, MBA)  Occupational History   Occupation: Retired     Comment: Advice worker   Tobacco Use   Smoking status: Never   Smokeless tobacco: Never  Vaping Use   Vaping Use: Never used  Substance and Sexual Activity   Alcohol use: No   Drug use: No   Sexual activity: Not Currently    Birth control/protection: Surgical  Other Topics Concern   Not on file  Social History Narrative   Lives with patches (cat) with diabetes.    Took care of her mother until 03-Mar-2018 when she passed away, her father passed away when she was about 67 - so she retired early to care for him   Her brother and 2 nieces live close and visit frequently  Social Determinants of Health   Financial Resource Strain: Low Risk  (09/23/2022)   Overall Financial Resource Strain (CARDIA)    Difficulty of Paying Living Expenses: Not hard at all  Food Insecurity: No Food Insecurity (09/23/2022)   Hunger Vital Sign    Worried About Running Out of Food in the Last Year: Never true    Ran Out of Food in the Last Year: Never true  Transportation Needs: No Transportation Needs (09/23/2022)   PRAPARE - Administrator, Civil Service (Medical): No    Lack of Transportation (Non-Medical): No  Physical Activity: Sufficiently Active (09/23/2022)   Exercise Vital Sign    Days of  Exercise per Week: 5 days    Minutes of Exercise per Session: 30 min  Stress: No Stress Concern Present (09/23/2022)   [employer] of Occupational Health - Occupational Stress Questionnaire    Feeling of Stress : Not at all  Social Connections: Moderately Integrated (09/23/2022)   Social Connection and Isolation Panel [NHANES]    Frequency of Communication with Friends and Family: More than three times a week    Frequency of Social Gatherings with Friends and Family: Three times a week    Attends Religious Services: More than 4 times per year    Active Member of Clubs or Organizations: Yes    Attends Banker Meetings: More than 4 times per year    Marital Status: Never married  Intimate Partner Violence: Not At Risk (09/23/2022)   Humiliation, Afraid, Rape, and Kick questionnaire    Fear of Current or Ex-Partner: No    Emotionally Abused: No    Physically Abused: No    Sexually Abused: No   Past Surgical History:  Procedure Laterality Date   ABDOMINAL HYSTERECTOMY     APPENDECTOMY     with malignancy of appendix   CHOLECYSTECTOMY     COLON SURGERY     preventative - with appendix malignancy   HERNIA REPAIR     Family History  Problem Relation Age of Onset   Diabetes Mother    Hypertension Mother    Irregular heart beat Mother        pacemaker   Hearing loss Mother        right   Vision loss Mother        macular degeneration   Rheumatic fever Father        heart murmur   Pneumonia Father    Cancer - Cervical Sister    Heart disease Paternal Aunt    Cancer Paternal Aunt        tumor behind eye   Diabetes Maternal Grandmother    Vision loss Maternal Grandmother    Heart disease Paternal Grandfather    Breast cancer Neg Hx     Current Outpatient Medications:    aspirin 81 MG tablet, Take 81 mg by mouth daily., Disp: , Rfl:    azelastine (ASTELIN) 0.1 % nasal spray, Place 1 spray into both nostrils 2 (two) times daily., Disp: 30 mL, Rfl: 12    erythromycin ophthalmic ointment, Place 1 application. into the left eye at bedtime., Disp: 3.5 g, Rfl: 0   fluticasone (FLONASE) 50 MCG/ACT nasal spray, PLACE 2 SPRAYS INTO BOTH NOSTRILS EVERY DAY, Disp: 48 g, Rfl: 3   lisinopril (ZESTRIL) 20 MG tablet, Take 1 tablet (20 mg total) by mouth daily., Disp: 90 tablet, Rfl: 3   simvastatin (ZOCOR) 20 MG tablet, TAKE ONE TABLET BY MOUTH DAILY AT 6PM,  Disp: 90 tablet, Rfl: 3   Vitamin D, Ergocalciferol, (DRISDOL) 1.25 MG (50000 UNIT) CAPS capsule, TAKE ONE CAPSULE BY MOUTH every SEVEN DAYS, Disp: 12 capsule, Rfl: 3  Allergies  Allergen Reactions   Bee Venom     Local redness and swelling      ROS: Review of Systems Pertinent items noted in HPI and remainder of comprehensive ROS otherwise negative.    Physical exam BP 134/88   Pulse 60   Temp 98.5 F (36.9 C)   Ht 5\' 7"  (1.702 m)   Wt 236 lb (107 kg)   SpO2 97%   BMI 36.96 kg/m  General appearance: alert, cooperative, appears stated age, and morbidly obese Head: Normocephalic, without obvious abnormality, atraumatic Eyes: negative findings: lids and lashes normal, conjunctivae and sclerae normal, corneas clear, and pupils equal, round, reactive to light and accomodation Ears: normal TM's and external ear canals both ears Nose: Nares normal. Septum midline. Mucosa normal. No drainage or sinus tenderness. Throat: lips, mucosa, and tongue normal; teeth and gums normal Neck: no adenopathy, no carotid bruit, supple, symmetrical, trachea midline, and thyroid not enlarged, symmetric, no tenderness/mass/nodules Back: symmetric, no curvature. ROM normal. No CVA tenderness. Lungs: clear to auscultation bilaterally Heart: regular rate and rhythm, S1, S2 normal, no murmur, click, rub or gallop Abdomen: soft, non-tender; bowel sounds normal; no masses,  no organomegaly Extremities: venous stasis dermatitis noted and hemosiderin deposition noted in the right lower extremity greater than left lower  extremity.  No erythema, warmth or induration appreciated.  No skin breakdown or cracking/weeping Pulses: 2+ and symmetric Skin:  Skin changes as above Lymph nodes: Cervical, supraclavicular, and axillary nodes normal. Neurologic: Grossly normal    02/10/2023    9:52 AM 09/15/2018    4:12 PM 12/29/2016    8:49 AM  MMSE - Mini Mental State Exam  Orientation to time 5 5 5   Orientation to Place 5 5 5   Registration 3 3 3   Attention/ Calculation 5 5 5   Recall 3 3 3   Language- name 2 objects 2 2 2   Language- repeat 1 1 1   Language- follow 3 step command 3 3 3   Language- read & follow direction 1 1 1   Write a sentence 1 1 1   Copy design 1 1 1   Total score 30 30 30     Assessment/ Plan: Rhonda Daniel here for annual physical exam.   Annual physical exam  Morbid obesity (HCC) - Plan: CMP14+EGFR, TSH, T4, Free, Bayer DCA Hb A1c Waived  ESSENTIAL HYPERTENSION, BENIGN - Plan: CMP14+EGFR  Obstructive sleep apnea syndrome - Plan: CMP14+EGFR, CBC  Pure hypercholesterolemia - Plan: CMP14+EGFR, Lipid Panel, TSH, T4, Free  Encounter for hepatitis C screening test for low risk patient - Plan: Hepatitis C antibody  Impaired mobility and activities of daily living - Plan: Ambulatory referral to Physical Therapy, For home use only DME 4 wheeled rolling walker with seat ([id number]), Elevated toilet seat, Shower chair  Chronic pain of right knee - Plan: Ambulatory referral to Orthopedic Surgery, Ambulatory referral to Physical Therapy, For home use only DME 4 wheeled rolling walker with seat ([id number]), Elevated toilet seat, Shower chair  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  Blood pressure is controlled.  No changes.  Continue all medications as prescribed.  Orders for DME's placed and faxed to [facility].  Referral to Dr. Leavy Cella in [place] for that right knee placed.  Discussed  that as  soon as she sees him and they have a scheduled operative date to please contact our office so that I can do a preop clearance for her.  Will plan for EKG, labs etc. unless their office will arrange preop independently.  Compression hose for reduction in frequency of edema to the legs.  Hopefully this will reduce recurrent stasis dermatitis, injury to the vessels etc. no evidence of infection/cellulitis on exam today.  MMSE was totally normal today.  Suspect that memory changes that she was observed are likely normal aging.  No evidence of dementia  Counseled on healthy lifestyle choices, including diet (rich in fruits, vegetables and lean meats and low in salt and simple carbohydrates) and exercise (at least 30 minutes of moderate physical activity daily).  Patient to follow up in 4-27m for preop clearance  Orvel Cutsforth M. Nadine Counts, DO

## 2023-02-11 LAB — TSH: TSH: 1.61 u[IU]/mL (ref 0.450–4.500)

## 2023-02-11 LAB — CBC
Hematocrit: 45.1 % (ref 34.0–46.6)
Hemoglobin: 14.7 g/dL (ref 11.1–15.9)
MCH: 30.4 pg (ref 26.6–33.0)
MCHC: 32.6 g/dL (ref 31.5–35.7)
RBC: 4.83 x10E6/uL (ref 3.77–5.28)
RDW: 12.9 % (ref 11.7–15.4)
WBC: 5.8 10*3/uL (ref 3.4–10.8)

## 2023-02-11 LAB — CMP14+EGFR
ALT: 12 IU/L (ref 0–32)
AST: 16 IU/L (ref 0–40)
Albumin/Globulin Ratio: 1.8 (ref 1.2–2.2)
Albumin: 4.2 g/dL (ref 3.8–4.8)
Alkaline Phosphatase: 66 IU/L (ref 44–121)
BUN/Creatinine Ratio: 26 (ref 12–28)
BUN: 19 mg/dL (ref 8–27)
Bilirubin Total: 0.6 mg/dL (ref 0.0–1.2)
CO2: 24 mmol/L (ref 20–29)
Calcium: 9.2 mg/dL (ref 8.7–10.3)
Chloride: 104 mmol/L (ref 96–106)
Creatinine, Ser: 0.74 mg/dL (ref 0.57–1.00)
Globulin, Total: 2.4 g/dL (ref 1.5–4.5)
Glucose: 95 mg/dL (ref 70–99)
Potassium: 4.4 mmol/L (ref 3.5–5.2)
Sodium: 141 mmol/L (ref 134–144)
Total Protein: 6.6 g/dL (ref 6.0–8.5)
eGFR: 86 mL/min/{1.73_m2} (ref >59)

## 2023-02-11 LAB — LIPID PANEL
Chol/HDL Ratio: 2.6 ratio (ref 0.0–4.4)
Cholesterol, Total: 151 mg/dL (ref 100–199)
HDL: 58 mg/dL (ref >39)
LDL Chol Calc (NIH): 77 mg/dL (ref 0–99)
Triglycerides: 86 mg/dL (ref 0–149)
VLDL Cholesterol Cal: 16 mg/dL (ref 5–40)

## 2023-06-11 ENCOUNTER — Ambulatory Visit: Payer: No Typology Code available for payment source | Admitting: Family Medicine

## 2023-06-11 ENCOUNTER — Ambulatory Visit (INDEPENDENT_AMBULATORY_CARE_PROVIDER_SITE_OTHER): Payer: No Typology Code available for payment source | Admitting: Family Medicine

## 2023-06-11 ENCOUNTER — Encounter: Payer: Self-pay | Admitting: Family Medicine

## 2023-06-11 VITALS — BP 140/87 | HR 67 | Temp 98.6°F | Ht 67.0 in | Wt 234.5 lb

## 2023-06-11 DIAGNOSIS — I10 Essential (primary) hypertension: Secondary | ICD-10-CM

## 2023-06-11 DIAGNOSIS — J302 Other seasonal allergic rhinitis: Secondary | ICD-10-CM | POA: Diagnosis not present

## 2023-06-11 MED ORDER — LEVOCETIRIZINE DIHYDROCHLORIDE 5 MG PO TABS
5.0000 mg | ORAL_TABLET | Freq: Every evening | ORAL | 3 refills | Status: DC
Start: 2023-06-11 — End: 2023-09-10

## 2023-06-11 NOTE — Progress Notes (Signed)
   Acute Office Visit  Subjective:     Patient ID: Rhonda Daniel, female    DOB: September 16, 1950, 72 y.o.   MRN: [id number]  Chief Complaint  Patient presents with   Nasal Congestion    HPI Patient is in today for nasal congestion and rhinorrhea for the last month. This is daily and unchanged. This happens yearly in the fall for her. Flonase used to work for her but her insurance will no longer cover this and she wasn't sure if the OTC version would be as effective. She denies fever, chills, sore throat, ear pain, cough, shortness of breath, wheezing, or chest pain.   ROS As per HPI.      Objective:    BP (!) 140/87   Pulse 67   Temp 98.6 F (37 C) (Temporal)   Ht 5\' 7"  (1.702 m)   Wt 234 lb 8 oz (106.4 kg)   SpO2 98%   BMI 36.73 kg/m  BP Readings from Last 3 Encounters:  06/11/23 (!) 140/87  02/10/23 134/88  08/07/22 134/78      Physical Exam Vitals and nursing note reviewed.  Constitutional:      General: She is not in acute distress.    Appearance: She is not ill-appearing, toxic-appearing or diaphoretic.  HENT:     Right Ear: Tympanic membrane, ear canal and external ear normal.     Left Ear: Tympanic membrane, ear canal and external ear normal.     Nose: Congestion present.     Mouth/Throat:     Mouth: Mucous membranes are moist.     Pharynx: Oropharynx is clear. No oropharyngeal exudate or posterior oropharyngeal erythema.  Eyes:     General:        Right eye: No discharge.        Left eye: No discharge.     Conjunctiva/sclera: Conjunctivae normal.  Cardiovascular:     Rate and Rhythm: Normal rate and regular rhythm.     Heart sounds: Normal heart sounds. No murmur heard. Pulmonary:     Effort: Pulmonary effort is normal. No respiratory distress.     Breath sounds: Normal breath sounds.  Musculoskeletal:     Cervical back: Neck supple. No rigidity.     Right lower leg: No edema.     Left lower leg: No edema.  Skin:    General: Skin is warm and dry.   Neurological:     General: No focal deficit present.     Mental Status: She is alert and oriented to person, place, and time.  Psychiatric:        Mood and Affect: Mood normal.        Behavior: Behavior normal.     No results found for any visits on 06/11/23.      Assessment & Plan:   [place] was seen today for nasal congestion.  Diagnoses and all orders for this visit:  Seasonal allergic rhinitis, unspecified trigger Try xyzal as below. Discussed can use flonase OTC as well if no improvement. Return to office for new or worsening symptoms, or if symptoms persist.  -     levocetirizine (XYZAL) 5 MG tablet; Take 1 tablet (5 mg total) by mouth every evening.  Primary hypertension BP is mildly elevated today. Monitor at home and notify PCP for elevated readings.   The patient indicates understanding of these issues and agrees with the plan.  Gabriel Earing, FNP

## 2023-06-16 ENCOUNTER — Other Ambulatory Visit: Payer: Self-pay | Admitting: Family Medicine

## 2023-06-16 DIAGNOSIS — E559 Vitamin D deficiency, unspecified: Secondary | ICD-10-CM

## 2023-06-17 ENCOUNTER — Encounter: Payer: Self-pay | Admitting: Family Medicine

## 2023-06-17 ENCOUNTER — Other Ambulatory Visit: Payer: Self-pay | Admitting: Family Medicine

## 2023-06-17 ENCOUNTER — Ambulatory Visit: Payer: No Typology Code available for payment source | Admitting: Family Medicine

## 2023-06-17 VITALS — BP 131/83 | HR 69 | Temp 98.5°F | Ht 67.0 in | Wt 234.0 lb

## 2023-06-17 DIAGNOSIS — R413 Other amnesia: Secondary | ICD-10-CM | POA: Diagnosis not present

## 2023-06-17 DIAGNOSIS — I10 Essential (primary) hypertension: Secondary | ICD-10-CM | POA: Diagnosis not present

## 2023-06-17 DIAGNOSIS — E7849 Other hyperlipidemia: Secondary | ICD-10-CM

## 2023-06-17 DIAGNOSIS — Z01818 Encounter for other preprocedural examination: Secondary | ICD-10-CM | POA: Diagnosis not present

## 2023-06-17 LAB — COAGUCHEK XS/INR WAIVED
INR: 1 (ref 0.9–1.1)
Prothrombin Time: 12 s

## 2023-06-17 LAB — BAYER DCA HB A1C WAIVED: HB A1C (BAYER DCA - WAIVED): 4.9 % (ref 4.8–5.6)

## 2023-06-17 NOTE — Progress Notes (Signed)
Pt is a 72 y.o. female who is here for preoperative clearance for right knee surgery.  She also wants to talk about memory issues.  We talked about this in June and she had a 30 out of 30 MMSE but she notes that she just worries about the ongoing decline in memory.  Sometimes she does not even remember names.  This is intermittent.  She is wanting to go ahead and see a neurologist to further evaluate.  She also wants more information about advanced directives and healthcare power of attorney  1) High Risk Cardiac Conditions  1) Recent MI - No.  2) Decompensated Heart Failure - No.  3) Unstable angina - No.  4) Symptomatic arrythmia - No.  5) Sx Valvular Disease - No.  2) Intermediate Risk Factors - DM, CKD, CVA, CHF, CAD - No.  2) Functional Status - > 4 mets (Walk, run, climb stairs) Yes.    3) Surgery Specific Risk - Intermediate (Carotid, Head and Neck, Orthopaedic )         4) Further Noninvasive evaluation -   1) EKG - Yes.        2) Echo - No.   1) Worsening dyspnea   3) Stress Testing - Active Cardiac Disease - No.  5) Need for medical therapy - Beta Blocker, Statins indicated ? No.  PE: Vitals:   06/17/23 0942  BP: 131/83  Pulse: 69  Temp: 98.5 F (36.9 C)  SpO2: 100%   Physical Examination: General appearance - alert, well appearing, and in no distress and morbidly obese Mental status - alert, oriented to person, place, and time Eyes - pupils equal and reactive, extraocular eye movements intact Nose - normal and patent, no erythema, discharge or polyps Mouth - mucous membranes moist, pharynx normal without lesions Neck - supple, no significant adenopathy, able to extend fully Chest - clear to auscultation, no wheezes, rales or rhonchi, symmetric air entry Heart - normal rate, regular rhythm, normal S1, S2, no murmurs, rubs, clicks or gallops Musculoskeletal - arthritic changes appreciated to knees. Ambulating independently.   Preop examination - Plan: EKG 12-Lead,  CMP14+EGFR, CBC, Bayer DCA Hb A1c Waived, CoaguChek XS/INR Waived, Urinalysis  Morbid obesity (HCC) - Plan: EKG 12-Lead, CMP14+EGFR, Bayer DCA Hb A1c Waived, Urinalysis  Primary hypertension - Plan: EKG 12-Lead, CMP14+EGFR, Urinalysis  Memory change - Plan: Ambulatory referral to Neurology   I have independently evaluated patient.  Rhonda Daniel is a 72 y.o. female who is low risk for a intermediate risk surgery.  There are modifiable risk factors and this specifically includes her weight.  We discussed need for weight loss.  She is actively working on lifestyle modification Rhonda Daniel's RCRI calculation for MACE is: 0.  I personally reviewed EKG and this demonstrated no evidence of ischemia  Blood pressure well-controlled.  Refer to neurology for memory changes.  I question possible unrecognized obstructive sleep apnea given BMI and reports of intermittent memory issues.  Her MMSE back in June was normal.  I did give her information on healthcare power of attorney and advanced directives today.  She was able to add her niece and brother to her DPR.  Would be glad to have a conversation with them should they desire    Less [place] M. Nadine Counts, DO [facility]

## 2023-06-18 LAB — CMP14+EGFR
ALT: 16 [IU]/L (ref 0–32)
AST: 21 [IU]/L (ref 0–40)
Albumin: 4.3 g/dL (ref 3.8–4.8)
Alkaline Phosphatase: 75 [IU]/L (ref 44–121)
BUN/Creatinine Ratio: 22 (ref 12–28)
BUN: 15 mg/dL (ref 8–27)
Bilirubin Total: 0.5 mg/dL (ref 0.0–1.2)
CO2: 24 mmol/L (ref 20–29)
Calcium: 9.6 mg/dL (ref 8.7–10.3)
Chloride: 105 mmol/L (ref 96–106)
Creatinine, Ser: 0.68 mg/dL (ref 0.57–1.00)
Globulin, Total: 2.4 g/dL (ref 1.5–4.5)
Glucose: 97 mg/dL (ref 70–99)
Potassium: 4.5 mmol/L (ref 3.5–5.2)
Sodium: 144 mmol/L (ref 134–144)
Total Protein: 6.7 g/dL (ref 6.0–8.5)
eGFR: 92 mL/min/{1.73_m2} (ref >59)

## 2023-06-18 LAB — CBC
Hematocrit: 45.2 % (ref 34.0–46.6)
Hemoglobin: 14.4 g/dL (ref 11.1–15.9)
MCH: 30.4 pg (ref 26.6–33.0)
MCHC: 31.9 g/dL (ref 31.5–35.7)
MCV: 96 fL (ref 79–97)
Platelets: 236 10*3/uL (ref 150–450)
RBC: 4.73 x10E6/uL (ref 3.77–5.28)
RDW: 12.1 % (ref 11.7–15.4)
WBC: 5.3 10*3/uL (ref 3.4–10.8)

## 2023-06-21 ENCOUNTER — Other Ambulatory Visit: Payer: No Typology Code available for payment source

## 2023-06-21 DIAGNOSIS — I10 Essential (primary) hypertension: Secondary | ICD-10-CM | POA: Diagnosis not present

## 2023-06-21 DIAGNOSIS — Z01818 Encounter for other preprocedural examination: Secondary | ICD-10-CM | POA: Diagnosis not present

## 2023-06-21 LAB — URINALYSIS
Bilirubin, UA: NEGATIVE
Glucose, UA: NEGATIVE
Leukocytes,UA: NEGATIVE
Nitrite, UA: NEGATIVE
Protein,UA: NEGATIVE
RBC, UA: NEGATIVE
Specific Gravity, UA: 1.02 (ref 1.005–1.030)
Urobilinogen, Ur: 0.2 mg/dL (ref 0.2–1.0)
pH, UA: 6 (ref 5.0–7.5)

## 2023-07-20 ENCOUNTER — Telehealth: Payer: Self-pay | Admitting: Family Medicine

## 2023-07-20 NOTE — Telephone Encounter (Signed)
Copied from CRM [id number]. Topic: Clinical - Medication Refill >> Jul 20, 2023  3:58 PM Almira Coaster wrote: Most Recent Primary Care Visit:  Provider: [facility]  Department: [facility]  Visit Type: LAB  Date: 06/21/2023  Medication: Anxiety Medication, she's never taken anxiety medication but has been feeling anxious lately, she did not want to see another provider and the only appointment available was Jan 31.   Has the patient contacted their pharmacy? Yes (Agent: If no, request that the patient contact the pharmacy for the refill. If patient does not wish to contact the pharmacy document the reason why and proceed with request.) (Agent: If yes, when and what did the pharmacy advise?)  Is this the correct pharmacy for this prescription? Yes If no, delete pharmacy and type the correct one.  This is the patient's preferred pharmacy:  [facility] [place], [place] - [address] [place] [address] Phone: [phone] Fax: [phone]  [id number] - [place], [place] - [address] [place] [address] Phone: [phone] Fax: [phone]   Has the prescription been filled recently? No  Is the patient out of the medication? No  Has the patient been seen for an appointment in the last year OR does the patient have an upcoming appointment? Yes  Can we respond through MyChart? No  Agent: Please be advised that Rx refills may take up to 3 business days. We ask that you follow-up with your pharmacy.

## 2023-07-20 NOTE — Telephone Encounter (Signed)
Caryn Bee called from [facility] to make [place] aware that pt had called requesting to schedule an appt with PCP for some off and on anxiety. Caryn Bee offered pt PCPs first available, which is 10/08/2023. Pt declined to take that appt because it wasn't soon enough. Caryn Bee then offered to schedule pt to see another provider much sooner. Pt declined to do that too and said that she was just going to come to the office tomorrow to get an appt with PCP.  I called patient and explained to her that Dr Nadine Counts is on vacation and wont be back until the 19th and also that what Caryn Bee told her about her first available being on 10/08/2023 is true so if she needed to be seen sooner, then we could get her scheduled with a different provider much sooner. Pt declined to see anyone else. Pt made an appt to see PCP on 10/08/2023 and I told her that I would add her to the cancellation list, so if anything opened up before her appt, we may be able to call her to offer her something sooner. I also explained that if she changed her mind about seeing another provider, to call us, and we could get her scheduled with someone sooner. Pt voiced understanding.

## 2023-07-26 ENCOUNTER — Encounter: Payer: Self-pay | Admitting: Family Medicine

## 2023-07-26 ENCOUNTER — Ambulatory Visit (INDEPENDENT_AMBULATORY_CARE_PROVIDER_SITE_OTHER): Payer: No Typology Code available for payment source | Admitting: Family Medicine

## 2023-07-26 VITALS — BP 122/72 | HR 67 | Temp 98.1°F | Ht 67.0 in | Wt 233.1 lb

## 2023-07-26 DIAGNOSIS — R413 Other amnesia: Secondary | ICD-10-CM

## 2023-07-26 DIAGNOSIS — F320 Major depressive disorder, single episode, mild: Secondary | ICD-10-CM | POA: Diagnosis not present

## 2023-07-26 DIAGNOSIS — G8929 Other chronic pain: Secondary | ICD-10-CM

## 2023-07-26 DIAGNOSIS — M25561 Pain in right knee: Secondary | ICD-10-CM

## 2023-07-26 DIAGNOSIS — F411 Generalized anxiety disorder: Secondary | ICD-10-CM | POA: Diagnosis not present

## 2023-07-26 MED ORDER — SERTRALINE HCL 25 MG PO TABS
25.0000 mg | ORAL_TABLET | Freq: Every day | ORAL | 0 refills | Status: DC
Start: 2023-07-26 — End: 2023-08-24

## 2023-07-26 NOTE — Patient Instructions (Signed)

## 2023-07-26 NOTE — Progress Notes (Signed)
Acute Office Visit  Subjective:     Patient ID: Rhonda Daniel, female    DOB: 07/15/1951, 72 y.o.   MRN: [id number]  Chief Complaint  Patient presents with   Anxiety   Depression    HPI Here with her friend Susie today. Patient is in today for anxiety. Reports anxiety symptoms have been increasing since this spring, shortly after the death of her cat of 22 years. She has found herself worried about everything: home safety, financial worries, how she is dressed, what others think of her etc. She feels fretful and cries easily. Had a episode last week while getting her hair done where she felt suddenly overwhelmed and too afraid to drive. She had to call a family member to pick her up and bring her home. She also had noticed some difficulty with her memory. For instance, she was listening to the news about gas prices and forgot where she buys her gas even though she has been getting gas from the same store for many years. She has been referred to neurology for memory changes. She has an appt for consult in February.   Also would like a referral to ortho for her chronic right knee pain. Hx of arthritis in her right knee. Last imaging in 2021. Reports pain has been worsening. Occurs daily.       07/26/2023   11:18 AM 06/17/2023    9:43 AM 02/10/2023    8:59 AM  Depression screen PHQ 2/9  Decreased Interest 0 0 0  Down, Depressed, Hopeless 3 0 0  PHQ - 2 Score 3 0 0  Altered sleeping 0 0 0  Tired, decreased energy 0 0 0  Change in appetite 0 0 0  Feeling bad or failure about yourself  3 0 0  Trouble concentrating 3 0 0  Moving slowly or fidgety/restless 0 0 0  Suicidal thoughts 0 0 0  PHQ-9 Score 9 0 0  Difficult doing work/chores Extremely dIfficult  Not difficult at all      07/26/2023   11:17 AM 06/17/2023    9:43 AM 02/10/2023    8:59 AM 08/07/2022    8:44 AM  GAD 7 : Generalized Anxiety Score  Nervous, Anxious, on Edge 3 0 0 0  Control/stop worrying 3 0 0 0  Worry too much  - different things 3 0 0 0  Trouble relaxing 3 0 0 0  Restless 2 0 0 0  Easily annoyed or irritable 2 0 0 0  Afraid - awful might happen 3 0 0 0  Total GAD 7 Score 19 0 0 0  Anxiety Difficulty Extremely difficult Not difficult at all Not difficult at all Not difficult at all      07/26/2023   11:41 AM 02/10/2023    9:52 AM 09/15/2018    4:12 PM  MMSE - Mini Mental State Exam  Orientation to time 5 5 5   Orientation to Place 5 5 5   Registration 3 3 3   Attention/ Calculation 5 5 5   Recall 0 3 3  Language- name 2 objects 2 2 2   Language- repeat 0 1 1  Language- follow 3 step command 3 3 3   Language- read & follow direction 1 1 1   Write a sentence 1 1 1   Copy design 1 1 1   Total score 26 30 30      ROS As per HPI.      Objective:    BP 122/72   Pulse 67  Temp 98.1 F (36.7 C) (Temporal)   Ht 5\' 7"  (1.702 m)   Wt 233 lb 2 oz (105.7 kg)   SpO2 98%   BMI 36.51 kg/m    Physical Exam Vitals and nursing note reviewed.  Constitutional:      General: She is not in acute distress.    Appearance: She is not ill-appearing, toxic-appearing or diaphoretic.  Cardiovascular:     Rate and Rhythm: Regular rhythm.     Heart sounds: Normal heart sounds. No murmur heard. Pulmonary:     Effort: Pulmonary effort is normal.     Breath sounds: Normal breath sounds.  Musculoskeletal:     Cervical back: Neck supple. No rigidity.     Right lower leg: No edema.     Left lower leg: No edema.  Skin:    General: Skin is warm and dry.  Neurological:     Mental Status: She is alert and oriented to person, place, and time. Mental status is at baseline.     Gait: Gait abnormal (using cane).  Psychiatric:        Attention and Perception: Attention normal.        Mood and Affect: Mood normal.        Speech: Speech normal.        Behavior: Behavior normal.        Thought Content: Thought content normal.        Cognition and Memory: Cognition normal.     No results found for any visits on  07/26/23.      Assessment & Plan:   Rhonda Daniel was seen today for anxiety and depression.  Diagnoses and all orders for this visit:  Generalized anxiety disorder Depression, major, single episode, mild (HCC) Significant anxiety symptoms. Mild depression symptoms- denies SI. Start zoloft as below. Keep scheduled follow up with PCP.  -     sertraline (ZOLOFT) 25 MG tablet; Take 1 tablet (25 mg total) by mouth daily.  Memory change MMSE normal with score of 26 today. Previous score of 30. Has appt with neurology upcoming for consult regarding this.   Chronic pain of right knee Referral placed as requested.  -     Ambulatory referral to Orthopedic Surgery   Keep schedule appt with PCP, sooner for new or worsening symptoms.   The patient indicates understanding of these issues and agrees with the plan.  Gabriel Earing, FNP

## 2023-07-27 ENCOUNTER — Ambulatory Visit (INDEPENDENT_AMBULATORY_CARE_PROVIDER_SITE_OTHER): Payer: No Typology Code available for payment source | Admitting: Family Medicine

## 2023-07-27 ENCOUNTER — Encounter: Payer: Self-pay | Admitting: Family Medicine

## 2023-07-27 VITALS — BP 132/83 | HR 66 | Temp 98.6°F | Ht 67.0 in | Wt 231.0 lb

## 2023-07-27 DIAGNOSIS — F411 Generalized anxiety disorder: Secondary | ICD-10-CM | POA: Diagnosis not present

## 2023-07-27 DIAGNOSIS — F410 Panic disorder [episodic paroxysmal anxiety] without agoraphobia: Secondary | ICD-10-CM | POA: Insufficient documentation

## 2023-07-27 NOTE — Addendum Note (Signed)
Addended by: Raliegh Ip on: 07/27/2023 11:24 AM   Modules accepted: Level of Service

## 2023-07-27 NOTE — Progress Notes (Addendum)
Subjective: XB:[id number] PCP: Raliegh Ip, DO AOZ:HYQMV E Filter is a 72 y.o. female presenting to clinic today for:  1. Anxiety Started on Zoloft 25mg  yesterday.  She is accompanied today by her sister-in-law, who reports they have seen gradually increasing anxiety and excessive worry over the last several months.  This seemed to precipitate after the patient lost her cat in February.  Since then she started taking care of a new stray cat, who resides outside.  They have noticed panic attacks, 1 of which occurred recently while she was at the hairdresser.  She required somebody to come retrieve her because she did not feel safe to drive during that episode.  She has been having more worry about things like finances, the cat, and other things that she normally would not worry about.  She reports concentration has been difficult and she continues to have some lag in memory.  Has an appointment with neurology soon for this.  She notes that she tolerated the Zoloft without difficulty x 2 so far.  No reports of GI side effects and in fact she reports that she rested quite comfortably last night after taking it.  She is optimistic about how this will work.  Her sister-in-law inquires as to whether a benzodiazepine may be appropriate for her.  They worry about her being alone and having a panic attack at home   ROS: Per HPI  Allergies  Allergen Reactions   Bee Venom     Local redness and swelling    Past Medical History:  Diagnosis Date   Anxiety    Hyperlipidemia    Hypertension    Sleep apnea    Was on CPAP.   Does not use now.     Current Outpatient Medications:    fluticasone (FLONASE) 50 MCG/ACT nasal spray, PLACE 2 SPRAYS INTO BOTH NOSTRILS EVERY DAY, Disp: 48 g, Rfl: 3   levocetirizine (XYZAL) 5 MG tablet, Take 1 tablet (5 mg total) by mouth every evening., Disp: 90 tablet, Rfl: 3   lisinopril (ZESTRIL) 20 MG tablet, Take 1 tablet (20 mg total) by mouth daily., Disp: 90 tablet,  Rfl: 3   sertraline (ZOLOFT) 25 MG tablet, Take 1 tablet (25 mg total) by mouth daily., Disp: 90 tablet, Rfl: 0   simvastatin (ZOCOR) 20 MG tablet, TAKE ONE TABLET DAILY AT 6PM, Disp: 90 tablet, Rfl: 0   Vitamin D, Ergocalciferol, (DRISDOL) 1.25 MG (50000 UNIT) CAPS capsule, TAKE ONE CAPSULE BY MOUTH every SEVEN DAYS, Disp: 12 capsule, Rfl: 0 Social History   Socioeconomic History   Marital status: Single    Spouse name: Not on file   Number of children: 0   Years of education: 16   Highest education level: Master's degree (e.g., MA, MS, MEng, MEd, MSW, MBA)  Occupational History   Occupation: Retired     Comment: Advice worker   Tobacco Use   Smoking status: Never   Smokeless tobacco: Never  Vaping Use   Vaping status: Never Used  Substance and Sexual Activity   Alcohol use: No   Drug use: No   Sexual activity: Not Currently    Birth control/protection: Surgical  Other Topics Concern   Not on file  Social History Narrative   Lives with patches (cat) with diabetes.    Took care of her mother until Aug 07, 2018 when she passed away, her father passed away when she was about 21 - so she retired early to care for him   Her brother  and 2 nieces live close and visit frequently   Social Determinants of Health   Financial Resource Strain: Low Risk  (09/23/2022)   Overall Financial Resource Strain (CARDIA)    Difficulty of Paying Living Expenses: Not hard at all  Food Insecurity: No Food Insecurity (09/23/2022)   Hunger Vital Sign    Worried About Running Out of Food in the Last Year: Never true    Ran Out of Food in the Last Year: Never true  Transportation Needs: No Transportation Needs (09/23/2022)   PRAPARE - Administrator, Civil Service (Medical): No    Lack of Transportation (Non-Medical): No  Physical Activity: Sufficiently Active (09/23/2022)   Exercise Vital Sign    Days of Exercise per Week: 5 days    Minutes of Exercise per Session: 30 min  Stress: No Stress  Concern Present (09/23/2022)   [employer] of Occupational Health - Occupational Stress Questionnaire    Feeling of Stress : Not at all  Social Connections: Moderately Integrated (09/23/2022)   Social Connection and Isolation Panel [NHANES]    Frequency of Communication with Friends and Family: More than three times a week    Frequency of Social Gatherings with Friends and Family: Three times a week    Attends Religious Services: More than 4 times per year    Active Member of Clubs or Organizations: Yes    Attends Banker Meetings: More than 4 times per year    Marital Status: Never married  Intimate Partner Violence: Not At Risk (09/23/2022)   Humiliation, Afraid, Rape, and Kick questionnaire    Fear of Current or Ex-Partner: No    Emotionally Abused: No    Physically Abused: No    Sexually Abused: No   Family History  Problem Relation Age of Onset   Diabetes Mother    Hypertension Mother    Irregular heart beat Mother        pacemaker   Hearing loss Mother        right   Vision loss Mother        macular degeneration   Rheumatic fever Father        heart murmur   Pneumonia Father    Cancer - Cervical Sister    Heart disease Paternal Aunt    Cancer Paternal Aunt        tumor behind eye   Diabetes Maternal Grandmother    Vision loss Maternal Grandmother    Heart disease Paternal Grandfather    Breast cancer Neg Hx     Objective: Office vital signs reviewed. BP 132/83   Pulse 66   Temp 98.6 F (37 C)   Ht 5\' 7"  (1.702 m)   Wt 231 lb (104.8 kg)   SpO2 95%   BMI 36.18 kg/m   Physical Examination:  General: Awake, alert, well nourished, No acute distress Psych: Very pleasant, interactive.  Mood is stable.  Good eye contact.  Does not appear to be responding to internal stimuli     07/27/2023    8:36 AM 07/26/2023   11:18 AM 06/17/2023    9:43 AM  Depression screen PHQ 2/9  Decreased Interest 1 0 0  Down, Depressed, Hopeless 3 3 0  PHQ - 2  Score 4 3 0  Altered sleeping 0 0 0  Tired, decreased energy 1 0 0  Change in appetite 0 0 0  Feeling bad or failure about yourself  3 3 0  Trouble concentrating 3 3  0  Moving slowly or fidgety/restless 3 0 0  Suicidal thoughts 0 0 0  PHQ-9 Score 14 9 0  Difficult doing work/chores Somewhat difficult Extremely dIfficult       07/27/2023    8:37 AM 07/26/2023   11:17 AM 06/17/2023    9:43 AM 02/10/2023    8:59 AM  GAD 7 : Generalized Anxiety Score  Nervous, Anxious, on Edge 3 3 0 0  Control/stop worrying 3 3 0 0  Worry too much - different things 3 3 0 0  Trouble relaxing 3 3 0 0  Restless 0 2 0 0  Easily annoyed or irritable 2 2 0 0  Afraid - awful might happen 3 3 0 0  Total GAD 7 Score 17 19 0 0  Anxiety Difficulty Somewhat difficult Extremely difficult Not difficult at all Not difficult at all    Assessment/ Plan: 72 y.o. female   Generalized anxiety disorder with panic attacks  Agree with Zoloft.  We discussed the risks that benzodiazepines pose, particularly given her impaired mobility and increased risk for falls.  We also discussed impact on mentation including association with Alzheimer's dementia.  At this time I do not think a benzodiazepine is appropriate for her and I have recommended that she give the SSRI some time to work.  It sounds like she is already having a positive impact from medication.  I am going to reassess her again in 4 weeks, sooner if concerns arise   Raliegh Ip, DO [facility] [phone]

## 2023-07-30 ENCOUNTER — Telehealth: Payer: Self-pay | Admitting: *Deleted

## 2023-08-02 ENCOUNTER — Telehealth: Payer: Self-pay | Admitting: Family Medicine

## 2023-08-02 ENCOUNTER — Ambulatory Visit: Payer: Self-pay | Admitting: Family Medicine

## 2023-08-02 ENCOUNTER — Telehealth: Payer: Self-pay

## 2023-08-02 ENCOUNTER — Other Ambulatory Visit: Payer: Self-pay | Admitting: Family Medicine

## 2023-08-02 DIAGNOSIS — F410 Panic disorder [episodic paroxysmal anxiety] without agoraphobia: Secondary | ICD-10-CM

## 2023-08-02 MED ORDER — BUSPIRONE HCL 5 MG PO TABS: 5.0000 mg | ORAL_TABLET | Freq: Two times a day (BID) | ORAL | 0 refills | Status: DC

## 2023-08-02 NOTE — Telephone Encounter (Signed)
Left detailed vm from pcp   Advised her if she had any additional questions to call back

## 2023-08-02 NOTE — Telephone Encounter (Signed)
Handled in another encounter

## 2023-08-02 NOTE — Progress Notes (Signed)
She has only been on med for 6 days. We discussed this could take weeks before it becomes effective.  She has not given med enough time to work yet.  If she is becoming suicidal or cannot tolerate med, that would be a reason to discontinue.  Otherwise, recommend using meds as we discussed.  I've added buspar to use if needed for breakthrough anxiety/ panic attacks.

## 2023-08-02 NOTE — Telephone Encounter (Signed)
Handled in another counter

## 2023-08-02 NOTE — Telephone Encounter (Signed)
Copied from CRM [id number]. Topic: Clinical - Red Word Triage >> Aug 02, 2023 10:50 AM Eunice Blase wrote: Red Word that prompted transfer to Nurse Triage: Pt's friend Ms. Spear HIPAA stated that pt's anxiety is more intense. Warm transferred to Triage Nurse.    Chief Complaint: Anxiety Symptoms: Intense fear of driving to certain locations and going outside Frequency: Anxiety for "sometime" now, but intense fear started over the weekend.  Pertinent Negatives: Patient denies thoughts of self harm Disposition: [] ED /[] Urgent Care (no appt availability in office) / [] Appointment(In office/virtual)/ []  [facility]/ [] Home Care/ [] Refused Recommended Disposition /[] [facility]/ [x]  Follow-up with PCP Additional Notes: Per patient's friend/caregiver, patient has been having intense fears of going outside and driving to certain areas, she is able to "talk her down" but patient was adamant about not driving. Pt recently started on Sertraline and was advised to call back if there were any issues. Next aval with Dr. Nadine Counts is 01/15, caregiver and patient prefer to see her sooner. Call placed to [facility], spoke with Misty to possibly get an earlier visit. Dr. Nadine Counts nurse to call patient back directly.    Reason for Disposition  [1] Started on anti-anxiety medication AND [2] no relief  Answer Assessment - Initial Assessment Questions 1. CONCERN: "Did anything happen that prompted you to call today?"      Worsening anxiety and phobia feelings about driving  2. ANXIETY SYMPTOMS: "Can you describe how you (your loved one; patient) have been feeling?" (e.g., tense, restless, panicky, anxious, keyed up, overwhelmed, sense of impending doom).      Developed a phobia feeling about driving car to the same place where she had her first panic attack. Also afraid of going outside and seeing cat that she started care of. Has stated to family that "I feel like my nerves are shot"  3. ONSET:  "How long have you been feeling this way?" (e.g., hours, days, weeks)     Started over the weekend, is on her 7th or 8th dose of Sertraline.  4. SEVERITY: "How would you rate the level of anxiety?" (e.g., 0 - 10; or mild, moderate, severe).     10, seems pretty severe  5. FUNCTIONAL IMPAIRMENT: "How have these feelings affected your ability to do daily activities?" "Have you had more difficulty than usual doing your normal daily activities?" (e.g., getting better, same, worse; self-care, school, work, interactions)     Yes, the anxiety is started to affect her driving. She is not having trouble doing self care, she just stays home  6. HISTORY: "Have you felt this way before?" "Have you ever been diagnosed with an anxiety problem in the past?" (e.g., generalized anxiety disorder, panic attacks, PTSD). If Yes, ask: "How was this problem treated?" (e.g., medicines, counseling, etc.)     Has history of anxiety, currently sertraline for 7 days now  7. RISK OF HARM - SUICIDAL IDEATION: "Do you ever have thoughts of hurting or killing yourself?" If Yes, ask:  "Do you have these feelings now?" "Do you have a plan on how you would do this?"     Has not expressed any thoughts of self harm  8. TREATMENT:  "What has been done so far to treat this anxiety?" (e.g., medicines, relaxation strategies). "What has helped?"     Seemed to be doing fine sertraline up until this weekend.   9. TREATMENT - THERAPIST: "Do you have a counselor or therapist? Name?"     No, but  would benefit from one.   10. POTENTIAL TRIGGERS: "Do you drink caffeinated beverages (e.g., coffee, colas, teas), and how much daily?" "Do you drink alcohol or use any drugs?" "Have you started any new medicines recently?"       "No known triggers"  11. PATIENT SUPPORT: "Who is with you now?" "Who do you live with?" "Do you have family or friends who you can talk to?"        Lives alone, but has family and friends support  21. OTHER SYMPTOMS:  "Do you have any other symptoms?" (e.g., feeling depressed, trouble concentrating, trouble sleeping, trouble breathing, palpitations or fast heartbeat, chest pain, sweating, nausea, or diarrhea)       No other symptoms known at this time.   13. PREGNANCY: "Is there any chance you are pregnant?" "When was your last menstrual period?"       NO  Protocols used: Anxiety and Panic Attack-A-AH

## 2023-08-03 ENCOUNTER — Ambulatory Visit: Payer: Self-pay | Admitting: Family Medicine

## 2023-08-03 ENCOUNTER — Telehealth: Payer: No Typology Code available for payment source

## 2023-08-03 NOTE — Telephone Encounter (Signed)
  Chief Complaint: I would like to speak to my doctor. Symptoms: anxious Pertinent Negatives: Patient denies SI/HI, denies wanting to speak to triage nurse. Pt denies any s/s at this time.  Disposition: [] ED /[] Urgent Care (no appt availability in office) / [x] Appointment(In office/virtual)/ []  [facility]/ [] Home Care/ [] Refused Recommended Disposition /[] [place] [facility]/ []  Follow-up with PCP Additional Notes: Pt requesting to speak with doctor. Does not want triage.  Pt scheduled tomorrow. Advised to call back should she decide to speak to nurse, agreeable   Copied from CRM [id number]. Topic: Clinical - Medication Refill >> Aug 03, 2023 12:21 PM Louie Casa B wrote: Most Recent Primary Care Visit:  Provider: Raliegh Ip  Department: [facility]  Visit Type: ACUTE  Date: 07/27/2023  Medication: sertaline 25 mg pt has concerns and questions about this medication  Has the patient contacted their pharmacy? Yes (Agent: If no, request that the patient contact the pharmacy for the refill. If patient does not wish to contact the pharmacy document the reason why and proceed with request.) (Agent: If yes, when and what did the pharmacy advise?)  Is this the correct pharmacy for this prescription? Yes If no, delete pharmacy and type the correct one.  This is the patient's preferred pharmacy:  [facility] [place], [place] - [address] [place] [address] Phone: [phone] Fax: [phone]  [id number] - [place], [place] - [address] [place] [address] Phone: [phone] Fax: [phone]   Has the prescription been filled recently? Yes  Is the patient out of the medication? no  Has the patient been seen for an appointment in the last year OR does the patient have an upcoming appointment? No  Can we respond through MyChart? No  Agent: Please be advised that Rx refills may  take up to 3 business days. We ask that you follow-up with your pharmacy. Reason for Disposition  Caller requesting an appointment, triage offered and declined  Answer Assessment - Initial Assessment Questions 1. REASON FOR CALL or QUESTION: "What is your reason for calling today?" or "How can I best help you?" or "What question do you have that I can help answer?"     No question for triage nurse, I would like to talk to my doctor 2. CALLER: Document the source of call. (e.g., laboratory, patient).     pt  Protocols used: PCP Call - No Triage-A-AH

## 2023-08-04 ENCOUNTER — Telehealth: Payer: Self-pay

## 2023-08-04 ENCOUNTER — Telehealth (INDEPENDENT_AMBULATORY_CARE_PROVIDER_SITE_OTHER): Payer: No Typology Code available for payment source | Admitting: Family Medicine

## 2023-08-04 NOTE — Progress Notes (Signed)
Did not need appt.  She is going to get her Buspar.  She forgot it was available to her. Still having some memory issues. Seeing neurology soon to eval.  Reassurance. Follow up as scheduled.

## 2023-08-04 NOTE — Telephone Encounter (Signed)
Called and spoke with pt , she states she did not pick up her Margaretha Seeds she didn't know she was supposed to start something new , states she is calling the drug store now and going to go get that medication   I advised her she can take that up to 2 times a day as needed for her panic attacks    She states she got worked up yesturday at home and hd a panic attack , states she took in a new kitten and it has been driving her crazy and that's what set it off states she is much better today but still would like to talk to you a few minutes about everything   Confirmed with her it was supposed to be set up as a mychart visit , changed the apt

## 2023-08-24 ENCOUNTER — Ambulatory Visit (INDEPENDENT_AMBULATORY_CARE_PROVIDER_SITE_OTHER): Payer: No Typology Code available for payment source | Admitting: Family Medicine

## 2023-08-24 ENCOUNTER — Encounter: Payer: Self-pay | Admitting: Family Medicine

## 2023-08-24 VITALS — BP 133/72 | HR 58 | Temp 98.7°F | Ht 67.0 in | Wt 227.0 lb

## 2023-08-24 DIAGNOSIS — R413 Other amnesia: Secondary | ICD-10-CM | POA: Diagnosis not present

## 2023-08-24 DIAGNOSIS — F410 Panic disorder [episodic paroxysmal anxiety] without agoraphobia: Secondary | ICD-10-CM | POA: Diagnosis not present

## 2023-08-24 DIAGNOSIS — F320 Major depressive disorder, single episode, mild: Secondary | ICD-10-CM | POA: Diagnosis not present

## 2023-08-24 DIAGNOSIS — F411 Generalized anxiety disorder: Secondary | ICD-10-CM | POA: Diagnosis not present

## 2023-08-24 MED ORDER — BUSPIRONE HCL 7.5 MG PO TABS: 7.5000 mg | ORAL_TABLET | Freq: Three times a day (TID) | ORAL | 0 refills | Status: DC

## 2023-08-24 MED ORDER — SERTRALINE HCL 50 MG PO TABS: 50.0000 mg | ORAL_TABLET | Freq: Every day | ORAL | 0 refills | Status: DC

## 2023-08-24 NOTE — Progress Notes (Addendum)
Subjective: CC: mood PCP: Raliegh Ip, DO WUJ:[id number] Rhonda Daniel is a 72 y.o. female presenting to clinic today for:  1. Anxiety/ Depression Started on SSRI and Buspar at last visit.  She is accompanied today by her family friend.  They report since last visit she has had several episodes of catastrophic sizing small, insignificant things.  She has had worsening anxiety surrounding driving but has driven several times.  The cat that was feral and is now removed from the home and this has provided a lot of relief for her.  She does report improvement with the Zoloft and BuSpar but thinks perhaps the higher dose may be necessary she is asking for referral to counseling services today as well   ROS: Per HPI  Allergies  Allergen Reactions   Bee Venom     Local redness and swelling    Past Medical History:  Diagnosis Date   Anxiety    Hyperlipidemia    Hypertension    Sleep apnea    Was on CPAP.   Does not use now.     Current Outpatient Medications:    busPIRone (BUSPAR) 5 MG tablet, Take 1 tablet (5 mg total) by mouth 2 (two) times daily. For anxiety/ panic, Disp: 180 tablet, Rfl: 0   fluticasone (FLONASE) 50 MCG/ACT nasal spray, PLACE 2 SPRAYS INTO BOTH NOSTRILS EVERY DAY, Disp: 48 g, Rfl: 3   levocetirizine (XYZAL) 5 MG tablet, Take 1 tablet (5 mg total) by mouth every evening., Disp: 90 tablet, Rfl: 3   lisinopril (ZESTRIL) 20 MG tablet, Take 1 tablet (20 mg total) by mouth daily., Disp: 90 tablet, Rfl: 3   sertraline (ZOLOFT) 25 MG tablet, Take 1 tablet (25 mg total) by mouth daily., Disp: 90 tablet, Rfl: 0   simvastatin (ZOCOR) 20 MG tablet, TAKE ONE TABLET DAILY AT 6PM, Disp: 90 tablet, Rfl: 0   Vitamin D, Ergocalciferol, (DRISDOL) 1.25 MG (50000 UNIT) CAPS capsule, TAKE ONE CAPSULE BY MOUTH every SEVEN DAYS, Disp: 12 capsule, Rfl: 0 Social History   Socioeconomic History   Marital status: Single    Spouse name: Not on file   Number of children: 0   Years of education:  16   Highest education level: Master's degree (Rhonda.g., MA, MS, MEng, MEd, MSW, MBA)  Occupational History   Occupation: Retired     Comment: Advice worker   Tobacco Use   Smoking status: Never   Smokeless tobacco: Never  Vaping Use   Vaping status: Never Used  Substance and Sexual Activity   Alcohol use: No   Drug use: No   Sexual activity: Not Currently    Birth control/protection: Surgical  Other Topics Concern   Not on file  Social History Narrative   Lives with patches (cat) with diabetes.    Took care of her mother until Sep 20, 2018 when she passed away, her father passed away when she was about 46 - so she retired early to care for him   Her brother and 2 nieces live close and visit frequently   Social Drivers of Corporate investment banker Strain: Low Risk  (09/23/2022)   Overall Financial Resource Strain (CARDIA)    Difficulty of Paying Living Expenses: Not hard at all  Food Insecurity: No Food Insecurity (09/23/2022)   Hunger Vital Sign    Worried About Running Out of Food in the Last Year: Never true    Ran Out of Food in the Last Year: Never true  Transportation Needs:  No Transportation Needs (09/23/2022)   PRAPARE - Administrator, Civil Service (Medical): No    Lack of Transportation (Non-Medical): No  Physical Activity: Sufficiently Active (09/23/2022)   Exercise Vital Sign    Days of Exercise per Week: 5 days    Minutes of Exercise per Session: 30 min  Stress: No Stress Concern Present (09/23/2022)   [employer] of Occupational Health - Occupational Stress Questionnaire    Feeling of Stress : Not at all  Social Connections: Moderately Integrated (09/23/2022)   Social Connection and Isolation Panel [NHANES]    Frequency of Communication with Friends and Family: More than three times a week    Frequency of Social Gatherings with Friends and Family: Three times a week    Attends Religious Services: More than 4 times per year    Active Member of Clubs or  Organizations: Yes    Attends Banker Meetings: More than 4 times per year    Marital Status: Never married  Intimate Partner Violence: Not At Risk (09/23/2022)   Humiliation, Afraid, Rape, and Kick questionnaire    Fear of Current or Ex-Partner: No    Emotionally Abused: No    Physically Abused: No    Sexually Abused: No   Family History  Problem Relation Age of Onset   Diabetes Mother    Hypertension Mother    Irregular heart beat Mother        pacemaker   Hearing loss Mother        right   Vision loss Mother        macular degeneration   Rheumatic fever Father        heart murmur   Pneumonia Father    Cancer - Cervical Sister    Heart disease Paternal Aunt    Cancer Paternal Aunt        tumor behind eye   Diabetes Maternal Grandmother    Vision loss Maternal Grandmother    Heart disease Paternal Grandfather    Breast cancer Neg Hx     Objective: Office vital signs reviewed. BP 133/72   Pulse (!) 58   Temp 98.7 F (37.1 C)   Ht 5\' 7"  (1.702 m)   Wt 227 lb (103 kg)   SpO2 94%   BMI 35.55 kg/m   Physical Examination:  General: Awake, alert, well nourished, No acute distress Psych: Mood stable.  Still somewhat anxious but appears to be slightly better than last visit     08/24/2023   10:15 AM 07/27/2023    8:36 AM 07/26/2023   11:18 AM  Depression screen PHQ 2/9  Decreased Interest 1 1 0  Down, Depressed, Hopeless 2 3 3   PHQ - 2 Score 3 4 3   Altered sleeping 0 0 0  Tired, decreased energy 0 1 0  Change in appetite 0 0 0  Feeling bad or failure about yourself  1 3 3   Trouble concentrating 1 3 3   Moving slowly or fidgety/restless 2 3 0  Suicidal thoughts 0 0 0  PHQ-9 Score 7 14 9   Difficult doing work/chores Somewhat difficult Somewhat difficult Extremely dIfficult      08/24/2023   10:15 AM 07/27/2023    8:37 AM 07/26/2023   11:17 AM 06/17/2023    9:43 AM  GAD 7 : Generalized Anxiety Score  Nervous, Anxious, on Edge 3 3 3  0   Control/stop worrying 3 3 3  0  Worry too much - different things 3 3 3  0  Trouble relaxing 2 3 3  0  Restless 0 0 2 0  Easily annoyed or irritable 1 2 2  0  Afraid - awful might happen 1 3 3  0  Total GAD 7 Score 13 17 19  0  Anxiety Difficulty  Somewhat difficult Extremely difficult Not difficult at all      Assessment/ Plan: 72 y.o. female   Generalized anxiety disorder with panic attacks - Plan: sertraline (ZOLOFT) 50 MG tablet, busPIRone (BUSPAR) 7.5 MG tablet, Ambulatory referral to Integrated Behavioral Health  Depression, major, single episode, mild (HCC) - Plan: sertraline (ZOLOFT) 50 MG tablet, Ambulatory referral to Integrated Behavioral Health  Memory change  Clinically seeing improvement with medication.  Will advance Zoloft 50 mg daily and BuSpar to 7.5 mg 3 times daily.  Referral to integrated behavioral health placed today.  Hopefully we can get her in soon with Ar[facility] for counseling services.  4-week follow-up as scheduled.  She will reach out to me in the meantime if any questions or concerns arise  Has appointment scheduled with neurology in February.  Question psychoneurologic component  Patient and/or legal guardian verbally consented to [facility] services about presenting concerns and psychiatric consultation as appropriate.  The services will be billed as appropriate for the patient  Raliegh Ip, DO [facility] [phone]

## 2023-08-24 NOTE — Patient Instructions (Signed)
Dose of Sertraline increased to 50mg . If you have a bunch of the 25mg  left over, ok to double them to equal 50mg  Buspirone increased to 7.5mg  THREE times daily for anxiety I have referred you to [facility] for therapy. He will reach out to schedule a session with you

## 2023-09-02 DIAGNOSIS — H524 Presbyopia: Secondary | ICD-10-CM | POA: Diagnosis not present

## 2023-09-02 DIAGNOSIS — H5203 Hypermetropia, bilateral: Secondary | ICD-10-CM | POA: Diagnosis not present

## 2023-09-02 DIAGNOSIS — H43812 Vitreous degeneration, left eye: Secondary | ICD-10-CM | POA: Diagnosis not present

## 2023-09-02 DIAGNOSIS — H25813 Combined forms of age-related cataract, bilateral: Secondary | ICD-10-CM | POA: Diagnosis not present

## 2023-09-02 DIAGNOSIS — H35363 Drusen (degenerative) of macula, bilateral: Secondary | ICD-10-CM | POA: Diagnosis not present

## 2023-09-02 DIAGNOSIS — H52223 Regular astigmatism, bilateral: Secondary | ICD-10-CM | POA: Diagnosis not present

## 2023-09-09 ENCOUNTER — Telehealth: Payer: Self-pay

## 2023-09-09 ENCOUNTER — Other Ambulatory Visit: Payer: Self-pay | Admitting: *Deleted

## 2023-09-09 DIAGNOSIS — I10 Essential (primary) hypertension: Secondary | ICD-10-CM

## 2023-09-09 DIAGNOSIS — E7849 Other hyperlipidemia: Secondary | ICD-10-CM

## 2023-09-09 DIAGNOSIS — F410 Panic disorder [episodic paroxysmal anxiety] without agoraphobia: Secondary | ICD-10-CM

## 2023-09-09 DIAGNOSIS — J302 Other seasonal allergic rhinitis: Secondary | ICD-10-CM

## 2023-09-09 DIAGNOSIS — E559 Vitamin D deficiency, unspecified: Secondary | ICD-10-CM

## 2023-09-09 NOTE — Telephone Encounter (Signed)
 Copied from CRM [id number]. Topic: Clinical - Medication Refill >> Sep 09, 2023 11:19 AM Graeme ORN wrote: Most Recent Primary Care Visit:  Provider: [name] HERO  Department: [facility]  Visit Type: OFFICE VISIT  Date: 08/24/2023  Medication:  Vitamin D , Ergocalciferol , (DRISDOL ) 1.25 MG (50000 UNIT) CAPS capsule simvastatin  (ZOCOR ) 20 MG tablet lisinopril  (ZESTRIL ) 20 MG tablet levocetirizine (XYZAL ) 5 MG tablet  Has the patient contacted their pharmacy? Yes - [facility] - Pharmacy told her to contact provider  (Agent: If no, request that the patient contact the pharmacy for the refill. If patient does not wish to contact the pharmacy document the reason why and proceed with request.) (Agent: If yes, when and what did the pharmacy advise?)  Is this the correct pharmacy for this prescription? Yes If no, delete pharmacy and type the correct one.  This is the patient's preferred pharmacy:   [id number] - [place], [place] - [address] [place] [address] Phone: [phone] Fax: [phone]   Has the prescription been filled recently? No  Is the patient out of the medication? Yes - only out of Vit D  Has the patient been seen for an appointment in the last year OR does the patient have an upcoming appointment? Yes  Can we respond through MyChart? No  Agent: Please be advised that Rx refills may take up to 3 business days. We ask that you follow-up with your pharmacy.

## 2023-09-09 NOTE — Telephone Encounter (Signed)
 Would you like a Vitamin D level drawn before refilling?

## 2023-09-10 MED ORDER — LISINOPRIL 20 MG PO TABS: 20.0000 mg | ORAL_TABLET | Freq: Every day | ORAL | 3 refills | Status: DC

## 2023-09-10 MED ORDER — VITAMIN D (ERGOCALCIFEROL) 1.25 MG (50000 UNIT) PO CAPS: 50000.0000 [IU] | ORAL_CAPSULE | ORAL | 0 refills | Status: DC

## 2023-09-10 MED ORDER — SIMVASTATIN 20 MG PO TABS: ORAL_TABLET | ORAL | 3 refills | Status: DC

## 2023-09-10 MED ORDER — LEVOCETIRIZINE DIHYDROCHLORIDE 5 MG PO TABS: 5.0000 mg | ORAL_TABLET | Freq: Every evening | ORAL | 3 refills | Status: DC

## 2023-09-10 NOTE — Telephone Encounter (Signed)
 Patient aware and verbalized understanding.

## 2023-09-10 NOTE — Telephone Encounter (Signed)
 Meds sent. Will get level at next OV

## 2023-09-10 NOTE — Addendum Note (Signed)
 Addended by: Raliegh Ip on: 09/10/2023 07:49 AM   Modules accepted: Orders

## 2023-09-27 ENCOUNTER — Ambulatory Visit: Payer: Medicare PPO | Admitting: Family Medicine

## 2023-09-27 ENCOUNTER — Ambulatory Visit (INDEPENDENT_AMBULATORY_CARE_PROVIDER_SITE_OTHER): Payer: Medicare PPO | Admitting: Family Medicine

## 2023-09-27 ENCOUNTER — Encounter: Payer: Self-pay | Admitting: Family Medicine

## 2023-09-27 VITALS — BP 140/87 | HR 61 | Temp 98.4°F | Ht 67.0 in | Wt 228.8 lb

## 2023-09-27 DIAGNOSIS — F410 Panic disorder [episodic paroxysmal anxiety] without agoraphobia: Secondary | ICD-10-CM

## 2023-09-27 DIAGNOSIS — F320 Major depressive disorder, single episode, mild: Secondary | ICD-10-CM

## 2023-09-27 DIAGNOSIS — F411 Generalized anxiety disorder: Secondary | ICD-10-CM | POA: Diagnosis not present

## 2023-09-27 NOTE — Progress Notes (Signed)
Subjective: CC: Anxiety PCP: Rhonda Ip, DO ZOX:WRUEA Rhonda Daniel is a 73 y.o. female presenting to clinic today for:  1.  Anxiety and depression She reports a dramatic improvement in symptoms.  She is taking 50 mg of the Zoloft daily, typically at nighttime.  She has been taking one of the BuSpar each morning and seems to be doing well.  She was able to go to her hairdresser on her own recently and has been driving around by herself as well.  She feels like a "new woman".  She is very pleased with the results and voices appreciation for the help.  She continues to have excellent support by her family and friends   ROS: Per HPI  Allergies  Allergen Reactions   Bee Venom     Local redness and swelling    Past Medical History:  Diagnosis Date   Anxiety    Hyperlipidemia    Hypertension    Sleep apnea    Was on CPAP.   Does not use now.     Current Outpatient Medications:    busPIRone (BUSPAR) 7.5 MG tablet, Take 1 tablet (7.5 mg total) by mouth 3 (three) times daily. For anxiety/ panic **dose change, Disp: 270 tablet, Rfl: 0   fluticasone (FLONASE) 50 MCG/ACT nasal spray, PLACE 2 SPRAYS INTO BOTH NOSTRILS EVERY DAY, Disp: 48 g, Rfl: 3   levocetirizine (XYZAL) 5 MG tablet, Take 1 tablet (5 mg total) by mouth every evening., Disp: 90 tablet, Rfl: 3   lisinopril (ZESTRIL) 20 MG tablet, Take 1 tablet (20 mg total) by mouth daily., Disp: 90 tablet, Rfl: 3   sertraline (ZOLOFT) 50 MG tablet, Take 1 tablet (50 mg total) by mouth daily. **new dose, Disp: 90 tablet, Rfl: 0   simvastatin (ZOCOR) 20 MG tablet, TAKE ONE TABLET DAILY AT 6PM, Disp: 90 tablet, Rfl: 3   Vitamin D, Ergocalciferol, (DRISDOL) 1.25 MG (50000 UNIT) CAPS capsule, Take 1 capsule (50,000 Units total) by mouth every 7 (seven) days., Disp: 12 capsule, Rfl: 0 Social History   Socioeconomic History   Marital status: Single    Spouse name: Not on file   Number of children: 0   Years of education: 17   Highest  education level: Master's degree (Rhonda.g., MA, MS, MEng, MEd, MSW, MBA)  Occupational History   Occupation: Retired     Comment: Advice worker   Tobacco Use   Smoking status: Never   Smokeless tobacco: Never  Vaping Use   Vaping status: Never Used  Substance and Sexual Activity   Alcohol use: No   Drug use: No   Sexual activity: Not Currently    Birth control/protection: Surgical  Other Topics Concern   Not on file  Social History Narrative   Lives with patches (cat) with diabetes.    Took care of her mother until 10/30/17 when she passed away, her father passed away when she was about 44 - so she retired early to care for him   Her brother and 2 nieces live close and visit frequently   Social Drivers of Corporate investment banker Strain: Low Risk  (09/23/2022)   Overall Financial Resource Strain (CARDIA)    Difficulty of Paying Living Expenses: Not hard at all  Food Insecurity: No Food Insecurity (09/23/2022)   Hunger Vital Sign    Worried About Running Out of Food in the Last Year: Never true    Ran Out of Food in the Last Year: Never true  Transportation Needs: No Transportation Needs (09/23/2022)   PRAPARE - Administrator, Civil Service (Medical): No    Lack of Transportation (Non-Medical): No  Physical Activity: Sufficiently Active (09/23/2022)   Exercise Vital Sign    Days of Exercise per Week: 5 days    Minutes of Exercise per Session: 30 min  Stress: No Stress Concern Present (09/23/2022)   [employer] of Occupational Health - Occupational Stress Questionnaire    Feeling of Stress : Not at all  Social Connections: Moderately Integrated (09/23/2022)   Social Connection and Isolation Panel [NHANES]    Frequency of Communication with Friends and Family: More than three times a week    Frequency of Social Gatherings with Friends and Family: Three times a week    Attends Religious Services: More than 4 times per year    Active Member of Clubs or  Organizations: Yes    Attends Banker Meetings: More than 4 times per year    Marital Status: Never married  Intimate Partner Violence: Not At Risk (09/23/2022)   Humiliation, Afraid, Rape, and Kick questionnaire    Fear of Current or Ex-Partner: No    Emotionally Abused: No    Physically Abused: No    Sexually Abused: No   Family History  Problem Relation Age of Onset   Diabetes Mother    Hypertension Mother    Irregular heart beat Mother        pacemaker   Hearing loss Mother        right   Vision loss Mother        macular degeneration   Rheumatic fever Father        heart murmur   Pneumonia Father    Cancer - Cervical Sister    Heart disease Paternal Aunt    Cancer Paternal Aunt        tumor behind eye   Diabetes Maternal Grandmother    Vision loss Maternal Grandmother    Heart disease Paternal Grandfather    Breast cancer Neg Hx     Objective: Office vital signs reviewed. BP (!) 140/87   Pulse 61   Temp 98.4 F (36.9 C)   Ht 5\' 7"  (1.702 m)   Wt 228 lb 12.8 oz (103.8 kg)   SpO2 97%   BMI 35.84 kg/m   Physical Examination:  General: Awake, alert, well nourished, No acute distress HEENT: sclera white, MMM Cardio: regular rate and rhythm, S1S2 heard, no murmurs appreciated Pulm: clear to auscultation bilaterally, no wheezes, rhonchi or rales; normal work of breathing on room air     09/27/2023    9:53 AM 08/24/2023   10:15 AM 07/27/2023    8:36 AM  Depression screen PHQ 2/9  Decreased Interest 1 1 1   Down, Depressed, Hopeless 0 2 3  PHQ - 2 Score 1 3 4   Altered sleeping 0 0 0  Tired, decreased energy 1 0 1  Change in appetite 0 0 0  Feeling bad or failure about yourself  1 1 3   Trouble concentrating 0 1 3  Moving slowly or fidgety/restless 0 2 3  Suicidal thoughts 0 0 0  PHQ-9 Score 3 7 14   Difficult doing work/chores  Somewhat difficult Somewhat difficult      09/27/2023    9:54 AM 08/24/2023   10:15 AM 07/27/2023    8:37 AM  07/26/2023   11:17 AM  GAD 7 : Generalized Anxiety Score  Nervous, Anxious, on Edge 1 3  3 3  Control/stop worrying 1 3 3 3   Worry too much - different things 0 3 3 3   Trouble relaxing 0 2 3 3   Restless 0 0 0 2  Easily annoyed or irritable 0 1 2 2   Afraid - awful might happen 0 1 3 3   Total GAD 7 Score 2 13 17 19   Anxiety Difficulty Not difficult at all  Somewhat difficult Extremely difficult     Assessment/ Plan: 73 y.o. female   Generalized anxiety disorder with panic attacks  Depression, major, single episode, mild (HCC)  Well-controlled with current regimen.  No changes needed.  Follow-up in 3 months and if symptoms remain controlled at that time we will extend back out to every 6 month appointments   Martika Egler Hulen Skains, DO [facility] [phone]

## 2023-10-08 ENCOUNTER — Ambulatory Visit: Payer: Medicare PPO | Admitting: Family Medicine

## 2023-10-22 ENCOUNTER — Ambulatory Visit: Payer: No Typology Code available for payment source | Admitting: Family Medicine

## 2023-10-25 ENCOUNTER — Encounter: Payer: Self-pay | Admitting: Diagnostic Neuroimaging

## 2023-10-25 ENCOUNTER — Ambulatory Visit (INDEPENDENT_AMBULATORY_CARE_PROVIDER_SITE_OTHER): Payer: No Typology Code available for payment source | Admitting: Diagnostic Neuroimaging

## 2023-10-25 VITALS — BP 145/76 | HR 57 | Ht 67.0 in | Wt 230.8 lb

## 2023-10-25 DIAGNOSIS — G3184 Mild cognitive impairment, so stated: Secondary | ICD-10-CM

## 2023-10-25 NOTE — Patient Instructions (Addendum)
  MILD COGNITIVE IMPAIRMENT (since ~2024; after her cat of 22 years passed away; MoCA 23/30; ADLs are preserved; could be related to underlying anxiety, depression; memory symptoms are improved since better tx of anxiety) - try to stay active physically and get some exercise (at least 15-30 minutes per day) - eat a nutritious diet with lean protein, plants / vegetables, whole grains; avoid ultra-processed foods - increase social activities, brain stimulation, games, puzzles, hobbies, crafts, arts, music; try new activities; keep it fun! - aim for at least 7-8 hours sleep per night (or more) - avoid smoking and alcohol - caution with medications, finances, driving - safety / supervision issues reviewed - monitor symptoms; if worsening, then consider MRI brain and labs

## 2023-10-25 NOTE — Progress Notes (Signed)
 [facility]  PATIENT: Rhonda Daniel DOB: Sep 10, 1950  REFERRING CLINICIAN: Raliegh Ip, DO HISTORY FROM: patient and niece REASON FOR VISIT: new consult   HISTORICAL  CHIEF COMPLAINT:  Chief Complaint  Patient presents with   New Patient (Initial Visit)    Pt in room 7. Niece in room. Internal referral for memory changes. Pt said for about 1 year slight memory loss. Pt is forgetful at times.     HISTORY OF PRESENT ILLNESS:   73 year old female here for evaluation of mild memory loss.  Symptoms started in 2022-11-24 after her cat passed away, who had been with patient for 22 years.  This caused some significant trauma, depression as well as anxiety issues.  Since that time she was having more short-term memory loss issues.  She lives alone.  She maintains her ADLs.  She was treated for depression anxiety with medication and symptoms have significantly improved.  Memory loss has improved.   REVIEW OF SYSTEMS: Full 14 system review of systems performed and negative with exception of: as per HPI.  ALLERGIES: Allergies  Allergen Reactions   Bee Venom     Local redness and swelling     HOME MEDICATIONS: Outpatient Medications Prior to Visit  Medication Sig Dispense Refill   busPIRone (BUSPAR) 7.5 MG tablet Take 1 tablet (7.5 mg total) by mouth 3 (three) times daily. For anxiety/ panic **dose change 270 tablet 0   fluticasone (FLONASE) 50 MCG/ACT nasal spray PLACE 2 SPRAYS INTO BOTH NOSTRILS EVERY DAY 48 g 3   levocetirizine (XYZAL) 5 MG tablet Take 1 tablet (5 mg total) by mouth every evening. 90 tablet 3   lisinopril (ZESTRIL) 20 MG tablet Take 1 tablet (20 mg total) by mouth daily. 90 tablet 3   sertraline (ZOLOFT) 50 MG tablet Take 1 tablet (50 mg total) by mouth daily. **new dose 90 tablet 0   simvastatin (ZOCOR) 20 MG tablet TAKE ONE TABLET DAILY AT 6PM 90 tablet 3   Vitamin D, Ergocalciferol, (DRISDOL) 1.25 MG (50000 UNIT) CAPS capsule Take 1 capsule  (50,000 Units total) by mouth every 7 (seven) days. 12 capsule 0   No facility-administered medications prior to visit.    PAST MEDICAL HISTORY: Past Medical History:  Diagnosis Date   Anxiety    Hyperlipidemia    Hypertension    Sleep apnea    Was on CPAP.   Does not use now.     PAST SURGICAL HISTORY: Past Surgical History:  Procedure Laterality Date   ABDOMINAL HYSTERECTOMY     APPENDECTOMY     with malignancy of appendix   CHOLECYSTECTOMY     COLON SURGERY     preventative - with appendix malignancy   HERNIA REPAIR      FAMILY HISTORY: Family History  Problem Relation Age of Onset   Diabetes Mother    Hypertension Mother    Irregular heart beat Mother        pacemaker   Hearing loss Mother        right   Vision loss Mother        macular degeneration   Rheumatic fever Father        heart murmur   Pneumonia Father    Cancer - Cervical Sister    Heart disease Paternal Aunt    Cancer Paternal Aunt        tumor behind eye   Diabetes Maternal Grandmother    Vision loss Maternal Grandmother    Heart  disease Paternal Grandfather    Breast cancer Neg Hx     SOCIAL HISTORY: Social History   Socioeconomic History   Marital status: Single    Spouse name: Not on file   Number of children: 0   Years of education: 16   Highest education level: Master's degree (e.g., MA, MS, MEng, MEd, MSW, MBA)  Occupational History   Occupation: Retired     Comment: Advice worker   Tobacco Use   Smoking status: Never   Smokeless tobacco: Never  Vaping Use   Vaping status: Never Used  Substance and Sexual Activity   Alcohol use: No   Drug use: No   Sexual activity: Not Currently    Birth control/protection: Surgical  Other Topics Concern   Not on file  Social History Narrative   Pt lives alone, pt is able to drive. Able to complete ADL'S    Took care of her mother until November 02, 2017 when she passed away, her father passed away when she was about 68 - so she retired early  to care for him   Her brother and 2 nieces live close and visit frequently   Social Drivers of Health   Financial Resource Strain: Medium Risk (09/27/2023)   Overall Financial Resource Strain (CARDIA)    Difficulty of Paying Living Expenses: Somewhat hard  Food Insecurity: No Food Insecurity (09/27/2023)   Hunger Vital Sign    Worried About Running Out of Food in the Last Year: Never true    Ran Out of Food in the Last Year: Never true  Transportation Needs: No Transportation Needs (09/27/2023)   PRAPARE - Administrator, Civil Service (Medical): No    Lack of Transportation (Non-Medical): No  Physical Activity: Sufficiently Active (09/27/2023)   Exercise Vital Sign    Days of Exercise per Week: 5 days    Minutes of Exercise per Session: 30 min  Stress: No Stress Concern Present (09/27/2023)   [employer] of Occupational Health - Occupational Stress Questionnaire    Feeling of Stress : Not at all  Social Connections: Moderately Integrated (09/23/2022)   Social Connection and Isolation Panel [NHANES]    Frequency of Communication with Friends and Family: More than three times a week    Frequency of Social Gatherings with Friends and Family: Three times a week    Attends Religious Services: More than 4 times per year    Active Member of Clubs or Organizations: Yes    Attends Banker Meetings: More than 4 times per year    Marital Status: Never married  Intimate Partner Violence: Not At Risk (09/27/2023)   Humiliation, Afraid, Rape, and Kick questionnaire    Fear of Current or Ex-Partner: No    Emotionally Abused: No    Physically Abused: No    Sexually Abused: No     PHYSICAL EXAM  GENERAL EXAM/CONSTITUTIONAL: Vitals:  Vitals:   10/25/23 1128  BP: (!) 145/76  Pulse: (!) 57  Weight: 230 lb 12.8 oz (104.7 kg)  Height: 5\' 7"  (1.702 m)   Body mass index is 36.15 kg/m. Wt Readings from Last 3 Encounters:  10/25/23 230 lb 12.8 oz (104.7 kg)   09/27/23 228 lb 12.8 oz (103.8 kg)  08/24/23 227 lb (103 kg)   Patient is in no distress; well developed, nourished and groomed; neck is supple  CARDIOVASCULAR: Examination of carotid arteries is normal; no carotid bruits Regular rate and rhythm, no murmurs Examination of peripheral vascular system by observation  and palpation is normal  EYES: Ophthalmoscopic exam of optic discs and posterior segments is normal; no papilledema or hemorrhages No results found.  MUSCULOSKELETAL: Gait, strength, tone, movements noted in Neurologic exam below  NEUROLOGIC: MENTAL STATUS:     07/26/2023   11:41 AM 02/10/2023    9:52 AM 09/15/2018    4:12 PM  MMSE - Mini Mental State Exam  Orientation to time 5 5 5   Orientation to Place 5 5 5   Registration 3 3 3   Attention/ Calculation 5 5 5   Recall 0 3 3  Language- name 2 objects 2 2 2   Language- repeat 0 1 1  Language- follow 3 step command 3 3 3   Language- read & follow direction 1 1 1   Write a sentence 1 1 1   Copy design 1 1 1   Total score 26 30 30       10/25/2023   11:34 AM  Montreal Cognitive Assessment   Visuospatial/ Executive (0/5) 4  Naming (0/3) 3  Attention: Read list of digits (0/2) 2  Attention: Read list of letters (0/1) 1  Attention: Serial 7 subtraction starting at 100 (0/3) 3  Language: Repeat phrase (0/2) 2  Language : Fluency (0/1) 0  Abstraction (0/2) 2  Delayed Recall (0/5) 0  Orientation (0/6) 6  Total 23   awake, alert, oriented to person, place and time recent and remote memory intact normal attention and concentration language fluent, comprehension intact, naming intact fund of knowledge appropriate  CRANIAL NERVE:  2nd - no papilledema on fundoscopic exam 2nd, 3rd, 4th, 6th - pupils equal and reactive to light, visual fields full to confrontation, extraocular muscles intact, no nystagmus 5th - facial sensation symmetric 7th - facial strength symmetric 8th - hearing intact 9th - palate elevates  symmetrically, uvula midline 11th - shoulder shrug symmetric 12th - tongue protrusion midline  MOTOR:  normal bulk and tone, full strength in the BUE, BLE  SENSORY:  normal and symmetric to light touch, temperature, vibration  COORDINATION:  finger-nose-finger, fine finger movements normal  REFLEXES:  deep tendon reflexes TRACE and symmetric  GAIT/STATION:  narrow based gait     DIAGNOSTIC DATA (LABS, IMAGING, TESTING) - I reviewed patient records, labs, notes, testing and imaging myself where available.  Lab Results  Component Value Date   WBC 5.3 06/17/2023   HGB 14.4 06/17/2023   HCT 45.2 06/17/2023   MCV 96 06/17/2023   PLT 236 06/17/2023      Component Value Date/Time   NA 144 06/17/2023 1045   K 4.5 06/17/2023 1045   CL 105 06/17/2023 1045   CO2 24 06/17/2023 1045   GLUCOSE 97 06/17/2023 1045   BUN 15 06/17/2023 1045   CREATININE 0.68 06/17/2023 1045   CALCIUM 9.6 06/17/2023 1045   PROT 6.7 06/17/2023 1045   ALBUMIN 4.3 06/17/2023 1045   AST 21 06/17/2023 1045   ALT 16 06/17/2023 1045   ALKPHOS 75 06/17/2023 1045   BILITOT 0.5 06/17/2023 1045   GFRNONAA 82 07/16/2020 1446   GFRAA 95 07/16/2020 1446   Lab Results  Component Value Date   CHOL 151 02/10/2023   HDL 58 02/10/2023   LDLCALC 77 02/10/2023   TRIG 86 02/10/2023   CHOLHDL 2.6 02/10/2023   Lab Results  Component Value Date   HGBA1C 4.9 06/17/2023   No results found for: "VITAMINB12" Lab Results  Component Value Date   TSH 1.610 02/10/2023    ASSESSMENT AND PLAN  73 y.o. year old female here  with:  Dx:  1. MCI (mild cognitive impairment)     PLAN:  MILD COGNITIVE IMPAIRMENT (since ~2024; after her cat of 22 years passed away; MoCA 23/30; ADLs are preserved; could be related to underlying anxiety, depression; memory symptoms are improved since better tx of anxiety) - try to stay active physically and get some exercise (at least 15-30 minutes per day) - eat a nutritious diet  with lean protein, plants / vegetables, whole grains; avoid ultra-processed foods - increase social activities, brain stimulation, games, puzzles, hobbies, crafts, arts, music; try new activities; keep it fun! - aim for at least 7-8 hours sleep per night (or more) - avoid smoking and alcohol - caution with medications, finances, driving - safety / supervision issues reviewed - monitor symptoms; if worsening, then consider MRI brain, B12, ATN panel  Return for return to PCP, pending if symptoms worsen or fail to improve.    Suanne Marker, MD 10/25/2023, 11:53 AM Certified in Neurology, Neurophysiology and Neuroimaging  [facility] [address] [place], [place] [address] [phone]

## 2023-11-02 ENCOUNTER — Institutional Professional Consult (permissible substitution): Payer: Medicare PPO | Admitting: Professional Counselor

## 2023-11-09 ENCOUNTER — Ambulatory Visit: Payer: Medicare PPO | Admitting: Professional Counselor

## 2023-11-09 DIAGNOSIS — F411 Generalized anxiety disorder: Secondary | ICD-10-CM

## 2023-11-09 DIAGNOSIS — F410 Panic disorder [episodic paroxysmal anxiety] without agoraphobia: Secondary | ICD-10-CM

## 2023-11-09 NOTE — Patient Instructions (Signed)
 If your symptoms worsen or you have thoughts of suicide/homicide, PLEASE SEEK IMMEDIATE MEDICAL ATTENTION.  You may always call:   National Suicide Hotline: 988 or (913)195-1115 [place] Crisis Line: 343-675-8477 Crisis Recovery in [place]: [phone]     These are available 24 hours a day, 7 days a week.

## 2023-11-09 NOTE — BH Specialist Note (Addendum)
 Collaborative Care Initial Assessment  Session Start time: 9:00 am   Session End time: 10:00 am  Total time in minutes: 60 min   Type of Contact:  Face to Face Patient consent obtained:  Yes Types of Service: Collaborative care  Summary  Patient is a 73 yo female being referred to collaborative care by her pcp for anxiety and depression. Patient was engaged and cooperative during session.   Reason for referral in patient/family's own words:  "I had a bad bout of anxiety last October"  Patient's goal for today's visit: "Just wanted to share but I feel like I have overcome it.   History of Present illness:   The patient is a 73 year old female presenting for a collaborative care assessment due to anxiety that began suddenly in October. She described feeling anxious and depressed, crying frequently, and isolating herself due to uncertainty about what was happening. She also experienced sleep disturbances, waking up multiple times at night, and feeling on edge, nervous, and irritable. After some time, she reached out to her doctor and was started on Zoloft 50 mg, which she reports helped to some extent. She was later prescribed BuSpar as needed for anxiety.  Today, she presents in a positive mood and states she feels much better than when she initially made the appointment. Her symptoms have improved significantly, with a PHQ-9 score of 3 and a GAD-7 score of 4, both markedly lower than in December. She lives alone but has found support through family, her doctor, and a neurologist, which she found helpful. She attributes much of her improvement to having someone to talk to and feels her medication is working well. She has no prior history of depression, anxiety, trauma, psychiatric hospitalization, or substance use, describing her life as generally positive and fulfilling. Given the sudden onset of symptoms and her significant improvement, she is unsure whether she needs ongoing collaborative  care.   The next step is a psychiatric consultation, after which we will reassess. A follow-up is scheduled in two weeks, and if the psychiatrist agrees, she will likely be discharged from the program with a referral for traditional therapy if needed   Clinical Assessment   PHQ-9 Assessments:    11/09/2023    9:20 AM 09/27/2023    9:53 AM 08/24/2023   10:15 AM 07/27/2023    8:36 AM 07/26/2023   11:18 AM  Depression screen PHQ 2/9  Decreased Interest 0 1 1 1  0  Down, Depressed, Hopeless 0 0 2 3 3   PHQ - 2 Score 0 1 3 4 3   Altered sleeping 1 0 0 0 0  Tired, decreased energy 1 1 0 1 0  Change in appetite 0 0 0 0 0  Feeling bad or failure about yourself  1 1 1 3 3   Trouble concentrating 0 0 1 3 3   Moving slowly or fidgety/restless 0 0 2 3 0  Suicidal thoughts 0 0 0 0 0  PHQ-9 Score 3 3 7 14 9   Difficult doing work/chores   Somewhat difficult Somewhat difficult Extremely dIfficult    GAD-7 Assessments:    11/09/2023    9:22 AM 09/27/2023    9:54 AM 08/24/2023   10:15 AM 07/27/2023    8:37 AM  GAD 7 : Generalized Anxiety Score  Nervous, Anxious, on Edge 1 1 3 3   Control/stop worrying 0 1 3 3   Worry too much - different things 1 0 3 3  Trouble relaxing 0 0 2 3  Restless 0  0 0 0  Easily annoyed or irritable 1 0 1 2  Afraid - awful might happen 1 0 1 3  Total GAD 7 Score 4 2 13 17   Anxiety Difficulty Somewhat difficult Not difficult at all  Somewhat difficult     Social History:  Household: Lives alone Marital status: Single Number of Children: None Employment: Retired Education: Masters   Psychiatric Review of systems: Insomnia: No Changes in appetite:  Decreased need for sleep: No Family history of bipolar disorder: No Hallucinations: No   Paranoia: No    Psychotropic medications: Current medications: Zoloft and Buspar Patient taking medications as prescribed:  Yes Side effects reported: No  Current medications (medication list) Current Outpatient Medications  on File Prior to Visit  Medication Sig Dispense Refill   busPIRone (BUSPAR) 7.5 MG tablet Take 1 tablet (7.5 mg total) by mouth 3 (three) times daily. For anxiety/ panic **dose change 270 tablet 0   fluticasone (FLONASE) 50 MCG/ACT nasal spray PLACE 2 SPRAYS INTO BOTH NOSTRILS EVERY DAY 48 g 3   levocetirizine (XYZAL) 5 MG tablet Take 1 tablet (5 mg total) by mouth every evening. 90 tablet 3   lisinopril (ZESTRIL) 20 MG tablet Take 1 tablet (20 mg total) by mouth daily. 90 tablet 3   sertraline (ZOLOFT) 50 MG tablet Take 1 tablet (50 mg total) by mouth daily. **new dose 90 tablet 0   simvastatin (ZOCOR) 20 MG tablet TAKE ONE TABLET DAILY AT 6PM 90 tablet 3   Vitamin D, Ergocalciferol, (DRISDOL) 1.25 MG (50000 UNIT) CAPS capsule Take 1 capsule (50,000 Units total) by mouth every 7 (seven) days. 12 capsule 0   No current facility-administered medications on file prior to visit.    Psychiatric History: Past psychiatry diagnosis: Anxiety and Depression Patient currently being seen by therapist/psychiatrist: No Prior Suicide Attempts: None Past psychiatry Hospitalization(s): None Past history of violence: None  Traumatic Experiences: History or current traumatic events (natural disaster, house fire, etc.)? no History or current physical trauma?  no History or current emotional trauma?  no History or current sexual trauma?  no History or current domestic or intimate partner violence?  no PTSD symptoms if any traumatic experiences no (Details: )  Alcohol and/or Substance Use History   Tobacco Alcohol Other substances  Current use  Never Never  Past use     Past treatment      Withdrawal Potential: None  Self-harm Behaviors Risk Assessment Self-harm risk factors:  None Patient endorses recent thoughts of harming self: Denies [place] Suicide Severity Rating Scale:   Guns in the home: None   Protective factors: Family support  Danger to Others Risk Assessment Danger to others  risk factors:   Patient endorses recent thoughts of harming others:   Dynamic Appraisal of Situational Aggression (DASA):   BH Counselor discussed emergency crisis plan with client and provided local emergency services resources.  Mental status exam:   General Appearance Rhonda Daniel:  Neat Eye Contact:  Good Motor Behavior:  Normal Speech:  Normal Level of Consciousness:  Alert Mood:   Positive Affect:  Appropriate Anxiety Level:  Minimal Thought Process:  Coherent Thought Content:  WNL Perception:  Normal Judgment:  Good Insight:  Present  Diagnosis:   Goals: Increase healthy adjustment to current life circumstances   Interventions: Motivational Interviewing   Follow-up Plan:

## 2023-11-12 ENCOUNTER — Telehealth (INDEPENDENT_AMBULATORY_CARE_PROVIDER_SITE_OTHER): Payer: Self-pay | Admitting: Professional Counselor

## 2023-11-12 DIAGNOSIS — F320 Major depressive disorder, single episode, mild: Secondary | ICD-10-CM

## 2023-11-12 NOTE — BH Specialist Note (Signed)
 Virtual Behavioral Health Treatment Plan Team Note  MRN: [id number] NAME: Rhonda Daniel  DATE: 11/18/23  Start time: 11:00 am End time: Stop Time: 111511:15 am Total time: Total Time in Minutes (Visit): 1515 min  Total number of Virtual BH Treatment Team Plan encounters: 1/4  Treatment Team Attendees: Dr. Vivia Ewing and Esmond Harps  Collaborative Care Psychiatric Consultant Case Review    Assessment/Provisional Diagnosis Rhonda Daniel is a 73 y.o. year old female with history of Anxiety and depression, HTN, HLD. The patient is referred for anxiety   # patient had severe anxiety back in October and was started on zoloft and buspar. Currently feeling back to normal with reporting some mild anxiety symptoms.  She is not sure she would like to continue to see collaborative care or not. She would benefit from Therapy for ongoing support. Most recent GAD-7 of 4 and PHQ-9 of 3. No reported SI or HI, AH or VH.     Dx: GAD   Recommendation  Continue current meds: zoloft 50 mg and Buspar 7.5 mg tid.  Refer to therapy.  BH specialist to follow up.  Diagnoses:    ICD-10-CM   1. Depression, major, single episode, mild (HCC)  F32.0       Goals, Interventions and Follow-up Plan Goals: Increase healthy adjustment to current life circumstances Interventions: Motivational Interviewing Medication Management Recommendations: Continue current regimen Follow-up Plan:  Refer to traditional therapy but bridge to services  History of the present illness Presenting Problem/Current Symptoms:  The patient is a 73 year old female presenting for a collaborative care assessment due to anxiety that began suddenly in October. She described feeling anxious and depressed, crying frequently, and isolating herself due to uncertainty about what was happening. She also experienced sleep disturbances, waking up multiple times at night, and feeling on edge, nervous, and irritable. After some time, she reached out to  her doctor and was started on Zoloft 50 mg, which she reports helped to some extent. She was later prescribed BuSpar as needed for anxiety.   Today, she presents in a positive mood and states she feels much better than when she initially made the appointment. Her symptoms have improved significantly, with a PHQ-9 score of 3 and a GAD-7 score of 4, both markedly lower than in December. She lives alone but has found support through family, her doctor, and a neurologist, which she found helpful. She attributes much of her improvement to having someone to talk to and feels her medication is working well. She has no prior history of depression, anxiety, trauma, psychiatric hospitalization, or substance use, describing her life as generally positive and fulfilling. Given the sudden onset of symptoms and her significant improvement, she is unsure whether she needs ongoing collaborative care.   Screenings PHQ-9 Assessments:     11/09/2023    9:20 AM 09/27/2023    9:53 AM 08/24/2023   10:15 AM  Depression screen PHQ 2/9  Decreased Interest 0 1 1  Down, Depressed, Hopeless 0 0 2  PHQ - 2 Score 0 1 3  Altered sleeping 1 0 0  Tired, decreased energy 1 1 0  Change in appetite 0 0 0  Feeling bad or failure about yourself  1 1 1   Trouble concentrating 0 0 1  Moving slowly or fidgety/restless 0 0 2  Suicidal thoughts 0 0 0  PHQ-9 Score 3 3 7   Difficult doing work/chores   Somewhat difficult   GAD-7 Assessments:     11/09/2023  9:22 AM 09/27/2023    9:54 AM 08/24/2023   10:15 AM 07/27/2023    8:37 AM  GAD 7 : Generalized Anxiety Score  Nervous, Anxious, on Edge 1 1 3 3   Control/stop worrying 0 1 3 3   Worry too much - different things 1 0 3 3  Trouble relaxing 0 0 2 3  Restless 0 0 0 0  Easily annoyed or irritable 1 0 1 2  Afraid - awful might happen 1 0 1 3  Total GAD 7 Score 4 2 13 17   Anxiety Difficulty Somewhat difficult Not difficult at all  Somewhat difficult    Past Medical History Past  Medical History:  Diagnosis Date   Anxiety    Hyperlipidemia    Hypertension    Sleep apnea    Was on CPAP.   Does not use now.     Vital signs: There were no vitals filed for this visit.  Allergies:  Allergies as of 11/12/2023 - Review Complete 10/25/2023  Allergen Reaction Noted   Bee venom  12/29/2016    Medication History Current medications:  Outpatient Encounter Medications as of 11/12/2023  Medication Sig   busPIRone (BUSPAR) 7.5 MG tablet Take 1 tablet (7.5 mg total) by mouth 3 (three) times daily. For anxiety/ panic **dose change   fluticasone (FLONASE) 50 MCG/ACT nasal spray PLACE 2 SPRAYS INTO BOTH NOSTRILS EVERY DAY   levocetirizine (XYZAL) 5 MG tablet Take 1 tablet (5 mg total) by mouth every evening.   lisinopril (ZESTRIL) 20 MG tablet Take 1 tablet (20 mg total) by mouth daily.   sertraline (ZOLOFT) 50 MG tablet Take 1 tablet (50 mg total) by mouth daily. **new dose   simvastatin (ZOCOR) 20 MG tablet TAKE ONE TABLET DAILY AT 6PM   Vitamin D, Ergocalciferol, (DRISDOL) 1.25 MG (50000 UNIT) CAPS capsule Take 1 capsule (50,000 Units total) by mouth every 7 (seven) days.   No facility-administered encounter medications on file as of 11/12/2023.     Scribe for Treatment Team: Reuel Boom

## 2023-11-16 ENCOUNTER — Ambulatory Visit

## 2023-11-22 ENCOUNTER — Other Ambulatory Visit: Payer: Self-pay | Admitting: Family Medicine

## 2023-11-22 DIAGNOSIS — E559 Vitamin D deficiency, unspecified: Secondary | ICD-10-CM

## 2023-11-22 DIAGNOSIS — F410 Panic disorder [episodic paroxysmal anxiety] without agoraphobia: Secondary | ICD-10-CM

## 2023-11-22 DIAGNOSIS — F320 Major depressive disorder, single episode, mild: Secondary | ICD-10-CM

## 2023-11-22 NOTE — Telephone Encounter (Signed)
 Last Vit D level was 08/07/2022-ok to refill?

## 2023-11-23 ENCOUNTER — Ambulatory Visit: Admitting: Professional Counselor

## 2023-11-23 ENCOUNTER — Institutional Professional Consult (permissible substitution): Admitting: Professional Counselor

## 2023-11-23 DIAGNOSIS — F320 Major depressive disorder, single episode, mild: Secondary | ICD-10-CM | POA: Diagnosis not present

## 2023-11-23 NOTE — Patient Instructions (Signed)
 Rhonda Daniel, [facility], [facility]  Address: [address] [place], [place] [address]  Phone: [phone]  Services: Individual/couples therapy, mental wellness and behavioral coaching Website: [employer] in [place], [place]

## 2023-11-23 NOTE — BH Specialist Note (Signed)
 [facility] BH Follow-up  MRN: [id number] NAME: Rhonda Daniel Date: 11/23/23  Start time: Start Time: 0200 End time: Stop Time: 0230 Total time: Total Time in Minutes (Visit): 30 Call number: Visit Number: 3- Third Visit  Reason for call today: The patient is a 73 year old female who presented for a collaborative care follow-up. The purpose of the visit was to discuss the psychiatric consultation and determine the most appropriate next steps for her care. It was concluded that she would benefit more from a traditional therapy approach, as her symptoms have significantly decreased since her original referral. Additionally, she expressed a preference for talk therapy.  During today's session, the patient reported a complete turnaround in her mental health, with a PHQ-9 score of 0 and a GAD-7 score of 0, confirming that she no longer requires collaborative care. She stated that she feels 100% better and attributes her improvement to the medication, her single session in collaborative care, and reconnecting with a friend for support. Given her progress, the plan is to transition her to traditional therapy, which she agrees with. Collaborative care services will be discontinued at this time.   PHQ-9 Scores:     11/09/2023    9:20 AM 09/27/2023    9:53 AM 08/24/2023   10:15 AM 07/27/2023    8:36 AM 07/26/2023   11:18 AM  Depression screen PHQ 2/9  Decreased Interest 0 1 1 1  0  Down, Depressed, Hopeless 0 0 2 3 3   PHQ - 2 Score 0 1 3 4 3   Altered sleeping 1 0 0 0 0  Tired, decreased energy 1 1 0 1 0  Change in appetite 0 0 0 0 0  Feeling bad or failure about yourself  1 1 1 3 3   Trouble concentrating 0 0 1 3 3   Moving slowly or fidgety/restless 0 0 2 3 0  Suicidal thoughts 0 0 0 0 0  PHQ-9 Score 3 3 7 14 9   Difficult doing work/chores   Somewhat difficult Somewhat difficult Extremely dIfficult   GAD-7 Scores:     11/09/2023    9:22 AM 09/27/2023    9:54 AM 08/24/2023   10:15 AM 07/27/2023     8:37 AM  GAD 7 : Generalized Anxiety Score  Nervous, Anxious, on Edge 1 1 3 3   Control/stop worrying 0 1 3 3   Worry too much - different things 1 0 3 3  Trouble relaxing 0 0 2 3  Restless 0 0 0 0  Easily annoyed or irritable 1 0 1 2  Afraid - awful might happen 1 0 1 3  Total GAD 7 Score 4 2 13 17   Anxiety Difficulty Somewhat difficult Not difficult at all  Somewhat difficult    Stress Current stressors:  None Sleep:  Good Appetite:  Good Coping ability:  Good Patient taking medications as prescribed:  Yes  Current medications:  Outpatient Encounter Medications as of 11/23/2023  Medication Sig   busPIRone (BUSPAR) 7.5 MG tablet Take 1 tablet (7.5 mg total) by mouth 3 (three) times daily. For anxiety/ panic **dose change   fluticasone (FLONASE) 50 MCG/ACT nasal spray PLACE 2 SPRAYS INTO BOTH NOSTRILS EVERY DAY   levocetirizine (XYZAL) 5 MG tablet Take 1 tablet (5 mg total) by mouth every evening.   lisinopril (ZESTRIL) 20 MG tablet Take 1 tablet (20 mg total) by mouth daily.   sertraline (ZOLOFT) 50 MG tablet TAKE 1 TABLET (50 MG TOTAL) BY MOUTH DAILY. **NEW DOSE   simvastatin (ZOCOR)  20 MG tablet TAKE ONE TABLET DAILY AT 6PM   Vitamin D, Ergocalciferol, (DRISDOL) 1.25 MG (50000 UNIT) CAPS capsule TAKE 1 CAPSULE (50,000 UNITS TOTAL) BY MOUTH EVERY 7 (SEVEN) DAYS   No facility-administered encounter medications on file as of 11/23/2023.     Self-harm Behaviors Risk Assessment Self-harm risk factors:  Aging, Depression Patient endorses recent thoughts of harming self:     Danger to Others Risk Assessment Danger to others risk factors:  None Patient endorses recent thoughts of harming others:  Denies   Substance Use Assessment Patient recently consumed alcohol:  None  Alcohol Use Disorder Identification Test (AUDIT):     09/22/2021    4:30 PM 09/23/2022    3:02 PM 09/27/2023    9:48 AM  Alcohol Use Disorder Test (AUDIT)  1. How often do you have a drink containing  alcohol? 0 0 0  2. How many drinks containing alcohol do you have on a typical day when you are drinking? 0 0 0  3. How often do you have six or more drinks on one occasion? 0 0 0  AUDIT-C Score 0 0 0    Goals, Interventions and Follow-up Plan Goals: Increase healthy adjustment to current life circumstances Interventions: Medication Monitoring Follow-up Plan: Refer to [facility]    Reuel Boom

## 2023-11-30 ENCOUNTER — Other Ambulatory Visit: Payer: Self-pay | Admitting: Family Medicine

## 2023-11-30 DIAGNOSIS — Z1231 Encounter for screening mammogram for malignant neoplasm of breast: Secondary | ICD-10-CM

## 2023-12-01 ENCOUNTER — Ambulatory Visit
Admission: RE | Admit: 2023-12-01 | Discharge: 2023-12-01 | Disposition: A | Discharge status: Home / Self Care | Payer: No Typology Code available for payment source | Source: Ambulatory Visit | Attending: Family Medicine | Admitting: Family Medicine

## 2023-12-01 DIAGNOSIS — Z1231 Encounter for screening mammogram for malignant neoplasm of breast: Secondary | ICD-10-CM | POA: Diagnosis not present

## 2023-12-20 ENCOUNTER — Encounter: Payer: Self-pay | Admitting: Family Medicine

## 2023-12-20 ENCOUNTER — Ambulatory Visit (INDEPENDENT_AMBULATORY_CARE_PROVIDER_SITE_OTHER): Payer: No Typology Code available for payment source | Admitting: Family Medicine

## 2023-12-20 VITALS — BP 133/75 | HR 72 | Temp 98.0°F | Ht 67.0 in | Wt 232.0 lb

## 2023-12-20 DIAGNOSIS — F411 Generalized anxiety disorder: Secondary | ICD-10-CM | POA: Diagnosis not present

## 2023-12-20 DIAGNOSIS — F320 Major depressive disorder, single episode, mild: Secondary | ICD-10-CM

## 2023-12-20 DIAGNOSIS — M170 Bilateral primary osteoarthritis of knee: Secondary | ICD-10-CM | POA: Diagnosis not present

## 2023-12-20 DIAGNOSIS — F410 Panic disorder [episodic paroxysmal anxiety] without agoraphobia: Secondary | ICD-10-CM | POA: Diagnosis not present

## 2023-12-20 MED ORDER — SERTRALINE HCL 50 MG PO TABS: 50.0000 mg | ORAL_TABLET | Freq: Every day | ORAL | 4 refills | Status: DC

## 2023-12-20 MED ORDER — BUSPIRONE HCL 7.5 MG PO TABS: 7.5000 mg | ORAL_TABLET | Freq: Three times a day (TID) | ORAL | 4 refills | Status: DC

## 2023-12-20 NOTE — Progress Notes (Signed)
 Subjective: CC: Chronic follow-up for anxiety PCP: Raliegh Ip, DO ZOX:[id number] Rhonda Daniel is a 73 y.o. female presenting to clinic today for:  1.  Generalized anxiety disorder with panic attacks and major depressive disorder She reports that she has been doing really well on the Zoloft 50 mg, BuSpar 7.5 mg 3 times daily.  She reports no concerns from a mental health standpoint.  She continues to try and stay active and involved.  2.  Bilateral knee pain associated with morbid obesity Patient reports right greater than left knee pain.  She has not seen anybody about this but is interested in proceeding with treatment as she continues to struggle quite a bit.  She had considered staying with her family member while she may be had some knee surgery done but she decided that she would like to see somebody locally instead.  She reports some instability of that right knee where it wants to buckle but she does not report any falls, preceding injury.  Symptoms are refractory to OTC managements   ROS: Per HPI  Allergies  Allergen Reactions   Bee Venom     Local redness and swelling    Past Medical History:  Diagnosis Date   Anxiety    Hyperlipidemia    Hypertension    Sleep apnea    Was on CPAP.   Does not use now.     Current Outpatient Medications:    busPIRone (BUSPAR) 7.5 MG tablet, Take 1 tablet (7.5 mg total) by mouth 3 (three) times daily. For anxiety/ panic **dose change, Disp: 270 tablet, Rfl: 0   fluticasone (FLONASE) 50 MCG/ACT nasal spray, PLACE 2 SPRAYS INTO BOTH NOSTRILS EVERY DAY, Disp: 48 g, Rfl: 3   levocetirizine (XYZAL) 5 MG tablet, Take 1 tablet (5 mg total) by mouth every evening., Disp: 90 tablet, Rfl: 3   lisinopril (ZESTRIL) 20 MG tablet, Take 1 tablet (20 mg total) by mouth daily., Disp: 90 tablet, Rfl: 3   sertraline (ZOLOFT) 50 MG tablet, TAKE 1 TABLET (50 MG TOTAL) BY MOUTH DAILY. **NEW DOSE, Disp: 90 tablet, Rfl: 0   simvastatin (ZOCOR) 20 MG tablet, TAKE  ONE TABLET DAILY AT 6PM, Disp: 90 tablet, Rfl: 3   Vitamin D, Ergocalciferol, (DRISDOL) 1.25 MG (50000 UNIT) CAPS capsule, TAKE 1 CAPSULE (50,000 UNITS TOTAL) BY MOUTH EVERY 7 (SEVEN) DAYS, Disp: 12 capsule, Rfl: 1 Social History   Socioeconomic History   Marital status: Single    Spouse name: Not on file   Number of children: 0   Years of education: 16   Highest education level: Master's degree (Rhonda.g., MA, MS, MEng, MEd, MSW, MBA)  Occupational History   Occupation: Retired     Comment: Advice worker   Tobacco Use   Smoking status: Never   Smokeless tobacco: Never  Vaping Use   Vaping status: Never Used  Substance and Sexual Activity   Alcohol use: No   Drug use: No   Sexual activity: Not Currently    Birth control/protection: Surgical  Other Topics Concern   Not on file  Social History Narrative   Pt lives alone, pt is able to drive. Able to complete ADL'S    Took care of her mother until 2018/01/13 when she passed away, her father passed away when she was about 46 - so she retired early to care for him   Her brother and 2 nieces live close and visit frequently   Social Drivers of Corporate investment banker  Strain: Medium Risk (09/27/2023)   Overall Financial Resource Strain (CARDIA)    Difficulty of Paying Living Expenses: Somewhat hard  Food Insecurity: No Food Insecurity (09/27/2023)   Hunger Vital Sign    Worried About Running Out of Food in the Last Year: Never true    Ran Out of Food in the Last Year: Never true  Transportation Needs: No Transportation Needs (09/27/2023)   PRAPARE - Administrator, Civil Service (Medical): No    Lack of Transportation (Non-Medical): No  Physical Activity: Sufficiently Active (09/27/2023)   Exercise Vital Sign    Days of Exercise per Week: 5 days    Minutes of Exercise per Session: 30 min  Stress: No Stress Concern Present (09/27/2023)   [employer] of Occupational Health - Occupational Stress Questionnaire    Feeling  of Stress : Not at all  Social Connections: Moderately Integrated (09/23/2022)   Social Connection and Isolation Panel [NHANES]    Frequency of Communication with Friends and Family: More than three times a week    Frequency of Social Gatherings with Friends and Family: Three times a week    Attends Religious Services: More than 4 times per year    Active Member of Clubs or Organizations: Yes    Attends Banker Meetings: More than 4 times per year    Marital Status: Never married  Intimate Partner Violence: Not At Risk (09/27/2023)   Humiliation, Afraid, Rape, and Kick questionnaire    Fear of Current or Ex-Partner: No    Emotionally Abused: No    Physically Abused: No    Sexually Abused: No   Family History  Problem Relation Age of Onset   Diabetes Mother    Hypertension Mother    Irregular heart beat Mother        pacemaker   Hearing loss Mother        right   Vision loss Mother        macular degeneration   Rheumatic fever Father        heart murmur   Pneumonia Father    Cancer - Cervical Sister    Heart disease Paternal Aunt    Cancer Paternal Aunt        tumor behind eye   Diabetes Maternal Grandmother    Vision loss Maternal Grandmother    Heart disease Paternal Grandfather    Breast cancer Neg Hx     Objective: Office vital signs reviewed. BP 133/75   Pulse 72   Temp 98 F (36.7 C)   Ht 5\' 7"  (1.702 m)   Wt 232 lb (105.2 kg)   SpO2 92%   BMI 36.34 kg/m   Physical Examination:  General: Awake, alert, morbidly obese, No acute distress HEENT: Sclera white.  Moist mucous membranes Cardio: regular rate and rhythm, S1S2 heard, no murmurs appreciated Pulm: clear to auscultation bilaterally, no wheezes, rhonchi or rales; normal work of breathing on room air MSK: Ambulating independently but gait is antalgic.  She is able to get onto the medical table independently.  She has some loss of anatomy of the right knee due to body habitus.     12/20/2023     9:31 AM 11/23/2023    2:08 PM 11/09/2023    9:20 AM  Depression screen PHQ 2/9  Decreased Interest 0 0 0  Down, Depressed, Hopeless 0 0 0  PHQ - 2 Score 0 0 0  Altered sleeping 0 0 1  Tired, decreased energy 0  0 1  Change in appetite 0 0 0  Feeling bad or failure about yourself  0 0 1  Trouble concentrating 0 0 0  Moving slowly or fidgety/restless 0 0 0  Suicidal thoughts 0 0 0  PHQ-9 Score 0 0 3      12/20/2023    9:31 AM 11/23/2023    2:08 PM 11/09/2023    9:22 AM 09/27/2023    9:54 AM  GAD 7 : Generalized Anxiety Score  Nervous, Anxious, on Edge 0 0 1 1  Control/stop worrying 0 0 0 1  Worry too much - different things 0 0 1 0  Trouble relaxing 0 0 0 0  Restless 0 0 0 0  Easily annoyed or irritable 0 0 1 0  Afraid - awful might happen 0 0 1 0  Total GAD 7 Score 0 0 4 2  Anxiety Difficulty Not difficult at all  Somewhat difficult Not difficult at all   Assessment/ Plan: 73 y.o. female   Generalized anxiety disorder with panic attacks - Plan: busPIRone (BUSPAR) 7.5 MG tablet, sertraline (ZOLOFT) 50 MG tablet  Depression, major, single episode, mild (HCC) - Plan: busPIRone (BUSPAR) 7.5 MG tablet, sertraline (ZOLOFT) 50 MG tablet  Bilateral primary osteoarthritis of knee - Plan: Ambulatory referral to Orthopedic Surgery  Anxiety and depression are well-controlled with current regimen.  Refills of meds have been sent  I have referred her to orthopedics.  We discussed the potential for referral to physical therapy, injection therapy and/or surgical intervention.   Eliodoro Guerin, DO [facility] [phone]

## 2024-01-13 ENCOUNTER — Ambulatory Visit (INDEPENDENT_AMBULATORY_CARE_PROVIDER_SITE_OTHER)

## 2024-01-13 VITALS — BP 133/75 | HR 72 | Ht 67.0 in | Wt 232.0 lb

## 2024-01-13 DIAGNOSIS — Z0000 Encounter for general adult medical examination without abnormal findings: Secondary | ICD-10-CM | POA: Diagnosis not present

## 2024-01-13 NOTE — Progress Notes (Signed)
 Subjective:   Avriel Wujcik Lindamood is a 73 y.o. who presents for a Medicare Wellness preventive visit.  Visit Complete: Virtual I connected with  Haskell Linker E Moscoso on 01/13/24 by a audio enabled telemedicine application and verified that I am speaking with the correct person using two identifiers.  Patient Location: Home  Provider Location: Home Office  I discussed the limitations of evaluation and management by telemedicine. The patient expressed understanding and agreed to proceed.  Vital Signs: Because this visit was a virtual/telehealth visit, some criteria may be missing or patient reported. Any vitals not documented were not able to be obtained and vitals that have been documented are patient reported.  VideoDeclined- This patient declined Librarian, academic. Therefore the visit was completed with audio only.  Persons Participating in Visit: Patient.  AWV Questionnaire: No: Patient Medicare AWV questionnaire was not completed prior to this visit.  Cardiac Risk Factors include: advanced age (>29men, >37 women);obesity (BMI >30kg/m2);dyslipidemia;hypertension     Objective:    Today's Vitals   01/13/24 1002  BP: 133/75  Pulse: 72  Weight: 232 lb (105.2 kg)  Height: 5\' 7"  (1.702 m)   Body mass index is 36.34 kg/m.     01/13/2024   10:14 AM 09/23/2022    3:02 PM 09/22/2021    4:26 PM 09/19/2020    1:59 PM 09/19/2019    2:17 PM 09/15/2018    3:44 PM 12/29/2016    8:42 AM  Advanced Directives  Does Patient Have a Medical Advance Directive? No Yes Yes Yes No Yes No  Type of Advance Directive  Living will;Healthcare Power of [employer] Power of [place];Living will Living will  Living will   Does patient want to make changes to medical advance directive?  No - Patient declined  No - Patient declined  No - Patient declined   Copy of Healthcare Power of Attorney in Chart?  No - copy requested No - copy requested      Would patient like information on  creating a medical advance directive?     No - Patient declined  Yes (MAU/Ambulatory/Procedural Areas - Information given)    Current Medications (verified) Outpatient Encounter Medications as of 01/13/2024  Medication Sig   busPIRone  (BUSPAR ) 7.5 MG tablet Take 1 tablet (7.5 mg total) by mouth 3 (three) times daily. For anxiety/ panic   levocetirizine (XYZAL ) 5 MG tablet Take 1 tablet (5 mg total) by mouth every evening.   lisinopril  (ZESTRIL ) 20 MG tablet Take 1 tablet (20 mg total) by mouth daily.   sertraline  (ZOLOFT ) 50 MG tablet Take 1 tablet (50 mg total) by mouth daily.   simvastatin  (ZOCOR ) 20 MG tablet TAKE ONE TABLET DAILY AT 6PM   Vitamin D , Ergocalciferol , (DRISDOL ) 1.25 MG (50000 UNIT) CAPS capsule TAKE 1 CAPSULE (50,000 UNITS TOTAL) BY MOUTH EVERY 7 (SEVEN) DAYS   No facility-administered encounter medications on file as of 01/13/2024.    Allergies (verified) Bee venom   History: Past Medical History:  Diagnosis Date   Anxiety    Hyperlipidemia    Hypertension    Sleep apnea    Was on CPAP.   Does not use now.    Past Surgical History:  Procedure Laterality Date   ABDOMINAL HYSTERECTOMY     APPENDECTOMY     with malignancy of appendix   CHOLECYSTECTOMY     COLON SURGERY     preventative - with appendix malignancy   HERNIA REPAIR     Family  History  Problem Relation Age of Onset   Diabetes Mother    Hypertension Mother    Irregular heart beat Mother        pacemaker   Hearing loss Mother        right   Vision loss Mother        macular degeneration   Rheumatic fever Father        heart murmur   Pneumonia Father    Cancer - Cervical Sister    Heart disease Paternal Aunt    Cancer Paternal Aunt        tumor behind eye   Diabetes Maternal Grandmother    Vision loss Maternal Grandmother    Heart disease Paternal Grandfather    Breast cancer Neg Hx    Social History   Socioeconomic History   Marital status: Single    Spouse name: Not on file    Number of children: 0   Years of education: 16   Highest education level: Master's degree (e.g., MA, MS, MEng, MEd, MSW, MBA)  Occupational History   Occupation: Retired     Comment: Advice worker   Tobacco Use   Smoking status: Never   Smokeless tobacco: Never  Vaping Use   Vaping status: Never Used  Substance and Sexual Activity   Alcohol use: No   Drug use: No   Sexual activity: Not Currently    Birth control/protection: Surgical  Other Topics Concern   Not on file  Social History Narrative   Pt lives alone, pt is able to drive. Able to complete ADL'S    Took care of her mother until 13-Feb-2018 when she passed away, her father passed away when she was about 40 - so she retired early to care for him   Her brother and 2 nieces live close and visit frequently   Social Drivers of Corporate investment banker Strain: Low Risk  (01/13/2024)   Overall Financial Resource Strain (CARDIA)    Difficulty of Paying Living Expenses: Not hard at all  Food Insecurity: No Food Insecurity (01/13/2024)   Hunger Vital Sign    Worried About Running Out of Food in the Last Year: Never true    Ran Out of Food in the Last Year: Never true  Transportation Needs: No Transportation Needs (01/13/2024)   PRAPARE - Administrator, Civil Service (Medical): No    Lack of Transportation (Non-Medical): No  Physical Activity: Insufficiently Active (01/13/2024)   Exercise Vital Sign    Days of Exercise per Week: 2 days    Minutes of Exercise per Session: 20 min  Stress: No Stress Concern Present (01/13/2024)   [employer] of Occupational Health - Occupational Stress Questionnaire    Feeling of Stress : Not at all  Social Connections: Moderately Isolated (01/13/2024)   Social Connection and Isolation Panel [NHANES]    Frequency of Communication with Friends and Family: More than three times a week    Frequency of Social Gatherings with Friends and Family: More than three times a week    Attends  Religious Services: More than 4 times per year    Active Member of [employer] or Organizations: No    Attends Banker Meetings: Never    Marital Status: Never married    Tobacco Counseling Counseling given: Yes    Clinical Intake:  Pre-visit preparation completed: Yes  Pain : No/denies pain     BMI - recorded: 36.34 Nutritional Status: BMI > 30  Obese Nutritional Risks: None Diabetes: No  Lab Results  Component Value Date   HGBA1C 4.9 06/17/2023   HGBA1C 5.1 02/10/2023   HGBA1C 4.9 02/03/2022     How often do you need to have someone help you when you read instructions, pamphlets, or other written materials from your doctor or pharmacy?: 1 - Never  Interpreter Needed?: No  Information entered by :: Alia T/cma   Activities of Daily Living     01/13/2024   10:08 AM  In your present state of health, do you have any difficulty performing the following activities:  Hearing? 1  Vision? 0  Difficulty concentrating or making decisions? 1  Comment per pt pcp is aware  Walking or climbing stairs? 0  Dressing or bathing? 0  Doing errands, shopping? 0  Preparing Food and eating ? N  Using the Toilet? N  In the past six months, have you accidently leaked urine? N  Do you have problems with loss of bowel control? N  Managing your Medications? N  Managing your Finances? N  Housekeeping or managing your Housekeeping? N    Patient Care Team: Eliodoro Guerin, DO as PCP - General (Family Medicine) Johana Musty, OD (Optometry) Eilleen Grates, MD as Consulting Physician (Cardiology) Juanita Norlander, MD as Consulting Physician (General Surgery)  Indicate any recent Medical Services you may have received from other than [facility] providers in the past year (date may be approximate).     Assessment:    This is a routine wellness examination for [place].  Hearing/Vision screen Hearing Screening - Comments:: Pt stated sometimes, might be a little off Vision  Screening - Comments:: Pt denies vision dif   Goals Addressed             This Visit's Progress    COMPLETED: DIET - INCREASE WATER INTAKE       Try to drink 6-8 glasses of water daily     Patient Stated   On track    09/19/2020 AWV Goal: Exercise for General Health  Patient will verbalize understanding of the benefits of increased physical activity: Exercising regularly is important. It will improve your overall fitness, flexibility, and endurance. Regular exercise also will improve your overall health. It can help you control your weight, reduce stress, and improve your bone density. Over the next year, patient will increase physical activity as tolerated with a goal of at least 150 minutes of moderate physical activity per week.  You can tell that you are exercising at a moderate intensity if your heart starts beating faster and you start breathing faster but can still hold a conversation. Moderate-intensity exercise ideas include: Walking 1 mile (1.6 km) in about 15 minutes Biking Hiking Golfing Dancing Water aerobics Patient will verbalize understanding of everyday activities that increase physical activity by providing examples like the following: Yard work, such as: Insurance underwriter Gardening Washing windows or floors Patient will be able to explain general safety guidelines for exercising:  Before you start a new exercise program, talk with your health care provider. Do not exercise so much that you hurt yourself, feel dizzy, or get very short of breath. Wear comfortable clothes and wear shoes with good support. Drink plenty of water while you exercise to prevent dehydration or heat stroke. Work out until your breathing and your heartbeat get faster.      Patient Stated       Pt  getting her Adv. Directives done       Depression Screen     01/13/2024   10:20 AM 12/20/2023    9:31  AM 11/23/2023    2:08 PM 11/09/2023    9:20 AM 09/27/2023    9:53 AM 08/24/2023   10:15 AM 07/27/2023    8:36 AM  PHQ 2/9 Scores  PHQ - 2 Score 0 0 0 0 1 3 4   PHQ- 9 Score 1 0 0 3 3 7 14     Fall Risk     01/13/2024   10:04 AM 12/20/2023    9:30 AM 09/27/2023    9:46 AM 07/27/2023    8:30 AM 07/26/2023   11:17 AM  Fall Risk   Falls in the past year? 0 0 0 0 0  Number falls in past yr: 0 0 0 0   Injury with Fall? 0 0 0 0   Risk for fall due to : No Fall Risks Impaired balance/gait No Fall Risks No Fall Risks   Follow up Falls prevention discussed;Falls evaluation completed Falls evaluation completed Falls evaluation completed Education provided     MEDICARE RISK AT HOME:  Medicare Risk at Home Any stairs in or around the home?: Yes If so, are there any without handrails?: Yes Home free of loose throw rugs in walkways, pet beds, electrical cords, etc?: Yes Adequate lighting in your home to reduce risk of falls?: Yes Life alert?: No Use of a cane, walker or w/c?: Yes (uses a cane prn due knee pain) Grab bars in the bathroom?: Yes Shower chair or bench in shower?: Yes Elevated toilet seat or a handicapped toilet?: Yes  TIMED UP AND GO:  Was the test performed?  No  Cognitive Function: 6CIT completed    07/26/2023   11:41 AM 02/10/2023    9:52 AM 09/15/2018    4:12 PM 12/29/2016    8:49 AM  MMSE - Mini Mental State Exam  Orientation to time 5 5 5 5   Orientation to Place 5 5 5 5   Registration 3 3 3 3   Attention/ Calculation 5 5 5 5   Recall 0 3 3 3   Language- name 2 objects 2 2 2 2   Language- repeat 0 1 1 1   Language- follow 3 step command 3 3 3 3   Language- read & follow direction 1 1 1 1   Write a sentence 1 1 1 1   Copy design 1 1 1 1   Total score 26 30 30 30       10/25/2023   11:34 AM  Montreal Cognitive Assessment   Visuospatial/ Executive (0/5) 4  Naming (0/3) 3  Attention: Read list of digits (0/2) 2  Attention: Read list of letters (0/1) 1  Attention: Serial 7  subtraction starting at 100 (0/3) 3  Language: Repeat phrase (0/2) 2  Language : Fluency (0/1) 0  Abstraction (0/2) 2  Delayed Recall (0/5) 0  Orientation (0/6) 6  Total 23      01/13/2024   10:24 AM 09/23/2022    2:56 PM 09/19/2020    2:00 PM 09/19/2019    2:22 PM  6CIT Screen  What Year? 0 points 0 points 0 points 0 points  What month? 0 points 0 points 0 points 0 points  What time? 0 points 0 points 0 points 0 points  Count back from 20 0 points 0 points 0 points 0 points  Months in reverse 0 points 0 points 0 points 0 points  Repeat phrase 2  points 0 points 0 points 0 points  Total Score 2 points 0 points 0 points 0 points    Immunizations Immunization History  Administered Date(s) Administered   Moderna Sars-Covid-2 Vaccination 10/12/2019, 11/09/2019, 07/30/2020, 05/06/2021   PNEUMOCOCCAL CONJUGATE-20 02/09/2022   Tdap 05/02/2009    Screening Tests Health Maintenance  Topic Date Due   DTaP/Tdap/Td (2 - Td or Tdap) 07/26/2024 (Originally 05/03/2019)   Colonoscopy  08/23/2024 (Originally 11/21/2018)   Zoster Vaccines- Shingrix (1 of 2) 12/19/2024 (Originally 06/17/1970)   COVID-19 Vaccine (5 - 2024-25 season) 01/28/2025 (Originally 05/09/2023)   INFLUENZA VACCINE  04/07/2024   MAMMOGRAM  11/30/2024   Medicare Annual Wellness (AWV)  01/12/2025   DEXA SCAN  08/08/2027   Pneumonia Vaccine 74+ Years old  Completed   Hepatitis C Screening  Completed   HPV VACCINES  Aged Out   Meningococcal B Vaccine  Aged Out    Health Maintenance  There are no preventive care reminders to display for this patient.  Health Maintenance Items Addressed: See Nurse Notes  Additional Screening:  Vision Screening: Recommended annual ophthalmology exams for early detection of glaucoma and other disorders of the eye.  Dental Screening: Recommended annual dental exams for proper oral hygiene  Community Resource Referral / Chronic Care Management: CRR required this visit?  No   CCM  required this visit?  No     Plan:     I have personally reviewed and noted the following in the patient's chart:   Medical and social history Use of alcohol, tobacco or illicit drugs  Current medications and supplements including opioid prescriptions. Patient is not currently taking opioid prescriptions. Functional ability and status Nutritional status Physical activity Advanced directives List of other physicians Hospitalizations, surgeries, and ER visits in previous 12 months Vitals Screenings to include cognitive, depression, and falls Referrals and appointments  In addition, I have reviewed and discussed with patient certain preventive protocols, quality metrics, and best practice recommendations. A written personalized care plan for preventive services as well as general preventive health recommendations were provided to patient.     Michaelle Adolphus, CMA   01/13/2024   After Visit Summary: (Declined) Due to this being a telephonic visit, with patients personalized plan was offered to patient but patient Declined AVS at this time   Notes: n/a

## 2024-01-13 NOTE — Patient Instructions (Signed)
 Rhonda Daniel , Thank you for taking time to come for your Medicare Wellness Visit. I appreciate your ongoing commitment to your health goals. Please review the following plan we discussed and let me know if I can assist you in the future.   Referrals/Orders/Follow-Ups/Clinician Recommendations: n/a  This is a list of the screening recommended for you and due dates:  Health Maintenance  Topic Date Due   DTaP/Tdap/Td vaccine (2 - Td or Tdap) 07/26/2024*   Colon Cancer Screening  08/23/2024*   Zoster (Shingles) Vaccine (1 of 2) 12/19/2024*   COVID-19 Vaccine (5 - 2024-25 season) 01/28/2025*   Flu Shot  04/07/2024   Mammogram  11/30/2024   Medicare Annual Wellness Visit  01/12/2025   DEXA scan (bone density measurement)  08/08/2027   Pneumonia Vaccine  Completed   Hepatitis C Screening  Completed   HPV Vaccine  Aged Out   Meningitis B Vaccine  Aged Out  *Topic was postponed. The date shown is not the original due date.    Advanced directives: (Declined) Advance directive discussed with you today. Even though you declined this today, please call our office should you change your mind, and we can give you the proper paperwork for you to fill out.  Next Medicare Annual Wellness Visit scheduled for next year: Yes

## 2024-01-24 ENCOUNTER — Emergency Department (HOSPITAL_COMMUNITY)

## 2024-01-24 ENCOUNTER — Emergency Department (HOSPITAL_COMMUNITY)
Admission: EM | Admit: 2024-01-24 | Discharge: 2024-01-24 | Disposition: A | Discharge status: Home / Self Care | Attending: Emergency Medicine | Admitting: Emergency Medicine

## 2024-01-24 ENCOUNTER — Encounter (HOSPITAL_COMMUNITY): Payer: Self-pay

## 2024-01-24 ENCOUNTER — Other Ambulatory Visit: Payer: Self-pay

## 2024-01-24 DIAGNOSIS — S0081XA Abrasion of other part of head, initial encounter: Secondary | ICD-10-CM | POA: Diagnosis not present

## 2024-01-24 DIAGNOSIS — S50811A Abrasion of right forearm, initial encounter: Secondary | ICD-10-CM | POA: Diagnosis not present

## 2024-01-24 DIAGNOSIS — Y92480 Sidewalk as the place of occurrence of the external cause: Secondary | ICD-10-CM | POA: Insufficient documentation

## 2024-01-24 DIAGNOSIS — Z23 Encounter for immunization: Secondary | ICD-10-CM | POA: Insufficient documentation

## 2024-01-24 DIAGNOSIS — W01198A Fall on same level from slipping, tripping and stumbling with subsequent striking against other object, initial encounter: Secondary | ICD-10-CM | POA: Insufficient documentation

## 2024-01-24 DIAGNOSIS — I10 Essential (primary) hypertension: Secondary | ICD-10-CM | POA: Insufficient documentation

## 2024-01-24 DIAGNOSIS — S0003XA Contusion of scalp, initial encounter: Secondary | ICD-10-CM | POA: Diagnosis not present

## 2024-01-24 DIAGNOSIS — S0990XA Unspecified injury of head, initial encounter: Secondary | ICD-10-CM | POA: Diagnosis not present

## 2024-01-24 DIAGNOSIS — T148XXA Other injury of unspecified body region, initial encounter: Secondary | ICD-10-CM

## 2024-01-24 MED ORDER — TRIPLE ANTIBIOTIC 3.5-400-5000 EX OINT
TOPICAL_OINTMENT | Freq: Once | CUTANEOUS | Status: AC
  Administered 2024-01-24: 1 via CUTANEOUS
  Filled 2024-01-24: qty 1

## 2024-01-24 MED ORDER — TETANUS-DIPHTH-ACELL PERTUSSIS 5-2.5-18.5 LF-MCG/0.5 IM SUSY
0.5000 mL | PREFILLED_SYRINGE | Freq: Once | INTRAMUSCULAR | Status: AC
  Administered 2024-01-24: 0.5 mL via INTRAMUSCULAR
  Filled 2024-01-24: qty 0.5

## 2024-01-24 NOTE — ED Provider Notes (Signed)
 [place] EMERGENCY DEPARTMENT AT [facility] Provider Note   CSN: [id number] Arrival date & time: 01/24/24  1432     History  Chief Complaint  Patient presents with   Rhonda Daniel is a 73 y.o. female with a history of hypertension and hyperlipidemia presenting for evaluation of head injury and right forearm abrasion.  Approximately 1 PM today she was working in her flower garden when she tripped fell backwards and hit her posterior head on a sidewalk, she endorses actually falling onto grass however.  She abraded her right forearm which was dressed by EMS prior to arrival.  She denies any complaints of pain, she does have a mild headache, she denies vision changes, no nausea or vomiting, also denies neck pain, has no weakness or numbness in her extremities.  She is sore around the site of the abrasion itself but otherwise denies pain with movement of her extremities.  She is not current with her tetanus vaccine.  She has had no other treatment prior to arrival.  No anticoagulants.  The history is provided by the patient.       Home Medications Prior to Admission medications   Medication Sig Start Date End Date Taking? Authorizing Provider  busPIRone  (BUSPAR ) 7.5 MG tablet Take 1 tablet (7.5 mg total) by mouth 3 (three) times daily. For anxiety/ panic 12/20/23   Vicky Grange M, DO  levocetirizine (XYZAL ) 5 MG tablet Take 1 tablet (5 mg total) by mouth every evening. 09/10/23   Eliodoro Guerin, DO  lisinopril  (ZESTRIL ) 20 MG tablet Take 1 tablet (20 mg total) by mouth daily. 09/10/23   Eliodoro Guerin, DO  sertraline  (ZOLOFT ) 50 MG tablet Take 1 tablet (50 mg total) by mouth daily. 12/20/23   Eliodoro Guerin, DO  simvastatin  (ZOCOR ) 20 MG tablet TAKE ONE TABLET DAILY AT 6PM 09/10/23   Vicky Grange M, DO  Vitamin D , Ergocalciferol , (DRISDOL ) 1.25 MG (50000 UNIT) CAPS capsule TAKE 1 CAPSULE (50,000 UNITS TOTAL) BY MOUTH EVERY 7 (SEVEN) DAYS 11/23/23    Gottschalk, Ashly M, DO      Allergies    Bee venom    Review of Systems   Review of Systems  Constitutional:  Negative for chills and fever.  HENT:  Negative for congestion, facial swelling and sore throat.   Eyes: Negative.   Respiratory:  Negative for chest tightness and shortness of breath.   Cardiovascular:  Negative for chest pain.  Gastrointestinal:  Negative for abdominal pain, nausea and vomiting.  Genitourinary: Negative.   Musculoskeletal:  Negative for arthralgias, joint swelling and neck pain.  Skin:  Positive for wound. Negative for rash.  Neurological:  Negative for dizziness, weakness, light-headedness, numbness and headaches.  Psychiatric/Behavioral: Negative.      Physical Exam Updated Vital Signs BP (!) 145/62   Pulse 67   Temp 97.8 F (36.6 C) (Oral)   Resp 16   Ht 5\' 7"  (1.702 m)   Wt 105.2 kg   SpO2 98%   BMI 36.34 kg/m  Physical Exam Vitals and nursing note reviewed.  Constitutional:      Appearance: She is well-developed.  HENT:     Head: Normocephalic.      Comments: Small hematoma parietal scalp.  No bleeding. Eyes:     Conjunctiva/sclera: Conjunctivae normal.  Cardiovascular:     Rate and Rhythm: Normal rate and regular rhythm.     Heart sounds: Normal heart sounds.  Pulmonary:  Effort: Pulmonary effort is normal.     Breath sounds: Normal breath sounds. No wheezing.  Abdominal:     General: Bowel sounds are normal.     Palpations: Abdomen is soft.     Tenderness: There is no abdominal tenderness.  Musculoskeletal:        General: Normal range of motion.     Cervical back: Normal range of motion.  Skin:    General: Skin is warm and dry.     Findings: Erythema present.     Comments: Small abrasion right posterior proximal forearm.  Hemostatic.  Neurological:     Mental Status: She is alert.     ED Results / Procedures / Treatments   Labs (all labs ordered are listed, but only abnormal results are displayed) Labs Reviewed  - No data to display  EKG None  Radiology CT Head Wo Contrast Result Date: 01/24/2024 CLINICAL DATA:  Trip and fall EXAM: CT HEAD WITHOUT CONTRAST CT CERVICAL SPINE WITHOUT CONTRAST TECHNIQUE: Multidetector CT imaging of the head and cervical spine was performed following the standard protocol without intravenous contrast. Multiplanar CT image reconstructions of the cervical spine were also generated. RADIATION DOSE REDUCTION: This exam was performed according to the departmental dose-optimization program which includes automated exposure control, adjustment of the mA and/or kV according to patient size and/or use of iterative reconstruction technique. COMPARISON:  None Available. FINDINGS: CT HEAD FINDINGS Brain: No evidence of acute infarction, hemorrhage, hydrocephalus, extra-axial collection or mass lesion/mass effect. Vascular: No hyperdense vessel or unexpected calcification. Skull: Normal. Negative for fracture or focal lesion. Sinuses/Orbits: No acute finding. Other: Soft tissue contusion of the scalp vertex (series 4, image 48). CT CERVICAL SPINE FINDINGS Alignment: Normal. Skull base and vertebrae: No acute fracture. No primary bone lesion or focal pathologic process. Soft tissues and spinal canal: No prevertebral fluid or swelling. No visible canal hematoma. Disc levels: Focally moderate multilevel disc space height loss and osteophytosis, worst from C4-C7, notable for sizable posterior disc osteophytes at C5-C6 (series 6, image 28). Upper chest: Negative. Other: None. IMPRESSION: 1. No acute intracranial pathology. 2. Soft tissue contusion of the scalp vertex. 3. No fracture or static subluxation of the cervical spine. 4. Focally moderate multilevel cervical disc degenerative disease, worst from C4-C7, notable for sizable posterior disc osteophytes at C5-C6. Cervical stenosis may be further assessed by MRI if desired. Electronically Signed   By: Fredricka Jenny M.D.   On: 01/24/2024 16:24   CT  Cervical Spine Wo Contrast Result Date: 01/24/2024 CLINICAL DATA:  Trip and fall EXAM: CT HEAD WITHOUT CONTRAST CT CERVICAL SPINE WITHOUT CONTRAST TECHNIQUE: Multidetector CT imaging of the head and cervical spine was performed following the standard protocol without intravenous contrast. Multiplanar CT image reconstructions of the cervical spine were also generated. RADIATION DOSE REDUCTION: This exam was performed according to the departmental dose-optimization program which includes automated exposure control, adjustment of the mA and/or kV according to patient size and/or use of iterative reconstruction technique. COMPARISON:  None Available. FINDINGS: CT HEAD FINDINGS Brain: No evidence of acute infarction, hemorrhage, hydrocephalus, extra-axial collection or mass lesion/mass effect. Vascular: No hyperdense vessel or unexpected calcification. Skull: Normal. Negative for fracture or focal lesion. Sinuses/Orbits: No acute finding. Other: Soft tissue contusion of the scalp vertex (series 4, image 48). CT CERVICAL SPINE FINDINGS Alignment: Normal. Skull base and vertebrae: No acute fracture. No primary bone lesion or focal pathologic process. Soft tissues and spinal canal: No prevertebral fluid or swelling. No visible canal hematoma.  Disc levels: Focally moderate multilevel disc space height loss and osteophytosis, worst from C4-C7, notable for sizable posterior disc osteophytes at C5-C6 (series 6, image 28). Upper chest: Negative. Other: None. IMPRESSION: 1. No acute intracranial pathology. 2. Soft tissue contusion of the scalp vertex. 3. No fracture or static subluxation of the cervical spine. 4. Focally moderate multilevel cervical disc degenerative disease, worst from C4-C7, notable for sizable posterior disc osteophytes at C5-C6. Cervical stenosis may be further assessed by MRI if desired. Electronically Signed   By: Fredricka Jenny M.D.   On: 01/24/2024 16:24    Procedures Procedures    Medications  Ordered in ED Medications  neomycin -bacitracin -polymyxin [phone] OINT (has no administration in time range)  Tdap (BOOSTRIX ) injection 0.5 mL (0.5 mLs Intramuscular Given 01/24/24 1529)    ED Course/ Medical Decision Making/ A&P                                 Medical Decision Making Pt with fall/head injury prior to arrival, no loc, no anticoagulant use.  Patient is alert and oriented with no neurodeficits on exam.  No nausea or vomiting.  Given age and nature of fall, CT imaging was completed as outlined below.  She was given instructions for wound care, abrasion right forearm, hemostatic.  Tetanus was updated.  She was given minor head injury instructions, plan parent follow-up with her PCP if symptoms are not completely resolved over the next week.  Amount and/or Complexity of Data Reviewed Radiology: ordered.    Details: CT head and C-spine completed and negative for acute findings.  Risk OTC drugs. Prescription drug management.           Final Clinical Impression(s) / ED Diagnoses Final diagnoses:  Minor head injury, initial encounter  Abrasion    Rx / DC Orders ED Discharge Orders     None         Alyse July 01/24/24 1654    Auston Blush, MD 01/25/24 [phone]

## 2024-01-24 NOTE — Discharge Instructions (Signed)
 Your CT scans are negative for acute injury as discussed.  Refer to the head injury instructions below, and get rechecked by your primary provider if you have any symptoms that are not resolved over the next week.

## 2024-01-24 NOTE — ED Notes (Signed)
 Patient transported to CT

## 2024-01-24 NOTE — ED Triage Notes (Signed)
 Pt arrived via [facility] from home following a fall outside her home. Pt reports she was in her garden and tripped over a landscaping brick and Pt reports hitting the back of her head on the cement sidewalk. Pt denies LOC.

## 2024-01-24 NOTE — ED Triage Notes (Signed)
 Pt does present with skin abrasion to right elbow. Bleeding under control at this time.

## 2024-01-25 ENCOUNTER — Telehealth: Payer: Self-pay | Admitting: Family Medicine

## 2024-01-25 DIAGNOSIS — F410 Panic disorder [episodic paroxysmal anxiety] without agoraphobia: Secondary | ICD-10-CM

## 2024-01-25 DIAGNOSIS — F320 Major depressive disorder, single episode, mild: Secondary | ICD-10-CM

## 2024-01-25 MED ORDER — BUSPIRONE HCL 5 MG PO TABS: 5.0000 mg | ORAL_TABLET | Freq: Three times a day (TID) | ORAL | 3 refills | Status: DC

## 2024-01-25 NOTE — Telephone Encounter (Signed)
 Called patient and can not get an answer states number is not working

## 2024-01-25 NOTE — Telephone Encounter (Signed)
5 mg sent

## 2024-01-25 NOTE — Telephone Encounter (Signed)
  Prescription Request  01/25/2024  Is this a "Controlled Substance" medicine? no  Have you seen your PCP in the last 2 weeks? no  If YES, route message to pool  -  If NO, patient needs to be scheduled for appointment.  What is the name of the medication or equipment? busPIRone  (BUSPAR ) 5 MG tablet    Have you contacted your pharmacy to request a refill? yes   Which pharmacy would you like this sent to? [facility] Pt would like this dose since it is not as strong 7mg , she has discussed this with DR. G. Before. Pt is also taking 7mg  at night    Patient notified that their request is being sent to the clinical staff for review and that they should receive a response within 2 business days.

## 2024-01-27 NOTE — Telephone Encounter (Signed)
 2nd attempt - phone just keeps ringing - no vm   when patient calls back please relay message to patient ( medication sent to pharmacy )

## 2024-01-28 ENCOUNTER — Telehealth: Payer: Self-pay | Admitting: Family Medicine

## 2024-02-02 ENCOUNTER — Telehealth: Payer: Self-pay | Admitting: Family Medicine

## 2024-02-02 NOTE — Telephone Encounter (Signed)
 Spoke with Haskell Linker and explained that Dr. Bonnell Butcher does not need to sign the paperwork for the [facility].  Dr. Crissie Dome brought me the paperwork back and I have placed it in the drawer for Haskell Linker to pick up and complete the necessary signatures and notarizing of the document.  Once this is completed, she will bring back for me to scan into her chart.

## 2024-02-02 NOTE — Telephone Encounter (Signed)
 This has not been located by myself, her PCP or the front office supervisor.

## 2024-03-17 ENCOUNTER — Encounter: Payer: Self-pay | Admitting: *Deleted

## 2024-04-19 NOTE — Progress Notes (Signed)
 Subjective: CC:GAD/ Depression PCP: [name] HERO, DO Rhonda Daniel is a 73 y.o. female presenting to clinic today for:  1. GAD/ Depression Reports stability of anxiety and depression.  She occasionally gets down but is not very often and she feels pleased with her medications.  Continues on Zoloft  and BuSpar  as directed  2.  Right knee instability Patient reports ongoing right knee instability and occasional pain with pivoting motions when she is rolling over in bed.  She is mostly worried about falling because it becomes unstable and utilizes cane but does not leave the home very often because she is afraid of falling.  She has excellent support by her brother, sister-in-law and niece.  3.  Hypertension associated with morbid obesity   She is compliant with medications.  Just took her lisinopril  about half an hour prior to arrival.  She denies any chest pain, shortness of breath, falls.  She is reported some weight gain since her last visit but overall still down net weight   ROS: Per HPI  Allergies  Allergen Reactions   Bee Venom     Local redness and swelling    Past Medical History:  Diagnosis Date   Anxiety    Hyperlipidemia    Hypertension    Sleep apnea    Was on CPAP.   Does not use now.     Current Outpatient Medications:    busPIRone  (BUSPAR ) 5 MG tablet, Take 1 tablet (5 mg total) by mouth 3 (three) times daily. For anxiety/ panic, Disp: 270 tablet, Rfl: 3   levocetirizine (XYZAL ) 5 MG tablet, Take 1 tablet (5 mg total) by mouth every evening., Disp: 90 tablet, Rfl: 3   lisinopril  (ZESTRIL ) 20 MG tablet, Take 1 tablet (20 mg total) by mouth daily., Disp: 90 tablet, Rfl: 3   sertraline  (ZOLOFT ) 50 MG tablet, Take 1 tablet (50 mg total) by mouth daily., Disp: 90 tablet, Rfl: 4   simvastatin  (ZOCOR ) 20 MG tablet, TAKE ONE TABLET DAILY AT 6PM, Disp: 90 tablet, Rfl: 3   Vitamin D , Ergocalciferol , (DRISDOL ) 1.25 MG (50000 UNIT) CAPS capsule, TAKE 1 CAPSULE  (50,000 UNITS TOTAL) BY MOUTH EVERY 7 (SEVEN) DAYS, Disp: 12 capsule, Rfl: 1 Social History   Socioeconomic History   Marital status: Single    Spouse name: Not on file   Number of children: 0   Years of education: 16   Highest education level: Master's degree (e.g., MA, MS, MEng, MEd, MSW, MBA)  Occupational History   Occupation: Retired     Comment: Advice worker   Tobacco Use   Smoking status: Never   Smokeless tobacco: Never  Vaping Use   Vaping status: Never Used  Substance and Sexual Activity   Alcohol use: No   Drug use: No   Sexual activity: Not Currently    Birth control/protection: Surgical  Other Topics Concern   Not on file  Social History Narrative   Pt lives alone, pt is able to drive. Able to complete ADL'S    Took care of her mother until May 09, 2018 when she passed away, her father passed away when she was about 28 - so she retired early to care for him   Her brother and 2 nieces live close and visit frequently   Social Drivers of Corporate investment banker Strain: Low Risk  (01/13/2024)   Overall Financial Resource Strain (CARDIA)    Difficulty of Paying Living Expenses: Not hard at all  Food Insecurity: No Food  Insecurity (01/13/2024)   Hunger Vital Sign    Worried About Running Out of Food in the Last Year: Never true    Ran Out of Food in the Last Year: Never true  Transportation Needs: No Transportation Needs (01/13/2024)   PRAPARE - Administrator, Civil Service (Medical): No    Lack of Transportation (Non-Medical): No  Physical Activity: Insufficiently Active (01/13/2024)   Exercise Vital Sign    Days of Exercise per Week: 2 days    Minutes of Exercise per Session: 20 min  Stress: No Stress Concern Present (01/13/2024)   [employer] of Occupational Health - Occupational Stress Questionnaire    Feeling of Stress : Not at all  Social Connections: Moderately Isolated (01/13/2024)   Social Connection and Isolation Panel    Frequency of  Communication with Friends and Family: More than three times a week    Frequency of Social Gatherings with Friends and Family: More than three times a week    Attends Religious Services: More than 4 times per year    Active Member of [employer] or Organizations: No    Attends Banker Meetings: Never    Marital Status: Never married  Intimate Partner Violence: Not At Risk (01/13/2024)   Humiliation, Afraid, Rape, and Kick questionnaire    Fear of Current or Ex-Partner: No    Emotionally Abused: No    Physically Abused: No    Sexually Abused: No   Family History  Problem Relation Age of Onset   Diabetes Mother    Hypertension Mother    Irregular heart beat Mother        pacemaker   Hearing loss Mother        right   Vision loss Mother        macular degeneration   Rheumatic fever Father        heart murmur   Pneumonia Father    Cancer - Cervical Sister    Heart disease Paternal Aunt    Cancer Paternal Aunt        tumor behind eye   Diabetes Maternal Grandmother    Vision loss Maternal Grandmother    Heart disease Paternal Grandfather    Breast cancer Neg Hx     Objective: Office vital signs reviewed. BP (!) 164/82   Pulse (!) 57   Temp 97.9 F (36.6 C)   Ht 5' 4 (1.626 m)   Wt 237 lb 6 oz (107.7 kg)   SpO2 93%   BMI 40.75 kg/m   Physical Examination:  General: Awake, alert, morbid obesity, No acute distress HEENT: Sclera white.  Moist mucous membranes Cardio: regular rate and rhythm, S1S2 heard, no murmurs appreciated Pulm: clear to auscultation bilaterally, no wheezes, rhonchi or rales; normal work of breathing on room air MSK: Ambulates with use of cane.  Mild tenderness palpation to the medial right knee.  Has marked varicose veins throughout bilateral lower extremities with hemosiderosis of bilateral lower extremities below the calf.     04/21/2024    9:24 AM 01/13/2024   10:20 AM 12/20/2023    9:31 AM  Depression screen PHQ 2/9  Decreased Interest 0  0 0  Down, Depressed, Hopeless 0 0 0  PHQ - 2 Score 0 0 0  Altered sleeping 0 0 0  Tired, decreased energy 0 0 0  Change in appetite 0 0 0  Feeling bad or failure about yourself  0 0 0  Trouble concentrating 0 0 0  Moving slowly or fidgety/restless 1 1 0  Suicidal thoughts 0 0 0  PHQ-9 Score 1 1 0  Difficult doing work/chores Somewhat difficult        04/21/2024    9:25 AM 12/20/2023    9:31 AM 11/23/2023    2:08 PM 11/09/2023    9:22 AM  GAD 7 : Generalized Anxiety Score  Nervous, Anxious, on Edge 0 0 0 1  Control/stop worrying 1 0 0 0  Worry too much - different things 0 0 0 1  Trouble relaxing 0 0 0 0  Restless 0 0 0 0  Easily annoyed or irritable 0 0 0 1  Afraid - awful might happen 0 0 0 1  Total GAD 7 Score 1 0 0 4  Anxiety Difficulty Somewhat difficult Not difficult at all  Somewhat difficult    Assessment/ Plan: 73 y.o. female   Generalized anxiety disorder with panic attacks  Depression, major, single episode, mild (HCC)  Chronic pain of right knee - Plan: Ambulatory referral to Orthopedic Surgery  Hemosiderosis of lower extremity due to venous insufficiency  Essential hypertension  Anxiety depression are stable with current regimen.  No changes  Referral to orthopedics for what is likely a meniscal issue given reports of instability.  Would like to have any type of physical therapies done here in [place] locally  Discussed what hemosiderosis of the lower extremities or and encouraged her to continue elevation of lower extremities to reduce swelling  Blood pressure not at goal currently.  We will plan to repeat blood pressure in 2 weeks because she had just utilized her blood pressure medicine prior to arrival its likely just not in the bloodstream yet.  She is not having any symptomology.  She may otherwise follow-up in 6 months for annual physical with fasting labs, sooner if concerns arise   Raygen Dahm CHRISTELLA Fielding, DO [facility] [phone]

## 2024-04-21 ENCOUNTER — Encounter: Payer: Self-pay | Admitting: Family Medicine

## 2024-04-21 ENCOUNTER — Ambulatory Visit (INDEPENDENT_AMBULATORY_CARE_PROVIDER_SITE_OTHER): Admitting: Family Medicine

## 2024-04-21 VITALS — BP 164/82 | HR 57 | Temp 97.9°F | Ht 64.0 in | Wt 237.4 lb

## 2024-04-21 DIAGNOSIS — F410 Panic disorder [episodic paroxysmal anxiety] without agoraphobia: Secondary | ICD-10-CM | POA: Diagnosis not present

## 2024-04-21 DIAGNOSIS — I10 Essential (primary) hypertension: Secondary | ICD-10-CM

## 2024-04-21 DIAGNOSIS — F411 Generalized anxiety disorder: Secondary | ICD-10-CM | POA: Diagnosis not present

## 2024-04-21 DIAGNOSIS — F320 Major depressive disorder, single episode, mild: Secondary | ICD-10-CM | POA: Diagnosis not present

## 2024-04-21 DIAGNOSIS — M25561 Pain in right knee: Secondary | ICD-10-CM | POA: Diagnosis not present

## 2024-04-21 DIAGNOSIS — G8929 Other chronic pain: Secondary | ICD-10-CM | POA: Diagnosis not present

## 2024-04-21 DIAGNOSIS — I872 Venous insufficiency (chronic) (peripheral): Secondary | ICD-10-CM

## 2024-05-05 ENCOUNTER — Ambulatory Visit

## 2024-05-09 ENCOUNTER — Other Ambulatory Visit: Payer: Self-pay | Admitting: Family Medicine

## 2024-05-09 DIAGNOSIS — E559 Vitamin D deficiency, unspecified: Secondary | ICD-10-CM

## 2024-05-11 ENCOUNTER — Other Ambulatory Visit: Payer: Self-pay | Admitting: Family Medicine

## 2024-05-11 ENCOUNTER — Ambulatory Visit (INDEPENDENT_AMBULATORY_CARE_PROVIDER_SITE_OTHER)

## 2024-05-11 DIAGNOSIS — E559 Vitamin D deficiency, unspecified: Secondary | ICD-10-CM

## 2024-05-11 NOTE — Progress Notes (Signed)
 Patient is in office today for a blood pressure check. Patient blood pressure was 149/83, patient is not having any cardiac symptoms. Rechecked patient blood pressure 3 minutes later, reading was 129/79.

## 2024-05-25 DIAGNOSIS — M1711 Unilateral primary osteoarthritis, right knee: Secondary | ICD-10-CM | POA: Diagnosis not present

## 2024-05-31 ENCOUNTER — Ambulatory Visit: Attending: Physician Assistant | Admitting: Physical Therapy

## 2024-05-31 ENCOUNTER — Encounter: Payer: Self-pay | Admitting: Physical Therapy

## 2024-05-31 ENCOUNTER — Other Ambulatory Visit: Payer: Self-pay

## 2024-05-31 DIAGNOSIS — M25661 Stiffness of right knee, not elsewhere classified: Secondary | ICD-10-CM | POA: Insufficient documentation

## 2024-05-31 DIAGNOSIS — R600 Localized edema: Secondary | ICD-10-CM | POA: Diagnosis not present

## 2024-05-31 DIAGNOSIS — G8929 Other chronic pain: Secondary | ICD-10-CM | POA: Insufficient documentation

## 2024-05-31 DIAGNOSIS — M25561 Pain in right knee: Secondary | ICD-10-CM | POA: Insufficient documentation

## 2024-05-31 NOTE — Therapy (Signed)
 OUTPATIENT PHYSICAL THERAPY LOWER EXTREMITY EVALUATION   Patient Name: Rhonda Daniel MRN: [id number] DOB:[birth year], 73 y.o., female Today's Date: 05/31/2024  END OF SESSION:  PT End of Session - 05/31/24 1242     Visit Number 1    Number of Visits 12    Date for Recertification  07/12/24    PT Start Time 1102    PT Stop Time 1155    PT Time Calculation (min) 53 min    Activity Tolerance Patient tolerated treatment well    Behavior During Therapy WFL for tasks assessed/performed          Past Medical History:  Diagnosis Date   Anxiety    Hyperlipidemia    Hypertension    Sleep apnea    Was on CPAP.   Does not use now.    Past Surgical History:  Procedure Laterality Date   ABDOMINAL HYSTERECTOMY     APPENDECTOMY     with malignancy of appendix   CHOLECYSTECTOMY     COLON SURGERY     preventative - with appendix malignancy   HERNIA REPAIR     Patient Active Problem List   Diagnosis Date Noted   Generalized anxiety disorder with panic attacks 07/27/2023   Caregiver with fatigue 01/14/2017   Hyperlipidemia 04/02/2015   Morbid obesity (HCC) 08/06/2009   ESSENTIAL HYPERTENSION, BENIGN 08/06/2009   Sleep apnea 08/06/2009   Nonspecific abnormal results of cardiovascular function study 08/06/2009   REFERRING PROVIDER: Dickey Sprague PA-C  REFERRING DIAG: Right knee pain and arthritis.    THERAPY DIAG:  Chronic pain of right knee - Plan: PT plan of care cert/re-cert  Stiffness of right knee, not elsewhere classified - Plan: PT plan of care cert/re-cert  Localized edema - Plan: PT plan of care cert/re-cert  Rationale for Evaluation and Treatment: Rehabilitation  ONSET DATE: ~one year.   SUBJECTIVE:   SUBJECTIVE STATEMENT: The patient presents to the clinic with c/o worsening right knee pain over the last year.  She rates her pain at a 5-6/10 today that can rise to higher levels with increased up time and climbing stairs.  Sitting decreases her pain.  She  uses a cane as it makes her feel more stable.  She describes her pain as an ache.    PERTINENT HISTORY: See above.   PAIN:  Are you having pain? Yes: NPRS scale: 5-6/10.   Pain location: Right knee.   Pain description: As above.   Aggravating factors: As above.   Relieving factors: As above.    PRECAUTIONS: Other: No pain increasing therex (O and CKC).    RED FLAGS: None   WEIGHT BEARING RESTRICTIONS: No  FALLS:  Has patient fallen in last 6 months? No  LIVING ENVIRONMENT: Lives in: House/apartment Has following equipment at home: Single point cane  OCCUPATION: Retired.    PLOF: Independent and Independent with household mobility with device  PATIENT GOALS: Reduce pain.    OBJECTIVE:   PATIENT SURVEYS:  LEFS:  39/80.    EDEMA:  Circumferential: 3 cms greater on right than left.     POSTURE: Right lateral patellar tilt.    PALPATION: Patient's CC is tenderness in the region of her right knee lateral joint line.  LOWER EXTREMITY ROM:  Full right knee extension and flexion to 100 degrees.  Notable crepitus.    LOWER EXTREMITY MMT:  The patient exhibits a solid 4+/5 right hip and knee extension strength grade via manual muscle testing.    LOWER EXTREMITY  SPECIAL TESTS:  Right knee exhibits good stability.  Significant decrease in right patellar mobility.      GAIT: Gait antalgia noted.  Patient is walking safely with a straight cane.                                                                                                                                 TREATMENT DATE: 05/31/24:    LE elevation and vasopneumatic on low and IFC at 80-150 Hz on 40% scan x 20 minutes to patient's right knee.  Normal modality resposne following removal of modality.   PATIENT EDUCATION:  Education details: Discussed therapeutic interventions to help decrease pain and therex that avoid pain increases.   Person educated: Patient Education method: Explanation Education  comprehension: verbalized understanding  HOME EXERCISE PROGRAM:   ASSESSMENT:  CLINICAL IMPRESSION: The patient presents to [facility] with c/o chronic right knee pain.  She c/o tenderness in the region of her right knee lateral joint line.  Her right patella exhibits a lateral tilt and she has a significant loss of patella mobility.  She has notable right knee crepitus.  Her knee is stable.  She walks safely with a straight cane.  Her LEFS score is 39/80.  Patient will benefit from skilled physical therapy intervention to address pain and deficits.  ACTIVITY LIMITATIONS: carrying, lifting, bending, standing, stairs, and locomotion level  PARTICIPATION LIMITATIONS: meal prep, cleaning, laundry, community activity, and yard work  PERSONAL FACTORS: Time since onset of injury/illness/exacerbation are also affecting patient's functional outcome.   REHAB POTENTIAL: Good  CLINICAL DECISION MAKING: Evolving/moderate complexity  EVALUATION COMPLEXITY: Low   GOALS:  SHORT TERM GOALS: Target date: 06/14/24  Ind with an initial HEP. Goal status: INITIAL   LONG TERM GOALS: Target date: 07/12/24.  Ind with an advanced HEP.  Goal status: INITIAL  2.  Perform ADL's with right knee pain not > 3/10.  Goal status: INITIAL  3.  Perform a reciprocating stair gait with on railing with pain not > 3/10.  Goal status: INITIAL  4.  Improve LEFS score by at least 10 points.  Goal status: INITIAL  PLAN:  PT FREQUENCY: 2x/week  PT DURATION: 6 weeks  PLANNED INTERVENTIONS: 97110-Therapeutic exercises, 97530- Therapeutic activity, [id number]- Neuromuscular re-education, 97535- Self Care, [id number]- Manual therapy, G0283- Electrical stimulation (unattended), 97016- Vasopneumatic device, 97035- Ultrasound, Patient/Family education, Cryotherapy, and Moist heat  PLAN FOR NEXT SESSION: Patellar mobility, Nustep, no pain increase therex (O and CKC).     Caellum Mancil, [place], PT 05/31/2024, 2:44 PM

## 2024-06-02 ENCOUNTER — Ambulatory Visit: Admitting: Physical Therapy

## 2024-06-02 ENCOUNTER — Encounter: Payer: Self-pay | Admitting: Physical Therapy

## 2024-06-02 DIAGNOSIS — R600 Localized edema: Secondary | ICD-10-CM

## 2024-06-02 DIAGNOSIS — M25661 Stiffness of right knee, not elsewhere classified: Secondary | ICD-10-CM

## 2024-06-02 DIAGNOSIS — G8929 Other chronic pain: Secondary | ICD-10-CM

## 2024-06-02 DIAGNOSIS — M25561 Pain in right knee: Secondary | ICD-10-CM | POA: Diagnosis not present

## 2024-06-02 NOTE — Therapy (Signed)
 OUTPATIENT PHYSICAL THERAPY LOWER EXTREMITY TREATMENT   Patient Name: Rhonda Daniel MRN: [id number] DOB:[birth year], 73 y.o., female Today's Date: 06/02/2024  END OF SESSION:  PT End of Session - 06/02/24 0927     Visit Number 2    Number of Visits 12    Date for Recertification  07/12/24    Authorization Type [employer]    PT Start Time 0930    PT Stop Time 1010    PT Time Calculation (min) 40 min    Activity Tolerance Patient tolerated treatment well    Behavior During Therapy WFL for tasks assessed/performed           Past Medical History:  Diagnosis Date   Anxiety    Hyperlipidemia    Hypertension    Sleep apnea    Was on CPAP.   Does not use now.    Past Surgical History:  Procedure Laterality Date   ABDOMINAL HYSTERECTOMY     APPENDECTOMY     with malignancy of appendix   CHOLECYSTECTOMY     COLON SURGERY     preventative - with appendix malignancy   HERNIA REPAIR     Patient Active Problem List   Diagnosis Date Noted   Generalized anxiety disorder with panic attacks 07/27/2023   Caregiver with fatigue 01/14/2017   Hyperlipidemia 04/02/2015   Morbid obesity (HCC) 08/06/2009   ESSENTIAL HYPERTENSION, BENIGN 08/06/2009   Sleep apnea 08/06/2009   Nonspecific abnormal results of cardiovascular function study 08/06/2009   REFERRING PROVIDER: Dickey Sprague PA-C  REFERRING DIAG: Right knee pain and arthritis.    THERAPY DIAG:  Chronic pain of right knee  Stiffness of right knee, not elsewhere classified  Localized edema  Rationale for Evaluation and Treatment: Rehabilitation  ONSET DATE: ~one year.   SUBJECTIVE:   SUBJECTIVE STATEMENT: Pt states she has had no pain since last session.   PERTINENT HISTORY: See above.   PAIN:  Are you having pain? Yes: NPRS scale: 0/10.   Pain location: Right knee.   Pain description: As above.   Aggravating factors: As above.   Relieving factors: As above.    PRECAUTIONS: Other: No pain increasing  therex (O and CKC).    RED FLAGS: None   WEIGHT BEARING RESTRICTIONS: No  FALLS:  Has patient fallen in last 6 months? No  LIVING ENVIRONMENT: Lives in: House/apartment Has following equipment at home: Single point cane  OCCUPATION: Retired.    PLOF: Independent and Independent with household mobility with device  PATIENT GOALS: Reduce pain.    OBJECTIVE:   PATIENT SURVEYS:  LEFS:  39/80.    EDEMA:  Circumferential: 3 cms greater on right than left.     POSTURE: Right lateral patellar tilt.    PALPATION: Patient's CC is tenderness in the region of her right knee lateral joint line.  LOWER EXTREMITY ROM: Full right knee extension and flexion to 100 degrees.  Notable crepitus.    LOWER EXTREMITY MMT: The patient exhibits a solid 4+/5 right hip and knee extension strength grade via manual muscle testing.    LOWER EXTREMITY SPECIAL TESTS:  Right knee exhibits good stability.  Significant decrease in right patellar mobility.    GAIT: Gait antalgia noted.  Patient is walking safely with a straight cane.  TREATMENT DATE:  06/02/24 Nustep L2 x 10 min UEs/LEs Supine ankle pumps x20 Manual therapy: patellar mobilization, quad STM & TPR Supine SAQ x20 Supine heel slide x20 Supine hip abd x20 Modalities: LE elevated, vasopneumatic on low and IFC at 80-150 Hz on 40% scan x 20 minutes to patient's right knee.  Normal modality resposne following removal of modality.    05/31/24:    LE elevation and vasopneumatic on low and IFC at 80-150 Hz on 40% scan x 20 minutes to patient's right knee.  Normal modality resposne following removal of modality.   PATIENT EDUCATION:  Education details: Discussed therapeutic interventions to help decrease pain and therex that avoid pain increases.   Person educated: Patient Education method:  Explanation Education comprehension: verbalized understanding  HOME EXERCISE PROGRAM:   ASSESSMENT:  CLINICAL IMPRESSION: Treatment focused on improving patellar mobility and initiating knee ROM and strengthening. Had a great response to vaso and estim from last session in regards to controlling her pain so continued this session.   ACTIVITY LIMITATIONS: carrying, lifting, bending, standing, stairs, and locomotion level  PARTICIPATION LIMITATIONS: meal prep, cleaning, laundry, community activity, and yard work  PERSONAL FACTORS: Time since onset of injury/illness/exacerbation are also affecting patient's functional outcome.   REHAB POTENTIAL: Good  CLINICAL DECISION MAKING: Evolving/moderate complexity  EVALUATION COMPLEXITY: Low   GOALS:  SHORT TERM GOALS: Target date: 06/14/24  Ind with an initial HEP. Goal status: INITIAL   LONG TERM GOALS: Target date: 07/12/24.  Ind with an advanced HEP.  Goal status: INITIAL  2.  Perform ADL's with right knee pain not > 3/10.  Goal status: INITIAL  3.  Perform a reciprocating stair gait with on railing with pain not > 3/10.  Goal status: INITIAL  4.  Improve LEFS score by at least 10 points.  Goal status: INITIAL  PLAN:  PT FREQUENCY: 2x/week  PT DURATION: 6 weeks  PLANNED INTERVENTIONS: 97110-Therapeutic exercises, 97530- Therapeutic activity, [id number]- Neuromuscular re-education, 97535- Self Care, 02859- Manual therapy, G0283- Electrical stimulation (unattended), 97016- Vasopneumatic device, 97035- Ultrasound, Patient/Family education, Cryotherapy, and Moist heat  PLAN FOR NEXT SESSION: Patellar mobility, Nustep, no pain increase therex (O and CKC).     Geza Beranek April Ma L Adylee Leonardo, PT 06/02/2024, 9:28 AM

## 2024-06-05 ENCOUNTER — Ambulatory Visit

## 2024-06-05 DIAGNOSIS — M25661 Stiffness of right knee, not elsewhere classified: Secondary | ICD-10-CM

## 2024-06-05 DIAGNOSIS — R600 Localized edema: Secondary | ICD-10-CM

## 2024-06-05 DIAGNOSIS — G8929 Other chronic pain: Secondary | ICD-10-CM

## 2024-06-05 DIAGNOSIS — M25561 Pain in right knee: Secondary | ICD-10-CM | POA: Diagnosis not present

## 2024-06-05 NOTE — Therapy (Signed)
 OUTPATIENT PHYSICAL THERAPY LOWER EXTREMITY TREATMENT   Patient Name: Rhonda Daniel MRN: [id number] DOB:09-14-1950, 73 y.o., female Today's Date: 06/05/2024  END OF SESSION:  PT End of Session - 06/05/24 1103     Visit Number 3    Number of Visits 12    Date for Recertification  07/12/24    Authorization Type [employer]    PT Start Time 1100    PT Stop Time 1150    PT Time Calculation (min) 50 min    Activity Tolerance Patient tolerated treatment well    Behavior During Therapy WFL for tasks assessed/performed           Past Medical History:  Diagnosis Date   Anxiety    Hyperlipidemia    Hypertension    Sleep apnea    Was on CPAP.   Does not use now.    Past Surgical History:  Procedure Laterality Date   ABDOMINAL HYSTERECTOMY     APPENDECTOMY     with malignancy of appendix   CHOLECYSTECTOMY     COLON SURGERY     preventative - with appendix malignancy   HERNIA REPAIR     Patient Active Problem List   Diagnosis Date Noted   Generalized anxiety disorder with panic attacks 07/27/2023   Caregiver with fatigue 01/14/2017   Hyperlipidemia 04/02/2015   Morbid obesity (HCC) 08/06/2009   ESSENTIAL HYPERTENSION, BENIGN 08/06/2009   Sleep apnea 08/06/2009   Nonspecific abnormal results of cardiovascular function study 08/06/2009   REFERRING PROVIDER: Dickey Sprague PA-C  REFERRING DIAG: Right knee pain and arthritis.    THERAPY DIAG:  Chronic pain of right knee  Stiffness of right knee, not elsewhere classified  Localized edema  Rationale for Evaluation and Treatment: Rehabilitation  ONSET DATE: ~one year.   SUBJECTIVE:   SUBJECTIVE STATEMENT: Pt denies any pain today.   PERTINENT HISTORY: See above.   PAIN:  Are you having pain? No  PRECAUTIONS: Other: No pain increasing therex (O and CKC).    RED FLAGS: None   WEIGHT BEARING RESTRICTIONS: No  FALLS:  Has patient fallen in last 6 months? No  LIVING ENVIRONMENT: Lives in:  House/apartment Has following equipment at home: Single point cane  OCCUPATION: Retired.    PLOF: Independent and Independent with household mobility with device  PATIENT GOALS: Reduce pain.    OBJECTIVE:   PATIENT SURVEYS:  LEFS:  39/80.    EDEMA:  Circumferential: 3 cms greater on right than left.     POSTURE: Right lateral patellar tilt.    PALPATION: Patient's CC is tenderness in the region of her right knee lateral joint line.  LOWER EXTREMITY ROM: Full right knee extension and flexion to 100 degrees.  Notable crepitus.    LOWER EXTREMITY MMT: The patient exhibits a solid 4+/5 right hip and knee extension strength grade via manual muscle testing.    LOWER EXTREMITY SPECIAL TESTS:  Right knee exhibits good stability.  Significant decrease in right patellar mobility.    GAIT: Gait antalgia noted.  Patient is walking safely with a straight cane.  TREATMENT DATE:   06/05/24                                  EXERCISE LOG  Exercise Repetitions and Resistance Comments  Nustep  Lvl 2 x 15 mins   Seated Heel/Toe Raises 20 reps each   Seated Heel Slides    LAQs 2# x 20 reps bil    Seated Marches 2# x 20 reps bil   Seated Hip Abduction Red x 3 mins   Seated Hip Adduction 3 mins   Seated Ham Curls Red x 20 reps bil   STSs     Blank cell = exercise not performed today   06/02/24 Nustep L2 x 10 min UEs/LEs Supine ankle pumps x20 Manual therapy: patellar mobilization, quad STM & TPR Supine SAQ x20 Supine heel slide x20 Supine hip abd x20 Modalities: LE elevated, vasopneumatic on low and IFC at 80-150 Hz on 40% scan x 20 minutes to patient's right knee.  Normal modality resposne following removal of modality.    05/31/24:    LE elevation and vasopneumatic on low and IFC at 80-150 Hz on 40% scan x 20 minutes to patient's right knee.  Normal  modality resposne following removal of modality.   PATIENT EDUCATION:  Education details: Discussed therapeutic interventions to help decrease pain and therex that avoid pain increases.   Person educated: Patient Education method: Explanation Education comprehension: verbalized understanding  HOME EXERCISE PROGRAM:   ASSESSMENT:  CLINICAL IMPRESSION: Pt arrives for today's treatment session denying any pain.  Pt states that she has not had any pain since last treatment session. Pt introduced to seated BLE exercises today to increase strength and function.  Pt requiring min cues for proper technique and posture.  Pt with crepitus noted during LAQs and seated ham curls.  Pt denied any pain at completion of today's treatment session.  ACTIVITY LIMITATIONS: carrying, lifting, bending, standing, stairs, and locomotion level  PARTICIPATION LIMITATIONS: meal prep, cleaning, laundry, community activity, and yard work  PERSONAL FACTORS: Time since onset of injury/illness/exacerbation are also affecting patient's functional outcome.   REHAB POTENTIAL: Good  CLINICAL DECISION MAKING: Evolving/moderate complexity  EVALUATION COMPLEXITY: Low   GOALS:  SHORT TERM GOALS: Target date: 06/14/24  Ind with an initial HEP. Goal status: INITIAL   LONG TERM GOALS: Target date: 07/12/24.  Ind with an advanced HEP.  Goal status: INITIAL  2.  Perform ADL's with right knee pain not > 3/10.  Goal status: INITIAL  3.  Perform a reciprocating stair gait with on railing with pain not > 3/10.  Goal status: INITIAL  4.  Improve LEFS score by at least 10 points.  Goal status: INITIAL  PLAN:  PT FREQUENCY: 2x/week  PT DURATION: 6 weeks  PLANNED INTERVENTIONS: 97110-Therapeutic exercises, 97530- Therapeutic activity, [id number]- Neuromuscular re-education, 97535- Self Care, 02859- Manual therapy, G0283- Electrical stimulation (unattended), 97016- Vasopneumatic device, 97035- Ultrasound,  Patient/Family education, Cryotherapy, and Moist heat  PLAN FOR NEXT SESSION: Patellar mobility, Nustep, no pain increase therex (O and CKC).     [name], PTA 06/05/2024, 11:57 AM

## 2024-06-07 ENCOUNTER — Ambulatory Visit: Attending: Physician Assistant

## 2024-06-07 DIAGNOSIS — R600 Localized edema: Secondary | ICD-10-CM | POA: Insufficient documentation

## 2024-06-07 DIAGNOSIS — M25561 Pain in right knee: Secondary | ICD-10-CM | POA: Diagnosis present

## 2024-06-07 DIAGNOSIS — M25661 Stiffness of right knee, not elsewhere classified: Secondary | ICD-10-CM | POA: Insufficient documentation

## 2024-06-07 DIAGNOSIS — G8929 Other chronic pain: Secondary | ICD-10-CM | POA: Diagnosis present

## 2024-06-07 NOTE — Therapy (Signed)
 OUTPATIENT PHYSICAL THERAPY LOWER EXTREMITY TREATMENT   Patient Name: Rhonda Daniel MRN: [id number] DOB:04-30-1951, 73 y.o., female Today's Date: 06/07/2024  END OF SESSION:  PT End of Session - 06/07/24 1101     Visit Number 4    Number of Visits 12    Date for Recertification  07/12/24    Authorization Type [employer]    PT Start Time 1100    PT Stop Time 1143    PT Time Calculation (min) 43 min    Activity Tolerance Patient tolerated treatment well    Behavior During Therapy WFL for tasks assessed/performed           Past Medical History:  Diagnosis Date   Anxiety    Hyperlipidemia    Hypertension    Sleep apnea    Was on CPAP.   Does not use now.    Past Surgical History:  Procedure Laterality Date   ABDOMINAL HYSTERECTOMY     APPENDECTOMY     with malignancy of appendix   CHOLECYSTECTOMY     COLON SURGERY     preventative - with appendix malignancy   HERNIA REPAIR     Patient Active Problem List   Diagnosis Date Noted   Generalized anxiety disorder with panic attacks 07/27/2023   Caregiver with fatigue 01/14/2017   Hyperlipidemia 04/02/2015   Morbid obesity (HCC) 08/06/2009   ESSENTIAL HYPERTENSION, BENIGN 08/06/2009   Sleep apnea 08/06/2009   Nonspecific abnormal results of cardiovascular function study 08/06/2009   REFERRING PROVIDER: Dickey Sprague PA-C  REFERRING DIAG: Right knee pain and arthritis.    THERAPY DIAG:  Chronic pain of right knee  Stiffness of right knee, not elsewhere classified  Localized edema  Rationale for Evaluation and Treatment: Rehabilitation  ONSET DATE: ~one year.   SUBJECTIVE:   SUBJECTIVE STATEMENT: Pt denies any pain today.  Pt reports a little soreness after last treatment session.  PERTINENT HISTORY: See above.   PAIN:  Are you having pain? No  PRECAUTIONS: Other: No pain increasing therex (O and CKC).    RED FLAGS: None   WEIGHT BEARING RESTRICTIONS: No  FALLS:  Has patient fallen in  last 6 months? No  LIVING ENVIRONMENT: Lives in: House/apartment Has following equipment at home: Single point cane  OCCUPATION: Retired.    PLOF: Independent and Independent with household mobility with device  PATIENT GOALS: Reduce pain.    OBJECTIVE:   PATIENT SURVEYS:  LEFS:  39/80.    EDEMA:  Circumferential: 3 cms greater on right than left.     POSTURE: Right lateral patellar tilt.    PALPATION: Patient's CC is tenderness in the region of her right knee lateral joint line.  LOWER EXTREMITY ROM: Full right knee extension and flexion to 100 degrees.  Notable crepitus.    LOWER EXTREMITY MMT: The patient exhibits a solid 4+/5 right hip and knee extension strength grade via manual muscle testing.    LOWER EXTREMITY SPECIAL TESTS:  Right knee exhibits good stability.  Significant decrease in right patellar mobility.    GAIT: Gait antalgia noted.  Patient is walking safely with a straight cane.  TREATMENT DATE:   06/07/24                                  EXERCISE LOG  Exercise Repetitions and Resistance Comments  Nustep  Lvl 3 x 15 mins   Rockerboard 2.5 mins   Lunges 8 box x 3 mins   Standing Marches Airex x 3 mins   Seated Heel/Toe Raises 20 reps each   Seated Heel Slides    LAQs 2# x 25 reps bil    Seated Marches 2# x 25 reps bil   Seated Hip Abduction Red x 3 mins   Seated Hip Adduction 3 mins   Seated Ham Curls Red x 25 reps bil   STSs     Blank cell = exercise not performed today   06/02/24 Nustep L2 x 10 min UEs/LEs Supine ankle pumps x20 Manual therapy: patellar mobilization, quad STM & TPR Supine SAQ x20 Supine heel slide x20 Supine hip abd x20 Modalities: LE elevated, vasopneumatic on low and IFC at 80-150 Hz on 40% scan x 20 minutes to patient's right knee.  Normal modality resposne following removal of modality.     05/31/24:    LE elevation and vasopneumatic on low and IFC at 80-150 Hz on 40% scan x 20 minutes to patient's right knee.  Normal modality resposne following removal of modality.   PATIENT EDUCATION:  Education details: Discussed therapeutic interventions to help decrease pain and therex that avoid pain increases.   Person educated: Patient Education method: Explanation Education comprehension: verbalized understanding  HOME EXERCISE PROGRAM:   ASSESSMENT:  CLINICAL IMPRESSION: Pt arrives for today's treatment session denying any pain.  Pt states that she had a slight increase in soreness after last treatment session, but nothing too bad.  Pt introduced to standing exercises today with good results.  Pt requiring min cues for proper technique.  Pt requiring BUE support for all standing exercises.  Pt able to tolerate increased reps with seated exercises today without pain.  Pt with continued crepitus during LAQs and seated ham curls.  Pt denied any pain at completion of today's treatment session.   ACTIVITY LIMITATIONS: carrying, lifting, bending, standing, stairs, and locomotion level  PARTICIPATION LIMITATIONS: meal prep, cleaning, laundry, community activity, and yard work  PERSONAL FACTORS: Time since onset of injury/illness/exacerbation are also affecting patient's functional outcome.   REHAB POTENTIAL: Good  CLINICAL DECISION MAKING: Evolving/moderate complexity  EVALUATION COMPLEXITY: Low   GOALS:  SHORT TERM GOALS: Target date: 06/14/24  Ind with an initial HEP. Goal status: INITIAL   LONG TERM GOALS: Target date: 07/12/24.  Ind with an advanced HEP.  Goal status: INITIAL  2.  Perform ADL's with right knee pain not > 3/10.  Goal status: INITIAL  3.  Perform a reciprocating stair gait with on railing with pain not > 3/10.  Goal status: INITIAL  4.  Improve LEFS score by at least 10 points.  Goal status: INITIAL  PLAN:  PT FREQUENCY: 2x/week  PT  DURATION: 6 weeks  PLANNED INTERVENTIONS: 97110-Therapeutic exercises, 97530- Therapeutic activity, [id number]- Neuromuscular re-education, 97535- Self Care, 02859- Manual therapy, G0283- Electrical stimulation (unattended), 97016- Vasopneumatic device, 97035- Ultrasound, Patient/Family education, Cryotherapy, and Moist heat  PLAN FOR NEXT SESSION: Patellar mobility, Nustep, no pain increase therex (O and CKC).     [name], PTA 06/07/2024, 11:48 AM

## 2024-06-12 ENCOUNTER — Ambulatory Visit

## 2024-06-12 DIAGNOSIS — M25561 Pain in right knee: Secondary | ICD-10-CM | POA: Diagnosis not present

## 2024-06-12 DIAGNOSIS — R600 Localized edema: Secondary | ICD-10-CM

## 2024-06-12 DIAGNOSIS — M25661 Stiffness of right knee, not elsewhere classified: Secondary | ICD-10-CM

## 2024-06-12 DIAGNOSIS — G8929 Other chronic pain: Secondary | ICD-10-CM

## 2024-06-12 NOTE — Therapy (Signed)
 OUTPATIENT PHYSICAL THERAPY LOWER EXTREMITY TREATMENT   Patient Name: MARRAH VANEVERY MRN: [id number] DOB:[birth year], 73 y.o., female Today's Date: 06/12/2024  END OF SESSION:  PT End of Session - 06/12/24 1109     Visit Number 5    Number of Visits 12    Date for Recertification  07/12/24    Authorization Type [employer]    PT Start Time 1100    PT Stop Time 1143    PT Time Calculation (min) 43 min    Activity Tolerance Patient tolerated treatment well    Behavior During Therapy WFL for tasks assessed/performed           Past Medical History:  Diagnosis Date   Anxiety    Hyperlipidemia    Hypertension    Sleep apnea    Was on CPAP.   Does not use now.    Past Surgical History:  Procedure Laterality Date   ABDOMINAL HYSTERECTOMY     APPENDECTOMY     with malignancy of appendix   CHOLECYSTECTOMY     COLON SURGERY     preventative - with appendix malignancy   HERNIA REPAIR     Patient Active Problem List   Diagnosis Date Noted   Generalized anxiety disorder with panic attacks 07/27/2023   Caregiver with fatigue 01/14/2017   Hyperlipidemia 04/02/2015   Morbid obesity (HCC) 08/06/2009   ESSENTIAL HYPERTENSION, BENIGN 08/06/2009   Sleep apnea 08/06/2009   Nonspecific abnormal results of cardiovascular function study 08/06/2009   REFERRING PROVIDER: Dickey Sprague PA-C  REFERRING DIAG: Right knee pain and arthritis.    THERAPY DIAG:  Chronic pain of right knee  Stiffness of right knee, not elsewhere classified  Localized edema  Rationale for Evaluation and Treatment: Rehabilitation  ONSET DATE: ~one year.   SUBJECTIVE:   SUBJECTIVE STATEMENT: Pt denies any pain today.    PERTINENT HISTORY: See above.   PAIN:  Are you having pain? No  PRECAUTIONS: Other: No pain increasing therex (O and CKC).    RED FLAGS: None   WEIGHT BEARING RESTRICTIONS: No  FALLS:  Has patient fallen in last 6 months? No  LIVING ENVIRONMENT: Lives in:  House/apartment Has following equipment at home: Single point cane  OCCUPATION: Retired.    PLOF: Independent and Independent with household mobility with device  PATIENT GOALS: Reduce pain.    OBJECTIVE:   PATIENT SURVEYS:  LEFS:  39/80.    EDEMA:  Circumferential: 3 cms greater on right than left.     POSTURE: Right lateral patellar tilt.    PALPATION: Patient's CC is tenderness in the region of her right knee lateral joint line.  LOWER EXTREMITY ROM: Full right knee extension and flexion to 100 degrees.  Notable crepitus.    LOWER EXTREMITY MMT: The patient exhibits a solid 4+/5 right hip and knee extension strength grade via manual muscle testing.    LOWER EXTREMITY SPECIAL TESTS:  Right knee exhibits good stability.  Significant decrease in right patellar mobility.    GAIT: Gait antalgia noted.  Patient is walking safely with a straight cane.  TREATMENT DATE:   06/12/24                                  EXERCISE LOG  Exercise Repetitions and Resistance Comments  Nustep  Lvl 3 x 17 mins   Rockerboard 3 mins   Lunges 8 box x 3 mins   Standing Marches Airex x 3 mins   Seated Heel/Toe Raises 20 reps each   Seated Heel Slides    LAQs 3# x 20 reps bil    Seated Marches 3# x 20 reps bil   Seated Hip Abduction Green x 3.5 mins   Seated Hip Adduction 3.5 mins   Seated Ham Curls Green x 25 reps bil   STSs 10 reps    Blank cell = exercise not performed today   06/02/24 Nustep L2 x 10 min UEs/LEs Supine ankle pumps x20 Manual therapy: patellar mobilization, quad STM & TPR Supine SAQ x20 Supine heel slide x20 Supine hip abd x20 Modalities: LE elevated, vasopneumatic on low and IFC at 80-150 Hz on 40% scan x 20 minutes to patient's right knee.  Normal modality resposne following removal of modality.    05/31/24:    LE elevation and  vasopneumatic on low and IFC at 80-150 Hz on 40% scan x 20 minutes to patient's right knee.  Normal modality resposne following removal of modality.   PATIENT EDUCATION:  Education details: Discussed therapeutic interventions to help decrease pain and therex that avoid pain increases.   Person educated: Patient Education method: Explanation Education comprehension: verbalized understanding  HOME EXERCISE PROGRAM:   ASSESSMENT:  CLINICAL IMPRESSION: Pt arrives for today's treatment session denying any pain.  Pt able to tolerate increased resistance or time/reps with all previously performed exercises.  Pt able to perform 10 STS transfers without rest break.  Pt does required BUE support for all STS transfers.  Continued crepitus noted.  Pt has appt with ortho next Friday.  Pt denied any pain at completion of today's treatment session.  ACTIVITY LIMITATIONS: carrying, lifting, bending, standing, stairs, and locomotion level  PARTICIPATION LIMITATIONS: meal prep, cleaning, laundry, community activity, and yard work  PERSONAL FACTORS: Time since onset of injury/illness/exacerbation are also affecting patient's functional outcome.   REHAB POTENTIAL: Good  CLINICAL DECISION MAKING: Evolving/moderate complexity  EVALUATION COMPLEXITY: Low   GOALS:  SHORT TERM GOALS: Target date: 06/14/24  Ind with an initial HEP. Goal status: MET   LONG TERM GOALS: Target date: 07/12/24.  Ind with an advanced HEP.  Goal status: IN PROGRESS  2.  Perform ADL's with right knee pain not > 3/10.  Goal status: IN PROGRESS  3.  Perform a reciprocating stair gait with on railing with pain not > 3/10.  Goal status: IN PROGRESS  4.  Improve LEFS score by at least 10 points.  Goal status: IN PROGRESS  PLAN:  PT FREQUENCY: 2x/week  PT DURATION: 6 weeks  PLANNED INTERVENTIONS: 97110-Therapeutic exercises, 97530- Therapeutic activity, [id number]- Neuromuscular re-education, 97535- Self Care, [id number]-  Manual therapy, G0283- Electrical stimulation (unattended), [id number]- Vasopneumatic device, 97035- Ultrasound, Patient/Family education, Cryotherapy, and Moist heat  PLAN FOR NEXT SESSION: Patellar mobility, Nustep, no pain increase therex (O and CKC).     [name], PTA 06/12/2024, 11:57 AM

## 2024-06-16 ENCOUNTER — Ambulatory Visit

## 2024-06-16 DIAGNOSIS — G8929 Other chronic pain: Secondary | ICD-10-CM

## 2024-06-16 DIAGNOSIS — R600 Localized edema: Secondary | ICD-10-CM

## 2024-06-16 DIAGNOSIS — M25561 Pain in right knee: Secondary | ICD-10-CM | POA: Diagnosis not present

## 2024-06-16 DIAGNOSIS — M25661 Stiffness of right knee, not elsewhere classified: Secondary | ICD-10-CM

## 2024-06-16 NOTE — Therapy (Signed)
 OUTPATIENT PHYSICAL THERAPY LOWER EXTREMITY TREATMENT   Patient Name: Rhonda Daniel MRN: [id number] DOB:Jul 19, 1951, 73 y.o., female Today's Date: 06/16/2024  END OF SESSION:  PT End of Session - 06/16/24 1111     Visit Number 6    Number of Visits 12    Date for Recertification  07/12/24    Authorization Type [employer]    PT Start Time 1101    PT Stop Time 1146    PT Time Calculation (min) 45 min    Activity Tolerance Patient tolerated treatment well    Behavior During Therapy WFL for tasks assessed/performed           Past Medical History:  Diagnosis Date   Anxiety    Hyperlipidemia    Hypertension    Sleep apnea    Was on CPAP.   Does not use now.    Past Surgical History:  Procedure Laterality Date   ABDOMINAL HYSTERECTOMY     APPENDECTOMY     with malignancy of appendix   CHOLECYSTECTOMY     COLON SURGERY     preventative - with appendix malignancy   HERNIA REPAIR     Patient Active Problem List   Diagnosis Date Noted   Generalized anxiety disorder with panic attacks 07/27/2023   Caregiver with fatigue 01/14/2017   Hyperlipidemia 04/02/2015   Morbid obesity (HCC) 08/06/2009   ESSENTIAL HYPERTENSION, BENIGN 08/06/2009   Sleep apnea 08/06/2009   Nonspecific abnormal results of cardiovascular function study 08/06/2009   REFERRING PROVIDER: Dickey Sprague PA-C  REFERRING DIAG: Right knee pain and arthritis.    THERAPY DIAG:  Chronic pain of right knee  Stiffness of right knee, not elsewhere classified  Localized edema  Rationale for Evaluation and Treatment: Rehabilitation  ONSET DATE: ~one year.   SUBJECTIVE:   SUBJECTIVE STATEMENT: Pt denies any pain today.    PERTINENT HISTORY: See above.   PAIN:  Are you having pain? No  PRECAUTIONS: Other: No pain increasing therex (O and CKC).    RED FLAGS: None   WEIGHT BEARING RESTRICTIONS: No  FALLS:  Has patient fallen in last 6 months? No  LIVING ENVIRONMENT: Lives in:  House/apartment Has following equipment at home: Single point cane  OCCUPATION: Retired.    PLOF: Independent and Independent with household mobility with device  PATIENT GOALS: Reduce pain.    OBJECTIVE:   PATIENT SURVEYS:  LEFS:  39/80.    EDEMA:  Circumferential: 3 cms greater on right than left.     POSTURE: Right lateral patellar tilt.    PALPATION: Patient's CC is tenderness in the region of her right knee lateral joint line.  LOWER EXTREMITY ROM: Full right knee extension and flexion to 100 degrees.  Notable crepitus.    LOWER EXTREMITY MMT: The patient exhibits a solid 4+/5 right hip and knee extension strength grade via manual muscle testing.    LOWER EXTREMITY SPECIAL TESTS:  Right knee exhibits good stability.  Significant decrease in right patellar mobility.    GAIT: Gait antalgia noted.  Patient is walking safely with a straight cane.  TREATMENT DATE:   06/16/24                                  EXERCISE LOG  Exercise Repetitions and Resistance Comments  Nustep  Lvl 3 x 17 mins   Rockerboard 3 mins   Lunges    Standing Marches    Side-stepping Airex x 3.5 mins   Tandem  Airex x 3.5 mins   Standing Heel/Toe Raises 25 reps each   Seated Heel Slides    LAQs 3# x 25 reps bil    Seated Marches 3# x 25 reps bil   Seated Hip Abduction Green x 3.5 mins   Seated Hip Adduction    Seated Ham Curls Green x 25 reps bil   STSs 10 reps    Blank cell = exercise not performed today   06/02/24 Nustep L2 x 10 min UEs/LEs Supine ankle pumps x20 Manual therapy: patellar mobilization, quad STM & TPR Supine SAQ x20 Supine heel slide x20 Supine hip abd x20 Modalities: LE elevated, vasopneumatic on low and IFC at 80-150 Hz on 40% scan x 20 minutes to patient's right knee.  Normal modality resposne following removal of modality.    05/31/24:     LE elevation and vasopneumatic on low and IFC at 80-150 Hz on 40% scan x 20 minutes to patient's right knee.  Normal modality resposne following removal of modality.   PATIENT EDUCATION:  Education details: Discussed therapeutic interventions to help decrease pain and therex that avoid pain increases.   Person educated: Patient Education method: Explanation Education comprehension: verbalized understanding  HOME EXERCISE PROGRAM:   ASSESSMENT:  CLINICAL IMPRESSION: Pt arrives for today's treatment session denying any pain, but does reports having a slight head cold today.  Pt introduced to standing Airex balance beam exercises today with good results.  Pt requiring BUE support for safety and stability with all balance exercises.  Pt able to tolerate increased reps or time with all previously performed seated exercises.  Pt denied any pain at completion of today's treatment session.   ACTIVITY LIMITATIONS: carrying, lifting, bending, standing, stairs, and locomotion level  PARTICIPATION LIMITATIONS: meal prep, cleaning, laundry, community activity, and yard work  PERSONAL FACTORS: Time since onset of injury/illness/exacerbation are also affecting patient's functional outcome.   REHAB POTENTIAL: Good  CLINICAL DECISION MAKING: Evolving/moderate complexity  EVALUATION COMPLEXITY: Low   GOALS:  SHORT TERM GOALS: Target date: 06/14/24  Ind with an initial HEP. Goal status: MET   LONG TERM GOALS: Target date: 07/12/24.  Ind with an advanced HEP.  Goal status: IN PROGRESS  2.  Perform ADL's with right knee pain not > 3/10.  Goal status: IN PROGRESS  3.  Perform a reciprocating stair gait with on railing with pain not > 3/10.  Goal status: IN PROGRESS  4.  Improve LEFS score by at least 10 points.  Goal status: IN PROGRESS  PLAN:  PT FREQUENCY: 2x/week  PT DURATION: 6 weeks  PLANNED INTERVENTIONS: 97110-Therapeutic exercises, 97530- Therapeutic activity, [id number]-  Neuromuscular re-education, 97535- Self Care, 02859- Manual therapy, G0283- Electrical stimulation (unattended), 97016- Vasopneumatic device, 97035- Ultrasound, Patient/Family education, Cryotherapy, and Moist heat  PLAN FOR NEXT SESSION: Patellar mobility, Nustep, no pain increase therex (O and CKC).     [name], PTA 06/16/2024, 11:48 AM

## 2024-06-19 ENCOUNTER — Ambulatory Visit

## 2024-06-19 DIAGNOSIS — M25561 Pain in right knee: Secondary | ICD-10-CM | POA: Diagnosis not present

## 2024-06-19 DIAGNOSIS — M25661 Stiffness of right knee, not elsewhere classified: Secondary | ICD-10-CM

## 2024-06-19 DIAGNOSIS — R600 Localized edema: Secondary | ICD-10-CM

## 2024-06-19 DIAGNOSIS — G8929 Other chronic pain: Secondary | ICD-10-CM

## 2024-06-19 NOTE — Therapy (Signed)
 OUTPATIENT PHYSICAL THERAPY LOWER EXTREMITY TREATMENT   Patient Name: Rhonda Daniel MRN: [id number] DOB:01-03-1951, 73 y.o., female Today's Date: 06/19/2024  END OF SESSION:  PT End of Session - 06/19/24 1111     Visit Number 7    Number of Visits 12    Date for Recertification  07/12/24    Authorization Type [employer]    PT Start Time 1108    PT Stop Time 1151    PT Time Calculation (min) 43 min    Activity Tolerance Patient tolerated treatment well    Behavior During Therapy WFL for tasks assessed/performed           Past Medical History:  Diagnosis Date   Anxiety    Hyperlipidemia    Hypertension    Sleep apnea    Was on CPAP.   Does not use now.    Past Surgical History:  Procedure Laterality Date   ABDOMINAL HYSTERECTOMY     APPENDECTOMY     with malignancy of appendix   CHOLECYSTECTOMY     COLON SURGERY     preventative - with appendix malignancy   HERNIA REPAIR     Patient Active Problem List   Diagnosis Date Noted   Generalized anxiety disorder with panic attacks 07/27/2023   Caregiver with fatigue 01/14/2017   Hyperlipidemia 04/02/2015   Morbid obesity (HCC) 08/06/2009   ESSENTIAL HYPERTENSION, BENIGN 08/06/2009   Sleep apnea 08/06/2009   Nonspecific abnormal results of cardiovascular function study 08/06/2009   REFERRING PROVIDER: Dickey Sprague PA-C  REFERRING DIAG: Right knee pain and arthritis.    THERAPY DIAG:  Chronic pain of right knee  Stiffness of right knee, not elsewhere classified  Localized edema  Rationale for Evaluation and Treatment: Rehabilitation  ONSET DATE: ~one year.   SUBJECTIVE:   SUBJECTIVE STATEMENT: Pt denies any pain today.    PERTINENT HISTORY: See above.   PAIN:  Are you having pain? No  PRECAUTIONS: Other: No pain increasing therex (O and CKC).    RED FLAGS: None   WEIGHT BEARING RESTRICTIONS: No  FALLS:  Has patient fallen in last 6 months? No  LIVING ENVIRONMENT: Lives in:  House/apartment Has following equipment at home: Single point cane  OCCUPATION: Retired.    PLOF: Independent and Independent with household mobility with device  PATIENT GOALS: Reduce pain.    OBJECTIVE:   PATIENT SURVEYS:  LEFS:  39/80.    EDEMA:  Circumferential: 3 cms greater on right than left.     POSTURE: Right lateral patellar tilt.    PALPATION: Patient's CC is tenderness in the region of her right knee lateral joint line.  LOWER EXTREMITY ROM: Full right knee extension and flexion to 100 degrees.  Notable crepitus.    LOWER EXTREMITY MMT: The patient exhibits a solid 4+/5 right hip and knee extension strength grade via manual muscle testing.    LOWER EXTREMITY SPECIAL TESTS:  Right knee exhibits good stability.  Significant decrease in right patellar mobility.    GAIT: Gait antalgia noted.  Patient is walking safely with a straight cane.  TREATMENT DATE:   06/19/24                                  EXERCISE LOG  Exercise Repetitions and Resistance Comments  Nustep  Lvl 3 x 17 mins   Rockerboard 4 mins   Lunges    Standing Marches    Side-stepping Airex x 4 mins   Tandem  Airex x 4 mins   Standing Heel/Toe Raises 25 reps each   Seated Heel Slides    LAQs 3# x 2 sets of 15 reps   Seated Marches 3# x 2 sets of 15 reps   Seated Hip Abduction Green x 4 mins   Seated Hip Adduction    Seated Ham Curls Green x 30 reps bil   STSs 10 reps    Blank cell = exercise not performed today   06/02/24 Nustep L2 x 10 min UEs/LEs Supine ankle pumps x20 Manual therapy: patellar mobilization, quad STM & TPR Supine SAQ x20 Supine heel slide x20 Supine hip abd x20 Modalities: LE elevated, vasopneumatic on low and IFC at 80-150 Hz on 40% scan x 20 minutes to patient's right knee.  Normal modality resposne following removal of modality.     05/31/24:    LE elevation and vasopneumatic on low and IFC at 80-150 Hz on 40% scan x 20 minutes to patient's right knee.  Normal modality resposne following removal of modality.   PATIENT EDUCATION:  Education details: Discussed therapeutic interventions to help decrease pain and therex that avoid pain increases.   Person educated: Patient Education method: Explanation Education comprehension: verbalized understanding  HOME EXERCISE PROGRAM:   ASSESSMENT:  CLINICAL IMPRESSION: Pt arrives for today's treatment session denying any pain.  Pt able to tolerate additional time with all standing balance exercises today with minimal fatigue noted.  Pt also able to tolerate increased reps with all seated exercises today as well.  Pt has appt with ortho on Friday.  Pt denied any pain at completion of today's treatment session.  ACTIVITY LIMITATIONS: carrying, lifting, bending, standing, stairs, and locomotion level  PARTICIPATION LIMITATIONS: meal prep, cleaning, laundry, community activity, and yard work  PERSONAL FACTORS: Time since onset of injury/illness/exacerbation are also affecting patient's functional outcome.   REHAB POTENTIAL: Good  CLINICAL DECISION MAKING: Evolving/moderate complexity  EVALUATION COMPLEXITY: Low   GOALS:  SHORT TERM GOALS: Target date: 06/14/24  Ind with an initial HEP. Goal status: MET   LONG TERM GOALS: Target date: 07/12/24.  Ind with an advanced HEP.  Goal status: IN PROGRESS  2.  Perform ADL's with right knee pain not > 3/10.  Goal status: IN PROGRESS  3.  Perform a reciprocating stair gait with on railing with pain not > 3/10.  Goal status: IN PROGRESS  4.  Improve LEFS score by at least 10 points.  Goal status: IN PROGRESS  PLAN:  PT FREQUENCY: 2x/week  PT DURATION: 6 weeks  PLANNED INTERVENTIONS: 97110-Therapeutic exercises, 97530- Therapeutic activity, [id number]- Neuromuscular re-education, 97535- Self Care, 02859- Manual  therapy, G0283- Electrical stimulation (unattended), 97016- Vasopneumatic device, 97035- Ultrasound, Patient/Family education, Cryotherapy, and Moist heat  PLAN FOR NEXT SESSION: Patellar mobility, Nustep, no pain increase therex (O and CKC).     [name], PTA 06/19/2024, 11:51 AM

## 2024-06-20 ENCOUNTER — Ambulatory Visit

## 2024-06-20 DIAGNOSIS — M25561 Pain in right knee: Secondary | ICD-10-CM | POA: Diagnosis not present

## 2024-06-20 DIAGNOSIS — M25661 Stiffness of right knee, not elsewhere classified: Secondary | ICD-10-CM

## 2024-06-20 DIAGNOSIS — G8929 Other chronic pain: Secondary | ICD-10-CM

## 2024-06-20 DIAGNOSIS — R600 Localized edema: Secondary | ICD-10-CM

## 2024-06-20 NOTE — Therapy (Signed)
 OUTPATIENT PHYSICAL THERAPY LOWER EXTREMITY TREATMENT   Patient Name: Rhonda Daniel MRN: [id number] DOB:[birth year], 73 y.o., female Today's Date: 06/20/2024  END OF SESSION:  PT End of Session - 06/20/24 0850     Visit Number 8    Number of Visits 12    Date for Recertification  07/12/24    Authorization Type [employer]    PT Start Time 0848    PT Stop Time 0935    PT Time Calculation (min) 47 min    Activity Tolerance Patient tolerated treatment well    Behavior During Therapy [facility] for tasks assessed/performed           Past Medical History:  Diagnosis Date   Anxiety    Hyperlipidemia    Hypertension    Sleep apnea    Was on CPAP.   Does not use now.    Past Surgical History:  Procedure Laterality Date   ABDOMINAL HYSTERECTOMY     APPENDECTOMY     with malignancy of appendix   CHOLECYSTECTOMY     COLON SURGERY     preventative - with appendix malignancy   HERNIA REPAIR     Patient Active Problem List   Diagnosis Date Noted   Generalized anxiety disorder with panic attacks 07/27/2023   Caregiver with fatigue 01/14/2017   Hyperlipidemia 04/02/2015   Morbid obesity (HCC) 08/06/2009   ESSENTIAL HYPERTENSION, BENIGN 08/06/2009   Sleep apnea 08/06/2009   Nonspecific abnormal results of cardiovascular function study 08/06/2009   REFERRING PROVIDER: Dickey Sprague PA-C  REFERRING DIAG: Right knee pain and arthritis.    THERAPY DIAG:  Chronic pain of right knee  Stiffness of right knee, not elsewhere classified  Localized edema  Rationale for Evaluation and Treatment: Rehabilitation  ONSET DATE: ~one year.   SUBJECTIVE:   SUBJECTIVE STATEMENT: Pt denies any pain today.    PERTINENT HISTORY: See above.   PAIN:  Are you having pain? No  PRECAUTIONS: Other: No pain increasing therex (O and CKC).    RED FLAGS: None   WEIGHT BEARING RESTRICTIONS: No  FALLS:  Has patient fallen in last 6 months? No  LIVING ENVIRONMENT: Lives in:  House/apartment Has following equipment at home: Single point cane  OCCUPATION: Retired.    PLOF: Independent and Independent with household mobility with device  PATIENT GOALS: Reduce pain.    OBJECTIVE:   PATIENT SURVEYS:  LEFS:  39/80.    EDEMA:  Circumferential: 3 cms greater on right than left.     POSTURE: Right lateral patellar tilt.    PALPATION: Patient's CC is tenderness in the region of her right knee lateral joint line.  LOWER EXTREMITY ROM: Full right knee extension and flexion to 100 degrees.  Notable crepitus.    LOWER EXTREMITY MMT: The patient exhibits a solid 4+/5 right hip and knee extension strength grade via manual muscle testing.    LOWER EXTREMITY SPECIAL TESTS:  Right knee exhibits good stability.  Significant decrease in right patellar mobility.    GAIT: Gait antalgia noted.  Patient is walking safely with a straight cane.  TREATMENT DATE:   06/20/24                                  EXERCISE LOG  Exercise Repetitions and Resistance Comments  Nustep  Lvl 3 x 17 mins   Rockerboard 4.5 mins   Lunges    Standing Marches    Side-stepping Airex x 4.5 mins   Tandem  Airex x 4.5 mins   Standing Heel/Toe Raises    Seated Heel Slides    LAQs 4# x 20 reps   Seated Marches 4# x 20 reps   Seated Hip Abduction Blue x 2 mins   Seated Hip Adduction    Seated Ham Curls Blue x 20 reps bil   STSs 10 reps    Blank cell = exercise not performed today   06/02/24 Nustep L2 x 10 min UEs/LEs Supine ankle pumps x20 Manual therapy: patellar mobilization, quad STM & TPR Supine SAQ x20 Supine heel slide x20 Supine hip abd x20 Modalities: LE elevated, vasopneumatic on low and IFC at 80-150 Hz on 40% scan x 20 minutes to patient's right knee.  Normal modality resposne following removal of modality.    05/31/24:    LE elevation and  vasopneumatic on low and IFC at 80-150 Hz on 40% scan x 20 minutes to patient's right knee.  Normal modality resposne following removal of modality.   PATIENT EDUCATION:  Education details: Discussed therapeutic interventions to help decrease pain and therex that avoid pain increases.   Person educated: Patient Education method: Explanation Education comprehension: verbalized understanding  HOME EXERCISE PROGRAM:   ASSESSMENT:  CLINICAL IMPRESSION: Pt arrives for today's treatment session denying any pain.  Pt has appointment with ortho on Friday.  Pt able to tolerate increased time with all balance exercises today.  Pt also able to tolerate increased reps or resistance with all seated exercises today.  Pt denied any pain at completion of today's treatment session.  ACTIVITY LIMITATIONS: carrying, lifting, bending, standing, stairs, and locomotion level  PARTICIPATION LIMITATIONS: meal prep, cleaning, laundry, community activity, and yard work  PERSONAL FACTORS: Time since onset of injury/illness/exacerbation are also affecting patient's functional outcome.   REHAB POTENTIAL: Good  CLINICAL DECISION MAKING: Evolving/moderate complexity  EVALUATION COMPLEXITY: Low   GOALS:  SHORT TERM GOALS: Target date: 06/14/24  Ind with an initial HEP. Goal status: MET   LONG TERM GOALS: Target date: 07/12/24.  Ind with an advanced HEP.  Goal status: IN PROGRESS  2.  Perform ADL's with right knee pain not > 3/10.  Goal status: IN PROGRESS  3.  Perform a reciprocating stair gait with on railing with pain not > 3/10.  Goal status: IN PROGRESS  4.  Improve LEFS score by at least 10 points.  Goal status: IN PROGRESS  PLAN:  PT FREQUENCY: 2x/week  PT DURATION: 6 weeks  PLANNED INTERVENTIONS: 97110-Therapeutic exercises, 97530- Therapeutic activity, [id number]- Neuromuscular re-education, 97535- Self Care, 02859- Manual therapy, G0283- Electrical stimulation (unattended), 97016-  Vasopneumatic device, 97035- Ultrasound, Patient/Family education, Cryotherapy, and Moist heat  PLAN FOR NEXT SESSION: Patellar mobility, Nustep, no pain increase therex (O and CKC).     [name], PTA 06/20/2024, 10:14 AM

## 2024-06-21 ENCOUNTER — Encounter

## 2024-06-26 ENCOUNTER — Ambulatory Visit

## 2024-06-26 DIAGNOSIS — M25561 Pain in right knee: Secondary | ICD-10-CM | POA: Diagnosis not present

## 2024-06-26 DIAGNOSIS — G8929 Other chronic pain: Secondary | ICD-10-CM

## 2024-06-26 DIAGNOSIS — M25661 Stiffness of right knee, not elsewhere classified: Secondary | ICD-10-CM

## 2024-06-26 DIAGNOSIS — R600 Localized edema: Secondary | ICD-10-CM

## 2024-06-26 NOTE — Therapy (Signed)
 OUTPATIENT PHYSICAL THERAPY LOWER EXTREMITY TREATMENT   Patient Name: Rhonda Daniel MRN: [id number] DOB:12-Nov-1950, 73 y.o., female Today's Date: 06/26/2024  END OF SESSION:  PT End of Session - 06/26/24 1102     Visit Number 9    Number of Visits 12    Date for Recertification  07/12/24    Authorization Type [employer]    PT Start Time 1100    PT Stop Time 1151    PT Time Calculation (min) 51 min    Activity Tolerance Patient tolerated treatment well    Behavior During Therapy WFL for tasks assessed/performed           Past Medical History:  Diagnosis Date   Anxiety    Hyperlipidemia    Hypertension    Sleep apnea    Was on CPAP.   Does not use now.    Past Surgical History:  Procedure Laterality Date   ABDOMINAL HYSTERECTOMY     APPENDECTOMY     with malignancy of appendix   CHOLECYSTECTOMY     COLON SURGERY     preventative - with appendix malignancy   HERNIA REPAIR     Patient Active Problem List   Diagnosis Date Noted   Generalized anxiety disorder with panic attacks 07/27/2023   Caregiver with fatigue 01/14/2017   Hyperlipidemia 04/02/2015   Morbid obesity (HCC) 08/06/2009   ESSENTIAL HYPERTENSION, BENIGN 08/06/2009   Sleep apnea 08/06/2009   Nonspecific abnormal results of cardiovascular function study 08/06/2009   REFERRING PROVIDER: Dickey Sprague PA-C  REFERRING DIAG: Right knee pain and arthritis.    THERAPY DIAG:  Chronic pain of right knee  Stiffness of right knee, not elsewhere classified  Localized edema  Rationale for Evaluation and Treatment: Rehabilitation  ONSET DATE: ~one year.   SUBJECTIVE:   SUBJECTIVE STATEMENT: Pt denies any pain today.    PERTINENT HISTORY: See above.   PAIN:  Are you having pain? No  PRECAUTIONS: Other: No pain increasing therex (O and CKC).    RED FLAGS: None   WEIGHT BEARING RESTRICTIONS: No  FALLS:  Has patient fallen in last 6 months? No  LIVING ENVIRONMENT: Lives in:  House/apartment Has following equipment at home: Single point cane  OCCUPATION: Retired.    PLOF: Independent and Independent with household mobility with device  PATIENT GOALS: Reduce pain.    OBJECTIVE:   PATIENT SURVEYS:  LEFS:  39/80.    EDEMA:  Circumferential: 3 cms greater on right than left.     POSTURE: Right lateral patellar tilt.    PALPATION: Patient's CC is tenderness in the region of her right knee lateral joint line.  LOWER EXTREMITY ROM: Full right knee extension and flexion to 100 degrees.  Notable crepitus.    LOWER EXTREMITY MMT: The patient exhibits a solid 4+/5 right hip and knee extension strength grade via manual muscle testing.    LOWER EXTREMITY SPECIAL TESTS:  Right knee exhibits good stability.  Significant decrease in right patellar mobility.    GAIT: Gait antalgia noted.  Patient is walking safely with a straight cane.  TREATMENT DATE:   06/26/24                                  EXERCISE LOG  Exercise Repetitions and Resistance Comments  Nustep  Lvl 3 x 17 mins   Rockerboard 5 mins   Lunges    Standing Marches    Side-stepping Airex x 6 mins   Tandem  Airex x 5 mins   Standing Heel/Toe Raises    Seated Heel Slides    LAQs 4# x 25 reps   Seated Marches 4# x 25 reps   Seated Hip Abduction Blue x 2.5 mins   Seated Hip Adduction    Seated Ham Curls Blue x 25 reps bil   STSs 10 reps    Blank cell = exercise not performed today   06/02/24 Nustep L2 x 10 min UEs/LEs Supine ankle pumps x20 Manual therapy: patellar mobilization, quad STM & TPR Supine SAQ x20 Supine heel slide x20 Supine hip abd x20 Modalities: LE elevated, vasopneumatic on low and IFC at 80-150 Hz on 40% scan x 20 minutes to patient's right knee.  Normal modality resposne following removal of modality.    05/31/24:    LE elevation and  vasopneumatic on low and IFC at 80-150 Hz on 40% scan x 20 minutes to patient's right knee.  Normal modality resposne following removal of modality.   PATIENT EDUCATION:  Education details: Discussed therapeutic interventions to help decrease pain and therex that avoid pain increases.   Person educated: Patient Education method: Explanation Education comprehension: verbalized understanding  HOME EXERCISE PROGRAM:   ASSESSMENT:  CLINICAL IMPRESSION: Pt arrives for today's treatment session denying any pain.  Pt states that she saw the surgeons PA on Friday and they are pleased with her progress with therapy.  She has a follow up in 6 weeks.  Pt able to tolerate increased time or reps with all previously performed exercises.  Pt requiring minimal cues to decrease UE support during balance activities.  Pt denied any pain at completion of today's treatment session.  ACTIVITY LIMITATIONS: carrying, lifting, bending, standing, stairs, and locomotion level  PARTICIPATION LIMITATIONS: meal prep, cleaning, laundry, community activity, and yard work  PERSONAL FACTORS: Time since onset of injury/illness/exacerbation are also affecting patient's functional outcome.   REHAB POTENTIAL: Good  CLINICAL DECISION MAKING: Evolving/moderate complexity  EVALUATION COMPLEXITY: Low   GOALS:  SHORT TERM GOALS: Target date: 06/14/24  Ind with an initial HEP. Goal status: MET   LONG TERM GOALS: Target date: 07/12/24.  Ind with an advanced HEP.  Goal status: IN PROGRESS  2.  Perform ADL's with right knee pain not > 3/10.  Goal status: IN PROGRESS  3.  Perform a reciprocating stair gait with on railing with pain not > 3/10.  Goal status: IN PROGRESS  4.  Improve LEFS score by at least 10 points.  Goal status: IN PROGRESS  PLAN:  PT FREQUENCY: 2x/week  PT DURATION: 6 weeks  PLANNED INTERVENTIONS: 97110-Therapeutic exercises, 97530- Therapeutic activity, [id number]- Neuromuscular re-education,  97535- Self Care, 02859- Manual therapy, [id number]- Electrical stimulation (unattended), 97016- Vasopneumatic device, 97035- Ultrasound, Patient/Family education, Cryotherapy, and Moist heat  PLAN FOR NEXT SESSION: Patellar mobility, Nustep, no pain increase therex (O and CKC).     [name], PTA 06/26/2024, 11:54 AM

## 2024-06-28 ENCOUNTER — Ambulatory Visit

## 2024-06-28 DIAGNOSIS — M25561 Pain in right knee: Secondary | ICD-10-CM | POA: Diagnosis not present

## 2024-06-28 DIAGNOSIS — M25661 Stiffness of right knee, not elsewhere classified: Secondary | ICD-10-CM

## 2024-06-28 DIAGNOSIS — G8929 Other chronic pain: Secondary | ICD-10-CM

## 2024-06-28 DIAGNOSIS — R600 Localized edema: Secondary | ICD-10-CM

## 2024-06-28 NOTE — Therapy (Addendum)
 OUTPATIENT PHYSICAL THERAPY LOWER EXTREMITY TREATMENT   Patient Name: Rhonda Daniel MRN: [id number] DOB:[birth year], 73 y.o., female Today's Date: 06/28/2024  END OF SESSION:  PT End of Session - 06/28/24 1116     Visit Number 10    Number of Visits 12    Date for Recertification  07/12/24    Authorization Type [employer]    PT Start Time 1111    PT Stop Time 1154    PT Time Calculation (min) 43 min    Activity Tolerance Patient tolerated treatment well    Behavior During Therapy WFL for tasks assessed/performed           Past Medical History:  Diagnosis Date   Anxiety    Hyperlipidemia    Hypertension    Sleep apnea    Was on CPAP.   Does not use now.    Past Surgical History:  Procedure Laterality Date   ABDOMINAL HYSTERECTOMY     APPENDECTOMY     with malignancy of appendix   CHOLECYSTECTOMY     COLON SURGERY     preventative - with appendix malignancy   HERNIA REPAIR     Patient Active Problem List   Diagnosis Date Noted   Generalized anxiety disorder with panic attacks 07/27/2023   Caregiver with fatigue 01/14/2017   Hyperlipidemia 04/02/2015   Morbid obesity (HCC) 08/06/2009   ESSENTIAL HYPERTENSION, BENIGN 08/06/2009   Sleep apnea 08/06/2009   Nonspecific abnormal results of cardiovascular function study 08/06/2009   REFERRING PROVIDER: Dickey Sprague PA-C  REFERRING DIAG: Right knee pain and arthritis.    THERAPY DIAG:  Chronic pain of right knee  Stiffness of right knee, not elsewhere classified  Localized edema  Rationale for Evaluation and Treatment: Rehabilitation  ONSET DATE: ~one year.   SUBJECTIVE:   SUBJECTIVE STATEMENT: Pt denies any pain today.    PERTINENT HISTORY: See above.   PAIN:  Are you having pain? No  PRECAUTIONS: Other: No pain increasing therex (O and CKC).    RED FLAGS: None   WEIGHT BEARING RESTRICTIONS: No  FALLS:  Has patient fallen in last 6 months? No  LIVING ENVIRONMENT: Lives in:  House/apartment Has following equipment at home: Single point cane  OCCUPATION: Retired.    PLOF: Independent and Independent with household mobility with device  PATIENT GOALS: Reduce pain.    OBJECTIVE:   PATIENT SURVEYS:  LEFS:  39/80.    EDEMA:  Circumferential: 3 cms greater on right than left.     POSTURE: Right lateral patellar tilt.    PALPATION: Patient's CC is tenderness in the region of her right knee lateral joint line.  LOWER EXTREMITY ROM: Full right knee extension and flexion to 100 degrees.  Notable crepitus.    LOWER EXTREMITY MMT: The patient exhibits a solid 4+/5 right hip and knee extension strength grade via manual muscle testing.    LOWER EXTREMITY SPECIAL TESTS:  Right knee exhibits good stability.  Significant decrease in right patellar mobility.    GAIT: Gait antalgia noted.  Patient is walking safely with a straight cane.  TREATMENT DATE:   06/26/24                                  EXERCISE LOG  Exercise Repetitions and Resistance Comments  Nustep  Lvl 3 x 15 mins   Rockerboard 5 mins   Lunges    Standing Marches    Side-stepping Airex x 6 mins   Tandem  Airex x 5 mins   Standing Heel/Toe Raises    Seated Heel Slides    LAQs 4# x 30 reps   Seated Marches 4# x 30 reps   Seated Hip Abduction    Seated Hip Adduction    Seated Ham Curls    STSs    Goal Assessment See Below    Blank cell = exercise not performed today   06/02/24 Nustep L2 x 10 min UEs/LEs Supine ankle pumps x20 Manual therapy: patellar mobilization, quad STM & TPR Supine SAQ x20 Supine heel slide x20 Supine hip abd x20 Modalities: LE elevated, vasopneumatic on low and IFC at 80-150 Hz on 40% scan x 20 minutes to patient's right knee.  Normal modality resposne following removal of modality.    05/31/24:    LE elevation and vasopneumatic on  low and IFC at 80-150 Hz on 40% scan x 20 minutes to patient's right knee.  Normal modality resposne following removal of modality.   PATIENT EDUCATION:  Education details: Discussed therapeutic interventions to help decrease pain and therex that avoid pain increases.   Person educated: Patient Education method: Explanation Education comprehension: verbalized understanding  HOME EXERCISE PROGRAM:   ASSESSMENT:  CLINICAL IMPRESSION: Pt arrives for today's treatment session, 10 mins late, denying any pain.  Pt able to increase LEFs score to 55/80, surpassing her LTG.  Pt unable to perform reciprocal pattern when navigating stairs and experiences 4/10 right knee pain post performance of stairs.  Pt able to tolerate increased time with balance exercises today with min use of BUE support.  Pt requiring min/mod cues for technique with previously performed exercises.    ACTIVITY LIMITATIONS: carrying, lifting, bending, standing, stairs, and locomotion level  PARTICIPATION LIMITATIONS: meal prep, cleaning, laundry, community activity, and yard work  PERSONAL FACTORS: Time since onset of injury/illness/exacerbation are also affecting patient's functional outcome.   REHAB POTENTIAL: Good  CLINICAL DECISION MAKING: Evolving/moderate complexity  EVALUATION COMPLEXITY: Low   GOALS:  SHORT TERM GOALS: Target date: 06/14/24  Ind with an initial HEP. Goal status: MET   LONG TERM GOALS: Target date: 07/12/24.  Ind with an advanced HEP.  Goal status: IN PROGRESS  2.  Perform ADL's with right knee pain not > 3/10.   10/22: < 3/10 Goal status: MET  3.  Perform a reciprocating stair gait with on railing with pain not > 3/10.   10/22: step to pattern, 3/10 pain Goal status: IN PROGRESS  4.  Improve LEFS score by at least 10 points.   10/22: 55/80 Goal status: MET  PLAN:  PT FREQUENCY: 2x/week  PT DURATION: 6 weeks  PLANNED INTERVENTIONS: 97110-Therapeutic exercises, 97530-  Therapeutic activity, [id number]- Neuromuscular re-education, 97535- Self Care, 02859- Manual therapy, G0283- Electrical stimulation (unattended), 97016- Vasopneumatic device, 97035- Ultrasound, Patient/Family education, Cryotherapy, and Moist heat  PLAN FOR NEXT SESSION: Patellar mobility, Nustep, no pain increase therex (O and CKC).     [name], PTA 06/28/2024, 11:55 AM   Progress Note Reporting Period 05/31/24 to 06/28/24.  See note below for Objective Data and Assessment of Progress/Goals. Good progress with LTG's #2 and 4 met at this time.    Chad Applegate MPT

## 2024-06-28 NOTE — Addendum Note (Signed)
 Addended by: Markeda Narvaez, [place] W on: 06/28/2024 01:24 PM   Modules accepted: Orders

## 2024-07-03 ENCOUNTER — Ambulatory Visit

## 2024-07-03 DIAGNOSIS — M25661 Stiffness of right knee, not elsewhere classified: Secondary | ICD-10-CM

## 2024-07-03 DIAGNOSIS — M25561 Pain in right knee: Secondary | ICD-10-CM | POA: Diagnosis not present

## 2024-07-03 DIAGNOSIS — G8929 Other chronic pain: Secondary | ICD-10-CM

## 2024-07-03 DIAGNOSIS — R600 Localized edema: Secondary | ICD-10-CM

## 2024-07-03 NOTE — Therapy (Signed)
 OUTPATIENT PHYSICAL THERAPY LOWER EXTREMITY TREATMENT   Patient Name: Rhonda Daniel MRN: [id number] DOB:1951-03-08, 73 y.o., female Today's Date: 07/03/2024  END OF SESSION:  PT End of Session - 07/03/24 1110     Visit Number 11    Number of Visits 16    Date for Recertification  08/02/24    Authorization Type [employer]    PT Start Time 1100    PT Stop Time 1146    PT Time Calculation (min) 46 min    Activity Tolerance Patient tolerated treatment well    Behavior During Therapy WFL for tasks assessed/performed           Past Medical History:  Diagnosis Date   Anxiety    Hyperlipidemia    Hypertension    Sleep apnea    Was on CPAP.   Does not use now.    Past Surgical History:  Procedure Laterality Date   ABDOMINAL HYSTERECTOMY     APPENDECTOMY     with malignancy of appendix   CHOLECYSTECTOMY     COLON SURGERY     preventative - with appendix malignancy   HERNIA REPAIR     Patient Active Problem List   Diagnosis Date Noted   Generalized anxiety disorder with panic attacks 07/27/2023   Caregiver with fatigue 01/14/2017   Hyperlipidemia 04/02/2015   Morbid obesity (HCC) 08/06/2009   ESSENTIAL HYPERTENSION, BENIGN 08/06/2009   Sleep apnea 08/06/2009   Nonspecific abnormal results of cardiovascular function study 08/06/2009   REFERRING PROVIDER: Dickey Sprague PA-C  REFERRING DIAG: Right knee pain and arthritis.    THERAPY DIAG:  Chronic pain of right knee  Stiffness of right knee, not elsewhere classified  Localized edema  Rationale for Evaluation and Treatment: Rehabilitation  ONSET DATE: ~one year.   SUBJECTIVE:   SUBJECTIVE STATEMENT: Pt report 3/10 bil knee pain today.   PERTINENT HISTORY: See above.   PAIN:  Are you having pain? Yes: NPRS scale: 3/10  Pain location: bil knees  PRECAUTIONS: Other: No pain increasing therex (O and CKC).    RED FLAGS: None   WEIGHT BEARING RESTRICTIONS: No  FALLS:  Has patient fallen in last  6 months? No  LIVING ENVIRONMENT: Lives in: House/apartment Has following equipment at home: Single point cane  OCCUPATION: Retired.    PLOF: Independent and Independent with household mobility with device  PATIENT GOALS: Reduce pain.    OBJECTIVE:   PATIENT SURVEYS:  LEFS:  39/80.    EDEMA:  Circumferential: 3 cms greater on right than left.     POSTURE: Right lateral patellar tilt.    PALPATION: Patient's CC is tenderness in the region of her right knee lateral joint line.  LOWER EXTREMITY ROM: Full right knee extension and flexion to 100 degrees.  Notable crepitus.    LOWER EXTREMITY MMT: The patient exhibits a solid 4+/5 right hip and knee extension strength grade via manual muscle testing.    LOWER EXTREMITY SPECIAL TESTS:  Right knee exhibits good stability.  Significant decrease in right patellar mobility.    GAIT: Gait antalgia noted.  Patient is walking safely with a straight cane.  TREATMENT DATE:   07/03/24                                  EXERCISE LOG  Exercise Repetitions and Resistance Comments  Nustep  Lvl 3 x 15 mins   Rockerboard 5 mins   Lunges 14 box x 2.5 mins each   Standing Marches    Side-stepping    Tandem     Standing Heel/Toe Raises    Seated Heel Slides    LAQs 5# x 20 reps   Seated Marches 5# x 20 reps   Seated Hip Abduction Green x 2.5 mins   Seated Hip Adduction 4 mins   Seated Ham Curls Green x 20 reps   STSs    Goal Assessment     Blank cell = exercise not performed today   06/02/24 Nustep L2 x 10 min UEs/LEs Supine ankle pumps x20 Manual therapy: patellar mobilization, quad STM & TPR Supine SAQ x20 Supine heel slide x20 Supine hip abd x20 Modalities: LE elevated, vasopneumatic on low and IFC at 80-150 Hz on 40% scan x 20 minutes to patient's right knee.  Normal modality resposne following  removal of modality.    05/31/24:    LE elevation and vasopneumatic on low and IFC at 80-150 Hz on 40% scan x 20 minutes to patient's right knee.  Normal modality resposne following removal of modality.   PATIENT EDUCATION:  Education details: Discussed therapeutic interventions to help decrease pain and therex that avoid pain increases.   Person educated: Patient Education method: Explanation Education comprehension: verbalized understanding  HOME EXERCISE PROGRAM:   ASSESSMENT:  CLINICAL IMPRESSION: Pt arrives for today's treatment session reporting 3/10 bil knee pain.  Pt attributes increased pain to cold, rainy weather today.  Pt able tolerate increased weight or reps with all seated exercises today.  Pt declines balance exercises today due to increased pain.  Pt reports decreased pain and stiffness after performance of lunges.  Pt reported minimal decrease in pain at completion of today's treatment session.   ACTIVITY LIMITATIONS: carrying, lifting, bending, standing, stairs, and locomotion level  PARTICIPATION LIMITATIONS: meal prep, cleaning, laundry, community activity, and yard work  PERSONAL FACTORS: Time since onset of injury/illness/exacerbation are also affecting patient's functional outcome.   REHAB POTENTIAL: Good  CLINICAL DECISION MAKING: Evolving/moderate complexity  EVALUATION COMPLEXITY: Low   GOALS:  SHORT TERM GOALS: Target date: 06/14/24  Ind with an initial HEP. Goal status: MET   LONG TERM GOALS: Target date: 07/12/24.  Ind with an advanced HEP.  Goal status: IN PROGRESS  2.  Perform ADL's with right knee pain not > 3/10.   10/22: < 3/10 Goal status: MET  3.  Perform a reciprocating stair gait with on railing with pain not > 3/10.   10/22: step to pattern, 3/10 pain Goal status: IN PROGRESS  4.  Improve LEFS score by at least 10 points.   10/22: 55/80 Goal status: MET  PLAN:  PT FREQUENCY: 2x/week  PT DURATION: 6 weeks  PLANNED  INTERVENTIONS: 97110-Therapeutic exercises, 97530- Therapeutic activity, [id number]- Neuromuscular re-education, 97535- Self Care, 02859- Manual therapy, G0283- Electrical stimulation (unattended), 97016- Vasopneumatic device, 97035- Ultrasound, Patient/Family education, Cryotherapy, and Moist heat  PLAN FOR NEXT SESSION: Patellar mobility, Nustep, no pain increase therex (O and CKC).     [name], PTA 07/03/2024, 11:49 AM

## 2024-07-05 ENCOUNTER — Ambulatory Visit

## 2024-07-05 DIAGNOSIS — M25661 Stiffness of right knee, not elsewhere classified: Secondary | ICD-10-CM

## 2024-07-05 DIAGNOSIS — G8929 Other chronic pain: Secondary | ICD-10-CM

## 2024-07-05 DIAGNOSIS — R600 Localized edema: Secondary | ICD-10-CM

## 2024-07-05 DIAGNOSIS — M25561 Pain in right knee: Secondary | ICD-10-CM | POA: Diagnosis not present

## 2024-07-05 NOTE — Therapy (Signed)
 OUTPATIENT PHYSICAL THERAPY LOWER EXTREMITY TREATMENT   Patient Name: Rhonda Daniel MRN: [id number] DOB:[birth year], 73 y.o., female Today's Date: 07/05/2024  END OF SESSION:  PT End of Session - 07/05/24 1103     Visit Number 12    Number of Visits 16    Date for Recertification  08/02/24    Authorization Type [employer]    PT Start Time 1100    PT Stop Time 1147    PT Time Calculation (min) 47 min    Activity Tolerance Patient tolerated treatment well    Behavior During Therapy WFL for tasks assessed/performed           Past Medical History:  Diagnosis Date   Anxiety    Hyperlipidemia    Hypertension    Sleep apnea    Was on CPAP.   Does not use now.    Past Surgical History:  Procedure Laterality Date   ABDOMINAL HYSTERECTOMY     APPENDECTOMY     with malignancy of appendix   CHOLECYSTECTOMY     COLON SURGERY     preventative - with appendix malignancy   HERNIA REPAIR     Patient Active Problem List   Diagnosis Date Noted   Generalized anxiety disorder with panic attacks 07/27/2023   Caregiver with fatigue 01/14/2017   Hyperlipidemia 04/02/2015   Morbid obesity (HCC) 08/06/2009   ESSENTIAL HYPERTENSION, BENIGN 08/06/2009   Sleep apnea 08/06/2009   Nonspecific abnormal results of cardiovascular function study 08/06/2009   REFERRING PROVIDER: Dickey Sprague PA-C  REFERRING DIAG: Right knee pain and arthritis.    THERAPY DIAG:  Chronic pain of right knee  Stiffness of right knee, not elsewhere classified  Localized edema  Rationale for Evaluation and Treatment: Rehabilitation  ONSET DATE: ~one year.   SUBJECTIVE:   SUBJECTIVE STATEMENT: Pt report 3/10 bil knee pain today.   PERTINENT HISTORY: See above.   PAIN:  Are you having pain? Yes: NPRS scale: 3/10  Pain location: bil knees  PRECAUTIONS: Other: No pain increasing therex (O and CKC).    RED FLAGS: None   WEIGHT BEARING RESTRICTIONS: No  FALLS:  Has patient fallen in last  6 months? No  LIVING ENVIRONMENT: Lives in: House/apartment Has following equipment at home: Single point cane  OCCUPATION: Retired.    PLOF: Independent and Independent with household mobility with device  PATIENT GOALS: Reduce pain.    OBJECTIVE:   PATIENT SURVEYS:  LEFS:  39/80.    EDEMA:  Circumferential: 3 cms greater on right than left.     POSTURE: Right lateral patellar tilt.    PALPATION: Patient's CC is tenderness in the region of her right knee lateral joint line.  LOWER EXTREMITY ROM: Full right knee extension and flexion to 100 degrees.  Notable crepitus.    LOWER EXTREMITY MMT: The patient exhibits a solid 4+/5 right hip and knee extension strength grade via manual muscle testing.    LOWER EXTREMITY SPECIAL TESTS:  Right knee exhibits good stability.  Significant decrease in right patellar mobility.    GAIT: Gait antalgia noted.  Patient is walking safely with a straight cane.  TREATMENT DATE:   07/05/24                                  EXERCISE LOG  Exercise Repetitions and Resistance Comments  Nustep  Lvl 3 x 15 mins   Rockerboard 5 mins   Lunges 14 box x 2.5 mins each   Standing Marches    Side-stepping Airex x 4 mins   Tandem Gait Airex x 4 mins   Standing Heel/Toe Raises    Seated Heel Slides    LAQs 5# x 25 reps   Seated Marches 5# x 25 reps   Seated Hip Abduction Green x 3 mins   Seated Hip Adduction    Seated Ham Curls Green x 25 reps   STSs    Goal Assessment     Blank cell = exercise not performed today   06/02/24 Nustep L2 x 10 min UEs/LEs Supine ankle pumps x20 Manual therapy: patellar mobilization, quad STM & TPR Supine SAQ x20 Supine heel slide x20 Supine hip abd x20 Modalities: LE elevated, vasopneumatic on low and IFC at 80-150 Hz on 40% scan x 20 minutes to patient's right knee.  Normal  modality resposne following removal of modality.    05/31/24:    LE elevation and vasopneumatic on low and IFC at 80-150 Hz on 40% scan x 20 minutes to patient's right knee.  Normal modality resposne following removal of modality.   PATIENT EDUCATION:  Education details: Discussed therapeutic interventions to help decrease pain and therex that avoid pain increases.   Person educated: Patient Education method: Explanation Education comprehension: verbalized understanding  HOME EXERCISE PROGRAM:   ASSESSMENT:  CLINICAL IMPRESSION: Pt arrives for today's treatment session reporting very minimal bil knee pain.  Pt reintroduced to balance exercises today with minimal use of UE support for safety and stability.  Pt able to tolerate increased reps or time with all seated exercises today.  Pt denied any pain at completion of today's treatment session.   ACTIVITY LIMITATIONS: carrying, lifting, bending, standing, stairs, and locomotion level  PARTICIPATION LIMITATIONS: meal prep, cleaning, laundry, community activity, and yard work  PERSONAL FACTORS: Time since onset of injury/illness/exacerbation are also affecting patient's functional outcome.   REHAB POTENTIAL: Good  CLINICAL DECISION MAKING: Evolving/moderate complexity  EVALUATION COMPLEXITY: Low   GOALS:  SHORT TERM GOALS: Target date: 06/14/24  Ind with an initial HEP. Goal status: MET   LONG TERM GOALS: Target date: 07/12/24.  Ind with an advanced HEP.  Goal status: IN PROGRESS  2.  Perform ADL's with right knee pain not > 3/10.   10/22: < 3/10 Goal status: MET  3.  Perform a reciprocating stair gait with on railing with pain not > 3/10.   10/22: step to pattern, 3/10 pain Goal status: IN PROGRESS  4.  Improve LEFS score by at least 10 points.   10/22: 55/80 Goal status: MET  PLAN:  PT FREQUENCY: 2x/week  PT DURATION: 6 weeks  PLANNED INTERVENTIONS: 97110-Therapeutic exercises, 97530- Therapeutic activity,  [id number]- Neuromuscular re-education, 97535- Self Care, 02859- Manual therapy, G0283- Electrical stimulation (unattended), 97016- Vasopneumatic device, 97035- Ultrasound, Patient/Family education, Cryotherapy, and Moist heat  PLAN FOR NEXT SESSION: Patellar mobility, Nustep, no pain increase therex (O and CKC).     Rhonda Daniel, PTA 07/05/2024, 11:50 AM

## 2024-07-10 ENCOUNTER — Ambulatory Visit: Attending: Physician Assistant

## 2024-07-12 ENCOUNTER — Observation Stay (HOSPITAL_COMMUNITY)
Admission: EM | Admit: 2024-07-12 | Discharge: 2024-07-14 | Disposition: A | Discharge status: Skilled Nursing Facility | Attending: Family Medicine | Admitting: Family Medicine

## 2024-07-12 ENCOUNTER — Encounter (HOSPITAL_COMMUNITY): Payer: Self-pay

## 2024-07-12 ENCOUNTER — Ambulatory Visit

## 2024-07-12 ENCOUNTER — Observation Stay (HOSPITAL_COMMUNITY)

## 2024-07-12 ENCOUNTER — Emergency Department (HOSPITAL_COMMUNITY)

## 2024-07-12 ENCOUNTER — Other Ambulatory Visit: Payer: Self-pay

## 2024-07-12 DIAGNOSIS — I10 Essential (primary) hypertension: Secondary | ICD-10-CM | POA: Diagnosis present

## 2024-07-12 DIAGNOSIS — Z79899 Other long term (current) drug therapy: Secondary | ICD-10-CM | POA: Diagnosis not present

## 2024-07-12 DIAGNOSIS — R55 Syncope and collapse: Secondary | ICD-10-CM | POA: Diagnosis present

## 2024-07-12 DIAGNOSIS — R7401 Elevation of levels of liver transaminase levels: Secondary | ICD-10-CM | POA: Diagnosis not present

## 2024-07-12 DIAGNOSIS — R4182 Altered mental status, unspecified: Secondary | ICD-10-CM

## 2024-07-12 DIAGNOSIS — E785 Hyperlipidemia, unspecified: Secondary | ICD-10-CM | POA: Insufficient documentation

## 2024-07-12 DIAGNOSIS — M6281 Muscle weakness (generalized): Secondary | ICD-10-CM | POA: Diagnosis not present

## 2024-07-12 DIAGNOSIS — G9341 Metabolic encephalopathy: Secondary | ICD-10-CM | POA: Insufficient documentation

## 2024-07-12 DIAGNOSIS — E162 Hypoglycemia, unspecified: Secondary | ICD-10-CM | POA: Diagnosis not present

## 2024-07-12 DIAGNOSIS — M6282 Rhabdomyolysis: Secondary | ICD-10-CM | POA: Insufficient documentation

## 2024-07-12 DIAGNOSIS — Z7982 Long term (current) use of aspirin: Secondary | ICD-10-CM | POA: Insufficient documentation

## 2024-07-12 DIAGNOSIS — E860 Dehydration: Secondary | ICD-10-CM | POA: Diagnosis not present

## 2024-07-12 DIAGNOSIS — R2689 Other abnormalities of gait and mobility: Secondary | ICD-10-CM | POA: Insufficient documentation

## 2024-07-12 DIAGNOSIS — R339 Retention of urine, unspecified: Secondary | ICD-10-CM | POA: Diagnosis not present

## 2024-07-12 DIAGNOSIS — R238 Other skin changes: Secondary | ICD-10-CM | POA: Diagnosis not present

## 2024-07-12 DIAGNOSIS — F419 Anxiety disorder, unspecified: Secondary | ICD-10-CM | POA: Diagnosis not present

## 2024-07-12 DIAGNOSIS — Z6841 Body Mass Index (BMI) 40.0 and over, adult: Secondary | ICD-10-CM | POA: Diagnosis not present

## 2024-07-12 DIAGNOSIS — E870 Hyperosmolality and hypernatremia: Secondary | ICD-10-CM | POA: Insufficient documentation

## 2024-07-12 HISTORY — DX: Altered mental status, unspecified: R41.82

## 2024-07-12 LAB — CBC WITH DIFFERENTIAL/PLATELET
Abs Immature Granulocytes: 0.06 K/uL (ref 0.00–0.07)
Basophils Absolute: 0 K/uL (ref 0.0–0.1)
Basophils Relative: 0 %
Eosinophils Absolute: 0 K/uL (ref 0.0–0.5)
Eosinophils Relative: 0 %
HCT: 41.6 % (ref 36.0–46.0)
Hemoglobin: 13.5 g/dL (ref 12.0–15.0)
Immature Granulocytes: 0 %
Lymphocytes Relative: 5 %
Lymphs Abs: 0.6 K/uL — ABNORMAL LOW (ref 0.7–4.0)
MCH: 31.2 pg (ref 26.0–34.0)
MCHC: 32.5 g/dL (ref 30.0–36.0)
MCV: 96.1 fL (ref 80.0–100.0)
Monocytes Absolute: 1 K/uL (ref 0.1–1.0)
Monocytes Relative: 7 %
Neutro Abs: 11.7 K/uL — ABNORMAL HIGH (ref 1.7–7.7)
Neutrophils Relative %: 88 %
Platelets: 188 K/uL (ref 150–400)
RBC: 4.33 MIL/uL (ref 3.87–5.11)
RDW: 13.9 % (ref 11.5–15.5)
WBC: 13.4 K/uL — ABNORMAL HIGH (ref 4.0–10.5)
nRBC: 0 % (ref 0.0–0.2)

## 2024-07-12 LAB — URINALYSIS, ROUTINE W REFLEX MICROSCOPIC
Bilirubin Urine: NEGATIVE
Glucose, UA: NEGATIVE mg/dL
Ketones, ur: 5 mg/dL — AB
Nitrite: NEGATIVE
Protein, ur: 100 mg/dL — AB
Specific Gravity, Urine: 1.028 (ref 1.005–1.030)
pH: 6 (ref 5.0–8.0)

## 2024-07-12 LAB — URINE DRUG SCREEN
Amphetamines: NEGATIVE
Barbiturates: NEGATIVE
Benzodiazepines: NEGATIVE
Cocaine: NEGATIVE
Fentanyl: NEGATIVE
Methadone Scn, Ur: NEGATIVE
Opiates: NEGATIVE
Tetrahydrocannabinol: NEGATIVE

## 2024-07-12 LAB — COMPREHENSIVE METABOLIC PANEL WITH GFR
ALT: 60 U/L — ABNORMAL HIGH (ref 0–44)
AST: 89 U/L — ABNORMAL HIGH (ref 15–41)
Albumin: 4 g/dL (ref 3.5–5.0)
Alkaline Phosphatase: 72 U/L (ref 38–126)
Anion gap: 12 (ref 5–15)
BUN: 61 mg/dL — ABNORMAL HIGH (ref 8–23)
CO2: 25 mmol/L (ref 22–32)
Calcium: 9.6 mg/dL (ref 8.9–10.3)
Chloride: 106 mmol/L (ref 98–111)
Creatinine, Ser: 0.9 mg/dL (ref 0.44–1.00)
GFR, Estimated: >60 mL/min (ref >60)
Glucose, Bld: 103 mg/dL — ABNORMAL HIGH (ref 70–99)
Potassium: 4.2 mmol/L (ref 3.5–5.1)
Sodium: 143 mmol/L (ref 135–145)
Total Bilirubin: 1.1 mg/dL (ref 0.0–1.2)
Total Protein: 7 g/dL (ref 6.5–8.1)

## 2024-07-12 LAB — AMMONIA: Ammonia: 37 umol/L — ABNORMAL HIGH (ref 9–35)

## 2024-07-12 LAB — VITAMIN B12: Vitamin B-12: 642 pg/mL (ref 180–914)

## 2024-07-12 LAB — TSH: TSH: 0.703 u[IU]/mL (ref 0.350–4.500)

## 2024-07-12 LAB — CBG MONITORING, ED
Glucose-Capillary: 55 mg/dL — ABNORMAL LOW (ref 70–99)
Glucose-Capillary: 143 mg/dL — ABNORMAL HIGH (ref 70–99)

## 2024-07-12 LAB — CK: Total CK: 1620 U/L — ABNORMAL HIGH (ref 38–234)

## 2024-07-12 LAB — HEMOGLOBIN A1C
Hgb A1c MFr Bld: 4.7 % — ABNORMAL LOW (ref 4.8–5.6)
Mean Plasma Glucose: 88.19 mg/dL

## 2024-07-12 MED ORDER — MORPHINE SULFATE (PF) 2 MG/ML IV SOLN: 1.0000 mg | INTRAVENOUS | Status: DC | PRN

## 2024-07-12 MED ORDER — SODIUM CHLORIDE 0.9 % IV SOLN
1.0000 g | INTRAVENOUS | Status: DC
  Administered 2024-07-12 – 2024-07-13 (×2): 1 g via INTRAVENOUS
  Filled 2024-07-12 (×2): qty 10

## 2024-07-12 MED ORDER — ALBUTEROL SULFATE (2.5 MG/3ML) 0.083% IN NEBU: 2.5000 mg | INHALATION_SOLUTION | RESPIRATORY_TRACT | Status: DC | PRN

## 2024-07-12 MED ORDER — LORAZEPAM 2 MG/ML IJ SOLN
1.0000 mg | Freq: Once | INTRAMUSCULAR | Status: AC
  Administered 2024-07-12: 1 mg via INTRAVENOUS

## 2024-07-12 MED ORDER — SODIUM CHLORIDE 0.9 % IV SOLN: INTRAVENOUS | Status: DC

## 2024-07-12 MED ORDER — THIAMINE MONONITRATE 100 MG PO TABS
200.0000 mg | ORAL_TABLET | Freq: Every day | ORAL | Status: DC
  Administered 2024-07-13 – 2024-07-14 (×2): 200 mg via ORAL
  Filled 2024-07-12 (×2): qty 2

## 2024-07-12 MED ORDER — HEPARIN SODIUM (PORCINE) 5000 UNIT/ML IJ SOLN
5000.0000 [IU] | Freq: Three times a day (TID) | INTRAMUSCULAR | Status: DC
  Administered 2024-07-12 – 2024-07-14 (×5): 5000 [IU] via SUBCUTANEOUS
  Filled 2024-07-12 (×5): qty 1

## 2024-07-12 MED ORDER — LORAZEPAM 2 MG/ML IJ SOLN
INTRAMUSCULAR | Status: AC
  Filled 2024-07-12: qty 1

## 2024-07-12 MED ORDER — SODIUM CHLORIDE 0.9 % IV BOLUS
1000.0000 mL | Freq: Once | INTRAVENOUS | Status: AC
  Administered 2024-07-12: 1000 mL via INTRAVENOUS

## 2024-07-12 MED ORDER — POLYETHYLENE GLYCOL 3350 17 G PO PACK: 17.0000 g | PACK | Freq: Every day | ORAL | Status: DC | PRN

## 2024-07-12 MED ORDER — DEXTROSE 50 % IV SOLN
1.0000 | Freq: Once | INTRAVENOUS | Status: AC
  Administered 2024-07-12: 50 mL via INTRAVENOUS
  Filled 2024-07-12: qty 50

## 2024-07-12 MED ORDER — ACETAMINOPHEN 650 MG RE SUPP: 650.0000 mg | Freq: Four times a day (QID) | RECTAL | Status: DC | PRN

## 2024-07-12 MED ORDER — HYDRALAZINE HCL 20 MG/ML IJ SOLN: 10.0000 mg | INTRAMUSCULAR | Status: DC | PRN

## 2024-07-12 MED ORDER — ACETAMINOPHEN 325 MG PO TABS: 650.0000 mg | ORAL_TABLET | Freq: Four times a day (QID) | ORAL | Status: DC | PRN

## 2024-07-12 MED ORDER — IOHEXOL 300 MG/ML  SOLN
100.0000 mL | Freq: Once | INTRAMUSCULAR | Status: AC | PRN
  Administered 2024-07-12: 100 mL via INTRAVENOUS

## 2024-07-12 MED ORDER — SENNA 8.6 MG PO TABS
1.0000 | ORAL_TABLET | Freq: Two times a day (BID) | ORAL | Status: DC
  Administered 2024-07-13 (×2): 8.6 mg via ORAL
  Filled 2024-07-12 (×4): qty 1

## 2024-07-12 MED ORDER — CHLORHEXIDINE GLUCONATE CLOTH 2 % EX PADS
6.0000 | MEDICATED_PAD | Freq: Every day | CUTANEOUS | Status: DC
  Administered 2024-07-12 – 2024-07-14 (×3): 6 via TOPICAL

## 2024-07-12 MED ORDER — THIAMINE HCL 100 MG/ML IJ SOLN
500.0000 mg | Freq: Once | INTRAVENOUS | Status: AC
  Administered 2024-07-12: 500 mg via INTRAVENOUS
  Filled 2024-07-12: qty 5

## 2024-07-12 NOTE — ED Notes (Signed)
 ED Provider at bedside.

## 2024-07-12 NOTE — ED Notes (Addendum)
 Dr. Chana made aware of attempt to in and out patient to empty bladder. Requested something to help patient relax from pain.

## 2024-07-12 NOTE — ED Notes (Signed)
 MD made aware of CBG 55.

## 2024-07-12 NOTE — ED Notes (Signed)
 Patient transported to CT

## 2024-07-12 NOTE — ED Triage Notes (Addendum)
 Patient BIB [facility] from home  for complaint of a syncope episode, unwitnessed fall and not sure if she hit her head. CBG 115. Has not taken her medications since Sunday. 96/65, initial BP with EMS second BP 163/90, Denises SOB.

## 2024-07-12 NOTE — H&P (Addendum)
 [facility] H&P   Patient Demographics:    Rhonda Daniel, is a 73 y.o. female  MRN: [id number]   DOB - 09-01-1951  Admit Date - 07/12/2024  Outpatient Primary MD for the patient is [name] HERO, DO  Referring MD/NP/PA: Dr Garrick  Patient coming from: home  Chief Complaint  Patient presents with   Fall   Loss of Consciousness      HPI:    Rhonda Daniel  is a 73 y.o. female, with past medical history of anxiety, hyperlipidemia, hypertension, apnea, not on CPAP anymore, was brought by EMS she was found on the floor, she is poor historian, so history was obtained from her niece by phone, and ED staff, apparently patient with memory problems, but still lives by herself, she was seen by neurologist 6 months ago where she was diagnosed with mild cognition impairment, family sent a friend to check on the patient as could not hear from her for last day, she was on on the floor by family friend, so EMS were called, patient herself cannot provide much history, she was found to have significant bruising around left hip and right knee area. - In ED her workup significant for BUN of 61, creatinine 0.9, AST elevated at 89, ALT elevated at 60, total CK at 1620, white blood cell count elevated at 13.4, her UA significant for mild pyuria, and positive for leukocytes, CT head with no acute findings, CBG in ED was noted to be at 55, [facility] hospitalist consulted to admit.    Review of systems:      A full 10 point Review of Systems was done, except as stated above, all other Review of Systems were negative.   With Past History of the following :    Past Medical History:  Diagnosis Date   Anxiety    Hyperlipidemia    Hypertension    Sleep apnea    Was on CPAP.   Does not use now.       Past Surgical History:  Procedure Laterality Date   ABDOMINAL HYSTERECTOMY     APPENDECTOMY     with  malignancy of appendix   CHOLECYSTECTOMY     COLON SURGERY     preventative - with appendix malignancy   HERNIA REPAIR        Social History:     Social History   Tobacco Use   Smoking status: Never   Smokeless tobacco: Never  Substance Use Topics   Alcohol use: No        Family History :     Family History  Problem Relation Age of Onset   Diabetes Mother    Hypertension Mother    Irregular heart beat Mother        pacemaker   Hearing loss Mother        right   Vision loss Mother  macular degeneration   Rheumatic fever Father        heart murmur   Pneumonia Father    Cancer - Cervical Sister    Heart disease Paternal Aunt    Cancer Paternal Aunt        tumor behind eye   Diabetes Maternal Grandmother    Vision loss Maternal Grandmother    Heart disease Paternal Grandfather    Breast cancer Neg Hx       Home Medications:   Prior to Admission medications   Medication Sig Start Date End Date Taking? Authorizing Provider  aspirin EC 81 MG tablet Take 81 mg by mouth daily. Swallow whole.   Yes [provider]  busPIRone  (BUSPAR ) 5 MG tablet Take 1 tablet (5 mg total) by mouth 3 (three) times daily. For anxiety/ panic 01/25/24   Jolinda Potter M, DO  levocetirizine (XYZAL ) 5 MG tablet Take 1 tablet (5 mg total) by mouth every evening. 09/10/23   Jolinda Potter HERO, DO  lisinopril  (ZESTRIL ) 20 MG tablet Take 1 tablet (20 mg total) by mouth daily. 09/10/23   Jolinda Potter HERO, DO  meloxicam (MOBIC) 7.5 MG tablet Take 7.5 mg by mouth in the morning and at bedtime. 05/25/24   [provider]  sertraline  (ZOLOFT ) 50 MG tablet Take 1 tablet (50 mg total) by mouth daily. 12/20/23   Jolinda Potter HERO, DO  simvastatin  (ZOCOR ) 20 MG tablet TAKE ONE TABLET DAILY AT 6PM 09/10/23   Jolinda Potter M, DO  Vitamin D , Ergocalciferol , (DRISDOL ) 1.25 MG (50000 UNIT) CAPS capsule TAKE 1 CAPSULE (50,000 UNITS TOTAL) BY MOUTH EVERY 7 (SEVEN) DAYS 11/23/23    Gottschalk, Ashly M, DO     Allergies:     Allergies  Allergen Reactions   Bee Venom Swelling and Other (See Comments)    Local redness      Physical Exam:   Vitals  Blood pressure 94/76, pulse 86, temperature 98.1 F (36.7 C), temperature source Oral, resp. rate 16, height 5' 6 (1.676 m), weight 117.9 kg, SpO2 97%.   1. General Appearing female, laying in bed, no apparent distress  2.  Awake, alert x 1, impaired judgment and insight  3. No F.N deficits, ALL C.Nerves Intact, Strength 5/5 all 4 extremities, Sensation intact all 4 extremities, Plantars down going.  4. Ears and Eyes appear Normal, Conjunctivae clear, PERRLA. Moist Oral Mucosa.  5. Supple Neck, No JVD, No cervical lymphadenopathy appriciated, No Carotid Bruits.  6. Symmetrical Chest wall movement, Good air movement bilaterally, CTAB.  7. RRR, No Gallops, Rubs or Murmurs, No Parasternal Heave.  8. Positive Bowel Sounds, Abdomen Soft, suprapubic fullness,, No organomegaly appriciated,No rebound -guarding or rigidity.  9.  No Cyanosis, Normal Skin Turgor, has skin bruising in the left hip/right knee area  10. Good muscle tone,  joints appear normal , no effusions, Normal ROM.    Data Review:    CBC Recent Labs  Lab 07/12/24 1302  WBC 13.4*  HGB 13.5  HCT 41.6  PLT 188  MCV 96.1  MCH 31.2  MCHC 32.5  RDW 13.9  LYMPHSABS 0.6*  MONOABS 1.0  EOSABS 0.0  BASOSABS 0.0   ------------------------------------------------------------------------------------------------------------------  Chemistries  Recent Labs  Lab 07/12/24 1302  NA 143  K 4.2  CL 106  CO2 25  GLUCOSE 103*  BUN 61*  CREATININE 0.90  CALCIUM 9.6  AST 89*  ALT 60*  ALKPHOS 72  BILITOT 1.1   ------------------------------------------------------------------------------------------------------------------ estimated creatinine clearance is 71.6 mL/min (  by C-G formula based on SCr of 0.9  mg/dL). ------------------------------------------------------------------------------------------------------------------ No results for input(s): TSH, T4TOTAL, T3FREE, THYROIDAB in the last 72 hours.  Invalid input(s): FREET3  Coagulation profile No results for input(s): INR, PROTIME in the last 168 hours. ------------------------------------------------------------------------------------------------------------------- No results for input(s): DDIMER in the last 72 hours. -------------------------------------------------------------------------------------------------------------------  Cardiac Enzymes No results for input(s): CKMB, TROPONINI, MYOGLOBIN in the last 168 hours.  Invalid input(s): CK ------------------------------------------------------------------------------------------------------------------ No results found for: BNP   ---------------------------------------------------------------------------------------------------------------  Urinalysis    Component Value Date/Time   COLORURINE AMBER (A) 07/12/2024 1530   APPEARANCEUR HAZY (A) 07/12/2024 1530   APPEARANCEUR Clear 06/21/2023 1050   LABSPEC 1.028 07/12/2024 1530   PHURINE 6.0 07/12/2024 1530   GLUCOSEU NEGATIVE 07/12/2024 1530   HGBUR SMALL (A) 07/12/2024 1530   BILIRUBINUR NEGATIVE 07/12/2024 1530   BILIRUBINUR Negative 06/21/2023 1050   KETONESUR 5 (A) 07/12/2024 1530   PROTEINUR 100 (A) 07/12/2024 1530   NITRITE NEGATIVE 07/12/2024 1530   LEUKOCYTESUR MODERATE (A) 07/12/2024 1530    ----------------------------------------------------------------------------------------------------------------   Imaging Results:    DG Chest Port 1 View Result Date: 07/12/2024 EXAM: 1 VIEW(S) XRAY OF THE CHEST 07/12/2024 12:40:00 PM COMPARISON: None available. CLINICAL HISTORY: LH? FINDINGS: LUNGS AND PLEURA: No focal pulmonary opacity. No pulmonary edema. No pleural effusion. No  pneumothorax. HEART AND MEDIASTINUM: Calcification of transverse aorta. BONES AND SOFT TISSUES: Thoracic spondylosis. Degenerative acromioclavicular joint arthropathy on the right. IMPRESSION: 1. No acute cardiopulmonary process. 2. Thoracic spondylosis. 3. Degenerative right acromioclavicular joint arthropathy. 4. Aortic atherosclerosis. Electronically signed by: Ryan Salvage MD 07/12/2024 01:27 PM EST RP Workstation: [id number]   CT Head Wo Contrast Result Date: 07/12/2024 EXAM: CT HEAD WITHOUT CONTRAST 07/12/2024 01:13:00 PM TECHNIQUE: CT of the head was performed without the administration of intravenous contrast. Automated exposure control, iterative reconstruction, and/or weight based adjustment of the mA/kV was utilized to reduce the radiation dose to as low as reasonably achievable. COMPARISON: None available. CLINICAL HISTORY: Memory loss; Mental status change, unknown cause. FINDINGS: BRAIN AND VENTRICLES: No acute hemorrhage. No evidence of acute infarct. No hydrocephalus. No extra-axial collection. No mass effect or midline shift. ORBITS: No acute abnormality. SINUSES: No acute abnormality. SOFT TISSUES AND SKULL: No acute soft tissue abnormality. No skull fracture. IMPRESSION: 1. No acute intracranial abnormality. Electronically signed by: Franky Stanford MD 07/12/2024 01:16 PM EST RP Workstation: [id number]    EKG:  Sinus rhythm 82 bpm( it reads as A-fib but it is not) with QTc of 459,    Assessment & Plan:    Principal Problem:   AMS (altered mental status) Active Problems:   Morbid obesity (HCC)   Essential hypertension, benign   Hyperlipidemia    Altered mental status Confusion Fall Mild rhabdomyolysis -Progressive decline in mental status, recently diagnosed with alternative impairment by neurologist. - CT head with no acute findings - Check B12, ammonia, folic acid, RPR and TSH - Consult PT/OT - Was on the floor for unknown period of time, delirium related to fall, or  syncope - Consult PT/OT - Total CK mildly elevated continue with IV fluids - Check urine drug screen,  - urinalysis  mildly positive, I will start on Rocephin Shelle she has urinary retention - Patient is significantly confused, for now I will keep holding her BuSpar , Xyzal , sertraline  - I will keep her n.p.o. for now start on diet when more awake  Hypoglycemia -noted to be hypoglycemic at 55, no history of diabetes mellitus, not on any medication, will check A1c -  She received D50 in ED, continue to monitor  Hyperlipidemia - Continue with home statin  Hypertension - Hold lisinopril  for now and continue with as needed hydralazine  Morbid obesity - Body mass index is 41.97 kg/m.  Urinary retention  -Suprapubic fullness, bladder scan showing<700, will start Foley catheter  Transaminitis - AST/ALT mildly elevated, will recheck in a.m. - No history of alcohol abuse per family, but will check alcohol level  Addendum: - Difficult to place Foley as notified by staff as she has has been tensing up, she received Ativan, Foley was successful, so far 1000 cc were drained, Foley clamped and will release after 30 minutes to bladder Foley, - Right groin area noted with bruising, will obtain CT abdomen pelvis to rule out any pelvic fracture. - Right knee x-rays pending.  DVT Prophylaxis Heparin   AM Labs Ordered, also please review Full Orders  Family Communication: Admission, patients condition and plan of care including tests being ordered have been discussed with the patient and by phone who indicate understanding and agree with the plan and Code Status.  Code Status full code  Likely DC to pending PT/OT  Consults called: None  Admission status: Observation  Time spent in minutes : 70 minutes   Brayton Lye M.D on 07/12/2024 at 4:46 PM   [facility] - Office  [phone]

## 2024-07-12 NOTE — ED Notes (Signed)
 Dr. Chana made aware of redness to patients bottom, Bruising to right knee and left flank and patient unable to assist with rolling to side.

## 2024-07-12 NOTE — ED Provider Notes (Addendum)
 [place] EMERGENCY DEPARTMENT AT [facility] Provider Note   CSN: [id number] Arrival date & time: 07/12/24  1151     Patient presents with: Fall and Loss of Consciousness   Rhonda Daniel is a 73 y.o. female.   HPI Patient presents with 2 friends who assists with the history. Patient is noted to have a history of ongoing evaluation for memory loss over the past 6 months with a neurologist.  Today she was found on the ground by one of her friends.  Unclear when the patient went to the ground, the patient recalls only awakening in the hospital.  Last interaction with family or friends was 5 days ago.  Some evidence on the ground of the patient trying to scramble to get to her feet, no obvious trauma, the patient denies any pain, focal weakness, acknowledges ongoing chronic pain both knees, however. She is unsure of any recent medication changes, states that she typically takes them daily religiously, but is unsure about the last few days, however.     Prior to Admission medications   Medication Sig Start Date End Date Taking? Authorizing Provider  aspirin EC 81 MG tablet Take 81 mg by mouth daily. Swallow whole.   Yes [provider]  busPIRone  (BUSPAR ) 5 MG tablet Take 1 tablet (5 mg total) by mouth 3 (three) times daily. For anxiety/ panic 01/25/24   Jolinda Potter M, DO  levocetirizine (XYZAL ) 5 MG tablet Take 1 tablet (5 mg total) by mouth every evening. 09/10/23   Jolinda Potter HERO, DO  lisinopril  (ZESTRIL ) 20 MG tablet Take 1 tablet (20 mg total) by mouth daily. 09/10/23   Jolinda Potter HERO, DO  meloxicam (MOBIC) 7.5 MG tablet Take 7.5 mg by mouth in the morning and at bedtime. 05/25/24   [provider]  sertraline  (ZOLOFT ) 50 MG tablet Take 1 tablet (50 mg total) by mouth daily. 12/20/23   Jolinda Potter HERO, DO  simvastatin  (ZOCOR ) 20 MG tablet TAKE ONE TABLET DAILY AT 6PM 09/10/23   Jolinda Potter M, DO  Vitamin D , Ergocalciferol , (DRISDOL ) 1.25 MG  (50000 UNIT) CAPS capsule TAKE 1 CAPSULE (50,000 UNITS TOTAL) BY MOUTH EVERY 7 (SEVEN) DAYS 11/23/23   Gottschalk, Ashly M, DO    Allergies: Bee venom    Review of Systems  Updated Vital Signs BP 130/80   Pulse 69   Temp 98.1 F (36.7 C) (Oral)   Resp 16   Ht 1.676 m (5' 6)   Wt 117.9 kg   SpO2 98%   BMI 41.97 kg/m   Physical Exam Vitals and nursing note reviewed.  Constitutional:      General: She is not in acute distress.    Appearance: She is well-developed. She is obese. She is not ill-appearing or diaphoretic.  HENT:     Head: Normocephalic and atraumatic.  Eyes:     Conjunctiva/sclera: Conjunctivae normal.  Cardiovascular:     Rate and Rhythm: Normal rate and regular rhythm.  Pulmonary:     Effort: Pulmonary effort is normal. No respiratory distress.     Breath sounds: Normal breath sounds. No stridor.  Abdominal:     General: There is no distension.  Musculoskeletal:     Comments: Patient flexes each hip, both knees and both ankles freely, limited flexion of both knees secondary to pain.  Skin:    General: Skin is warm and dry.  Neurological:     Mental Status: She is alert and oriented to person, place, and  time.     Cranial Nerves: No cranial nerve deficit.  Psychiatric:        Mood and Affect: Mood normal.        Thought Content: Thought content normal.     (all labs ordered are listed, but only abnormal results are displayed) Labs Reviewed  COMPREHENSIVE METABOLIC PANEL WITH GFR - Abnormal; Notable for the following components:      Result Value   Glucose, Bld 103 (*)    BUN 61 (*)    AST 89 (*)    ALT 60 (*)    All other components within normal limits  CBC WITH DIFFERENTIAL/PLATELET - Abnormal; Notable for the following components:   WBC 13.4 (*)    Neutro Abs 11.7 (*)    Lymphs Abs 0.6 (*)    All other components within normal limits  CK - Abnormal; Notable for the following components:   Total CK 1,620 (*)    All other components within  normal limits  CBG MONITORING, ED    EKG: EKG Interpretation Date/Time:  Wednesday July 12 2024 14:29:00 EST Ventricular Rate:  82 PR Interval:    QRS Duration:  99 QT Interval:  393 QTC Calculation: 459 R Axis:   22  Text Interpretation: Sinus rhythm Artifact Confirmed by Garrick Charleston [id number]) on 07/12/2024 3:05:16 PM  Radiology: [facility] Chest Port 1 View Result Date: 07/12/2024 EXAM: 1 VIEW(S) XRAY OF THE CHEST 07/12/2024 12:40:00 PM COMPARISON: None available. CLINICAL HISTORY: LH? FINDINGS: LUNGS AND PLEURA: No focal pulmonary opacity. No pulmonary edema. No pleural effusion. No pneumothorax. HEART AND MEDIASTINUM: Calcification of transverse aorta. BONES AND SOFT TISSUES: Thoracic spondylosis. Degenerative acromioclavicular joint arthropathy on the right. IMPRESSION: 1. No acute cardiopulmonary process. 2. Thoracic spondylosis. 3. Degenerative right acromioclavicular joint arthropathy. 4. Aortic atherosclerosis. Electronically signed by: Ryan Salvage MD 07/12/2024 01:27 PM EST RP Workstation: [id number]   CT Head Wo Contrast Result Date: 07/12/2024 EXAM: CT HEAD WITHOUT CONTRAST 07/12/2024 01:13:00 PM TECHNIQUE: CT of the head was performed without the administration of intravenous contrast. Automated exposure control, iterative reconstruction, and/or weight based adjustment of the mA/kV was utilized to reduce the radiation dose to as low as reasonably achievable. COMPARISON: None available. CLINICAL HISTORY: Memory loss; Mental status change, unknown cause. FINDINGS: BRAIN AND VENTRICLES: No acute hemorrhage. No evidence of acute infarct. No hydrocephalus. No extra-axial collection. No mass effect or midline shift. ORBITS: No acute abnormality. SINUSES: No acute abnormality. SOFT TISSUES AND SKULL: No acute soft tissue abnormality. No skull fracture. IMPRESSION: 1. No acute intracranial abnormality. Electronically signed by: Franky Stanford MD 07/12/2024 01:16 PM EST RP Workstation:  [id number]     Procedures   Medications Ordered in the ED  sodium chloride 0.9 % bolus 1,000 mL (0 mLs Intravenous Stopped 07/12/24 1454)                                    Medical Decision Making Elderly female presents after unclear circumstances, having been found on the ground by family member after 5 days passed since last interaction with anybody. Broad differential including stroke, infection, dehydration, rhabdomyolysis. Patient's initial physical exam, neuroexam and vital signs are all reassuring, however. Cardiac 85 sinus normal pulse ox 99% room air normal  Amount and/or Complexity of Data Reviewed Independent Historian: friend External Data Reviewed: notes. Labs: ordered. Decision-making details documented in ED Course. Radiology: ordered and independent interpretation performed. Decision-making details documented  in ED Course. ECG/medicine tests: ordered and independent interpretation performed. Decision-making details documented in ED Course.  Risk Prescription drug management.    Patient with mild hypoglycemia, dextrose ordered.  3:05 PM Patient persistent ongoing confusion mild leukocytosis, and substantial elevation of creatinine kinase.  Patient has began to receive fluid resuscitation, has had no appreciable change in her condition. With concern for syncope, complicated by possible rhabdomyolysis with elevated CK, patient will continue fluid resuscitation, will be admitted for monitoring, management.     Final diagnoses:  Syncope and collapse  Dehydration     Garrick Charleston, MD 07/12/24 1505    Garrick Charleston, MD 07/12/24 1527

## 2024-07-12 NOTE — ED Notes (Signed)
 Hospitalist at bedside

## 2024-07-13 DIAGNOSIS — R4182 Altered mental status, unspecified: Secondary | ICD-10-CM | POA: Diagnosis not present

## 2024-07-13 LAB — CBC
HCT: 33.4 % — ABNORMAL LOW (ref 36.0–46.0)
Hemoglobin: 10.8 g/dL — ABNORMAL LOW (ref 12.0–15.0)
MCH: 31.4 pg (ref 26.0–34.0)
MCHC: 32.3 g/dL (ref 30.0–36.0)
MCV: 97.1 fL (ref 80.0–100.0)
Platelets: 163 K/uL (ref 150–400)
RBC: 3.44 MIL/uL — ABNORMAL LOW (ref 3.87–5.11)
RDW: 14 % (ref 11.5–15.5)
WBC: 8.2 K/uL (ref 4.0–10.5)
nRBC: 0 % (ref 0.0–0.2)

## 2024-07-13 LAB — COMPREHENSIVE METABOLIC PANEL WITH GFR
ALT: 45 U/L — ABNORMAL HIGH (ref 0–44)
AST: 56 U/L — ABNORMAL HIGH (ref 15–41)
Albumin: 3 g/dL — ABNORMAL LOW (ref 3.5–5.0)
Alkaline Phosphatase: 61 U/L (ref 38–126)
Anion gap: 8 (ref 5–15)
BUN: 41 mg/dL — ABNORMAL HIGH (ref 8–23)
CO2: 25 mmol/L (ref 22–32)
Calcium: 8.5 mg/dL — ABNORMAL LOW (ref 8.9–10.3)
Chloride: 113 mmol/L — ABNORMAL HIGH (ref 98–111)
Creatinine, Ser: 0.54 mg/dL (ref 0.44–1.00)
GFR, Estimated: >60 mL/min (ref >60)
Glucose, Bld: 83 mg/dL (ref 70–99)
Potassium: 3.5 mmol/L (ref 3.5–5.1)
Sodium: 146 mmol/L — ABNORMAL HIGH (ref 135–145)
Total Bilirubin: 0.8 mg/dL (ref 0.0–1.2)
Total Protein: 5.1 g/dL — ABNORMAL LOW (ref 6.5–8.1)

## 2024-07-13 LAB — GLUCOSE, CAPILLARY
Glucose-Capillary: 165 mg/dL — ABNORMAL HIGH (ref 70–99)
Glucose-Capillary: 94 mg/dL (ref 70–99)

## 2024-07-13 LAB — RPR: RPR Ser Ql: NONREACTIVE

## 2024-07-13 LAB — HIV ANTIBODY (ROUTINE TESTING W REFLEX): HIV Screen 4th Generation wRfx: NONREACTIVE

## 2024-07-13 MED ORDER — SERTRALINE HCL 50 MG PO TABS
50.0000 mg | ORAL_TABLET | Freq: Every day | ORAL | Status: DC
  Administered 2024-07-13 – 2024-07-14 (×2): 50 mg via ORAL
  Filled 2024-07-13 (×2): qty 1

## 2024-07-13 MED ORDER — BUSPIRONE HCL 15 MG PO TABS
7.5000 mg | ORAL_TABLET | Freq: Three times a day (TID) | ORAL | Status: DC
  Administered 2024-07-13 – 2024-07-14 (×3): 7.5 mg via ORAL
  Filled 2024-07-13: qty 1
  Filled 2024-07-13: qty 2
  Filled 2024-07-13: qty 1

## 2024-07-13 MED ORDER — DEXTROSE-SODIUM CHLORIDE 5-0.45 % IV SOLN: INTRAVENOUS | Status: DC

## 2024-07-13 NOTE — TOC Initial Note (Addendum)
 Transition of Care [facility]) - Initial/Assessment Note    Patient Details  Name: Rhonda Daniel MRN: [id number] Date of Birth: 12-12-1950  Transition of Care [facility]) CM/SW Contact:    Noreen KATHEE Pinal, LCSWA Phone Number: 07/13/2024, 2:02 PM  Clinical Narrative:                  Patent lives alone. Family at bedside. CSW spoke with patient and family before lunch about PT recommendation for SNF. Family and patient agreeable and asked for referral to be sent to [facility]. CSW sent referral via HUB. Also shared with family and patient that insurance will need to be in network with the facility, but would follow back up once bed offers are available for in-network facilities. CSW will continue to follow.   Addendum 3:47 pm   Family and patient chose [facility] for short-term rehab.    Expected Discharge Plan: Skilled Nursing Facility Barriers to Discharge: Continued Medical Work up   Patient Goals and CMS Choice Patient states their goals for this hospitalization and ongoing recovery are:: short-term rehab CMS Medicare.gov Compare Post Acute Care list provided to:: Patient Choice offered to / list presented to : Patient      Expected Discharge Plan and Services     Post Acute Care Choice: Durable Medical Equipment Living arrangements for the past 2 months: Single Family Home                                      Prior Living Arrangements/Services Living arrangements for the past 2 months: Single Family Home Lives with:: Self Patient language and need for interpreter reviewed:: Yes Do you feel safe going back to the place where you live?: No   Needs short-term rehab  Need for Family Participation in Patient Care: Yes (Comment) Care giver support system in place?: No (comment) Current home services: DME Criminal Activity/Legal Involvement Pertinent to Current Situation/Hospitalization: No - Comment as needed  Activities of Daily Living   ADL Screening (condition at time of  admission) Independently performs ADLs?: No Does the patient have a NEW difficulty with bathing/dressing/toileting/self-feeding that is expected to last >3 days?: Yes (Initiates electronic notice to provider for possible OT consult) Does the patient have a NEW difficulty with getting in/out of bed, walking, or climbing stairs that is expected to last >3 days?: Yes (Initiates electronic notice to provider for possible PT consult) Does the patient have a NEW difficulty with communication that is expected to last >3 days?: No Is the patient deaf or have difficulty hearing?: No Does the patient have difficulty seeing, even when wearing glasses/contacts?: No Does the patient have difficulty concentrating, remembering, or making decisions?: Yes  Permission Sought/Granted      Share Information with NAME: Reathel     Permission granted to share info w Relationship: Patient     Emotional Assessment Appearance:: Appears stated age Attitude/Demeanor/Rapport: Self-Confident Affect (typically observed): Accepting Orientation: : Oriented to Self, Oriented to Place   Psych Involvement: No (comment)  Admission diagnosis:  Syncope and collapse [R55] Dehydration [E86.0] AMS (altered mental status) [R41.82] Patient Active Problem List   Diagnosis Date Noted   AMS (altered mental status) 07/12/2024   Generalized anxiety disorder with panic attacks 07/27/2023   Caregiver with fatigue 01/14/2017   Hyperlipidemia 04/02/2015   Morbid obesity (HCC) 08/06/2009   Essential hypertension, benign 08/06/2009   Sleep apnea 08/06/2009   Nonspecific  abnormal results of cardiovascular function study 08/06/2009   PCP:  [name] HERO, DO Pharmacy:   [facility] [phone] - [place], [place] - [address] [place] [address] Phone: [phone] Fax: [phone]     Social Drivers of Health (SDOH) Social History: SDOH Screenings   Food Insecurity: No Food Insecurity  (07/12/2024)  Housing: Low Risk  (07/12/2024)  Transportation Needs: No Transportation Needs (07/12/2024)  Utilities: Not At Risk (07/12/2024)  Alcohol Screen: Low Risk  (01/13/2024)  Depression (PHQ2-9): Low Risk  (04/21/2024)  Financial Resource Strain: Low Risk  (01/13/2024)  Physical Activity: Insufficiently Active (01/13/2024)  Social Connections: Moderately Integrated (07/12/2024)  Stress: No Stress Concern Present (01/13/2024)  Tobacco Use: Low Risk  (07/12/2024)  Health Literacy: Adequate Health Literacy (01/13/2024)   SDOH Interventions:     Readmission Risk Interventions     No data to display

## 2024-07-13 NOTE — Progress Notes (Signed)
 [facility] PROGRESS NOTE  Kyran Whittier Nick (DOB: 12/31/1950) FMW:[id number] PCP: [name] HERO, DO  Brief Narrative: Rhonda Daniel is a 73 y.o. female with a history of OSA not on CPAP, HTN, HLD, anxiety, and recent cognitive decline who presented to the ED on 07/12/2024 after being found down at home. EMS noted left hip and right knee bruising. Work up in ED revealed rhabdomyolysis (CK 1,620) with LFT elevations (AST 89, ALT 60), leukocytosis (WBC 13.4k), and pyuria on UA, as well as hypoglycemia (CBG 55). She also had significant urinary retention for which foley was placed with significant difficulty. She was confused. Admission requested. Antibiotics initiated for UTI, IVF for rhabdomyolysis, and PT/OT consulted.   Subjective: Less confused, though her short term recall remains nearly absent. Family at bedside and by speaker phone.   Objective: BP 135/62 (BP Location: Left Arm)   Pulse 67   Temp 97.9 F (36.6 C) (Oral)   Resp 20   Ht 5' 6 (1.676 m)   Wt 117.9 kg   SpO2 96%   BMI 41.97 kg/m   Gen: No distress, older pleasant female Pulm: Clear, nonlabored  CV: RRR, no MRG GI: Soft, NT, ND, +BS  Neuro: Alert and interactive, long term recall intact, total amnesia for the past few days though. Very diffusely weak without asymmetry.  Ext: Warm, no deformities, central obesity, peripheral decreased muscle bulk Skin: R > L lower leg hyperpigmentation/discoloration which is chronic, some varicosities as well. Scattered bruising most prominent on right forearm, left knee, pelvis. Scattered superficial skin tear/abrasions that are covered.   CT abd/pelvis 11/6:  - Large left lateral abdominal wall hernia without incarceration - Distended bladder (foley was clamped) - Diverticulosis, pancreatic calcifications, no inflammatory changes - s/p cholecystectomy with mild intrahepatic ductal dilatation.  - Small hiatal hernia  Assessment & Plan: Acute metabolic encephalopathy on  chronic cognitive impairment: Suspect UTI precipitated confusion and weakness -> fall with chronic progressive deconditioning and what family reports has been a few months of quite progressive short term memory impairment, so was unable to get home or perform sequencing to communicate.  - Recommend outpatient neurology / neuropsychiatric battery after discharge and completed acute treatments.  - CT head with no acute findings, chronic small vessel ischemic disease is noted. Ammonia is technically above ULN at 37, though no asterixis or lethargy, do not suspect hepatic encephalopathy at this time. TSH, B12, RPR, UDS - Continue abx for UTI (+bacteriuria, 6-10 WBCs/HPF  Mild rhabdomyolysis:  - Continue IVF another day   Acute urinary retention:  - Continue foley catheter while treating UTI, will plan to perform voiding trial when urology is available given the difficulty of insertion. Having good UOP, will continue monitoring.   Hypernatremia:  - Change IVF, recheck in AM  Hypoglycemia: - Not on DM medications. Likely due to inadequate oral intake. Will continue monitoring serially to confirm stability.     HTN: - Holding lisinopril  for now and continue with as needed hydralazine   Anxiety: Emotional lability increased while hospitalized and due to amnesia for the event.  - Restart home buspar  7.5mg  TID - Restart SSRI  HLD:  - Continue statin  Skin abrasions, discoloration:  - Keep wounds covered and offload as much as possible.   Morbid obesity: Body mass index is 41.97 kg/m.      Bernardino KATHEE Come, MD [facility] [email or web address] 07/13/2024, 12:06 PM

## 2024-07-13 NOTE — Plan of Care (Signed)
  Problem: Acute Rehab OT Goals (only OT should resolve) Goal: Pt. Will Perform Grooming Flowsheets (Taken 07/13/2024 1044) Pt Will Perform Grooming:  with supervision  standing Goal: Pt. Will Perform Upper Body Dressing Flowsheets (Taken 07/13/2024 1044) Pt Will Perform Upper Body Dressing:  with modified independence  sitting  standing Goal: Pt. Will Perform Lower Body Dressing Flowsheets (Taken 07/13/2024 1044) Pt Will Perform Lower Body Dressing:  with contact guard assist  sitting/lateral leans  sit to/from stand Goal: Pt. Will Transfer To Toilet Flowsheets (Taken 07/13/2024 1044) Pt Will Transfer to Toilet:  with supervision  ambulating  regular height toilet Goal: Pt. Will Perform Toileting-Clothing Manipulation Flowsheets (Taken 07/13/2024 1044) Pt Will Perform Toileting - Clothing Manipulation and hygiene:  with supervision  sitting/lateral leans  sit to/from stand Goal: Pt/Caregiver Will Perform Home Exercise Program Flowsheets (Taken 07/13/2024 1044) Pt/caregiver will Perform Home Exercise Program:  Increased strength  Both right and left upper extremity  Independently  With written HEP provided

## 2024-07-13 NOTE — Plan of Care (Signed)

## 2024-07-13 NOTE — TOC CM/SW Note (Signed)
 30 Day Note:  DOB: 14-Jun-1951  MUST ID: [id number]  To whom it May Concern:  Please be advised that the above name patient will require a short-term nursing home stay- anticipated 30 days or less rehabilitation and strengthening. The plan is for return home.

## 2024-07-13 NOTE — Plan of Care (Signed)
  Problem: Acute Rehab PT Goals(only PT should resolve) Goal: Pt Will Go Supine/Side To Sit Outcome: Progressing Flowsheets (Taken 07/13/2024 1340) Pt will go Supine/Side to Sit:  with moderate assist  with minimal assist Goal: Pt Will Go Sit To Supine/Side Outcome: Progressing Flowsheets (Taken 07/13/2024 1340) Pt will go Sit to Supine/Side:  with moderate assist  with minimal assist Goal: Patient Will Perform Sitting Balance Outcome: Progressing Flowsheets (Taken 07/13/2024 1340) Patient will perform sitting balance:  with moderate assist  with minimal assist Goal: Patient Will Transfer Sit To/From Stand Outcome: Progressing Flowsheets (Taken 07/13/2024 1340) Patient will transfer sit to/from stand:  with moderate assist  with minimal assist Goal: Pt Will Transfer Bed To Chair/Chair To Bed Outcome: Progressing Flowsheets (Taken 07/13/2024 1340) Pt will Transfer Bed to Chair/Chair to Bed:  with mod assist  with min assist Goal: Pt Will Perform Standing Balance Or Pre-Gait Outcome: Progressing Flowsheets (Taken 07/13/2024 1340) Pt will perform standing balance or pre-gait:  with moderate assist  with minimal assist Note: W/ RW Goal: Pt Will Ambulate Outcome: Progressing Flowsheets (Taken 07/13/2024 1340) Pt will Ambulate:  25 feet  with moderate assist  with minimal assist  with rolling walker  Gomer France, SPT

## 2024-07-13 NOTE — Evaluation (Signed)
 Physical Therapy Evaluation Patient Details Name: Rhonda Daniel MRN: [id number] DOB: 02/15/1951 Today's Date: 07/13/2024  History of Present Illness  Rhonda Daniel  is a 73 y.o. female, with past medical history of anxiety, hyperlipidemia, hypertension, apnea, not on CPAP anymore, was brought by EMS she was found on the floor, she is poor historian, so history was obtained from her niece by phone, and ED staff, apparently patient with memory problems, but still lives by herself, she was seen by neurologist 6 months ago where she was diagnosed with mild cognition impairment, family sent a friend to check on the patient as could not hear from her for last day, she was on on the floor by family friend, so EMS were called, patient herself cannot provide much history, she was found to have significant bruising around left hip and right knee area    Clinical Impression  Pt. Presented general weakness and mild swelling of the LE. Pt. Required Mod a/ Max A for bed mobility, transfer from bed to [facility] to chair and ambulation w/ RW. Pt. Initially apprehensive able standing at bedside and side-step ambulation, but became motivated w/ verbal, gesture and tactile cuing. Pt. Was left in chair with family, and nursing staff was notified on pt. Status. Patient will benefit from continued skilled physical therapy in hospital and recommended venue below to increase strength, balance, endurance for safe ADLs and gait.       If plan is discharge home, recommend the following: A lot of help with bathing/dressing/bathroom;A lot of help with walking and/or transfers;Assistance with cooking/housework;Assist for transportation;Help with stairs or ramp for entrance   Can travel by private vehicle   No    Equipment Recommendations None recommended by PT  Recommendations for Other Services       Functional Status Assessment Patient has had a recent decline in their functional status and demonstrates the ability to make  significant improvements in function in a reasonable and predictable amount of time.     Precautions / Restrictions Precautions Precautions: Fall Recall of Precautions/Restrictions: Intact Restrictions Weight Bearing Restrictions Per Provider Order: No      Mobility  Bed Mobility Overal bed mobility: Needs Assistance Bed Mobility: Supine to Sit     Supine to sit: Mod assist, Max assist     General bed mobility comments: slow and labored to EOB with assistance    Transfers Overall transfer level: Needs assistance Equipment used: Rolling walker (2 wheels) Transfers: Sit to/from Stand, Bed to chair/wheelchair/BSC Sit to Stand: Mod assist, Max assist   Step pivot transfers: Mod assist       General transfer comment: Pt used RW for WB    Ambulation/Gait Ambulation/Gait assistance: Mod assist Gait Distance (Feet): 8 Feet Assistive device: Rolling walker (2 wheels) Gait Pattern/deviations: Step-to pattern, Decreased step length - right, Decreased step length - left, Decreased stance time - right, Decreased stance time - left, Decreased stride length       General Gait Details: Pt. was able to side to step to [facility] w/ RW then to chair w/ assistance.  Stairs            Wheelchair Mobility     Tilt Bed    Modified Rankin (Stroke Patients Only)       Balance Overall balance assessment: Needs assistance Sitting-balance support: Feet supported, Bilateral upper extremity supported Sitting balance-Leahy Scale: Fair Sitting balance - Comments: Fair/ Poor to EOB   Standing balance support: During functional activity, Reliant on assistive device  for balance Standing balance-Leahy Scale: Fair Standing balance comment: Fair/ Poor w/ RW                             Pertinent Vitals/Pain Pain Assessment Pain Assessment: No/denies pain    Home Living Family/patient expects to be discharged to:: Private residence Living Arrangements: Alone Available  Help at Discharge: Family;Friend(s);Available PRN/intermittently Type of Home: House Home Access: Stairs to enter Entrance Stairs-Rails: Left Entrance Stairs-Number of Steps: 3   Home Layout: One level Home Equipment: Cane - single point;Grab bars - tub/shower      Prior Function Prior Level of Function : Independent/Modified Independent             Mobility Comments: Used SPC for mobility ADLs Comments: Independent, driving     Extremity/Trunk Assessment   Upper Extremity Assessment Upper Extremity Assessment: Defer to OT evaluation    Lower Extremity Assessment Lower Extremity Assessment: Generalized weakness    Cervical / Trunk Assessment Cervical / Trunk Assessment: Normal  Communication   Communication Communication: No apparent difficulties    Cognition Arousal: Alert Behavior During Therapy: WFL for tasks assessed/performed   PT - Cognitive impairments: No apparent impairments                         Following commands: Intact       Cueing Cueing Techniques: Verbal cues, Tactile cues     General Comments General comments (skin integrity, edema, etc.): Pt had bruising on the RLE, tenderness with palpation    Exercises     Assessment/Plan    PT Assessment Patient needs continued PT services  PT Problem List Decreased strength;Decreased range of motion;Decreased activity tolerance;Decreased balance;Decreased mobility;Decreased coordination       PT Treatment Interventions DME instruction;Gait training;Stair training;Functional mobility training;Therapeutic activities;Therapeutic exercise;Balance training;Patient/family education    PT Goals (Current goals can be found in the Care Plan section)  Acute Rehab PT Goals Patient Stated Goal: Pt. want so to go to rehab to build strength. PT Goal Formulation: With patient/family Time For Goal Achievement: 07/21/24 Potential to Achieve Goals: Good    Frequency Min 3X/week      Co-evaluation PT/OT/SLP Co-Evaluation/Treatment: Yes Reason for Co-Treatment: Complexity of the patient's impairments (multi-system involvement);For patient/therapist safety;To address functional/ADL transfers PT goals addressed during session: Mobility/safety with mobility OT goals addressed during session: ADL's and self-care       AM-PAC PT 6 Clicks Mobility  Outcome Measure Help needed turning from your back to your side while in a flat bed without using bedrails?: A Little Help needed moving from lying on your back to sitting on the side of a flat bed without using bedrails?: A Lot Help needed moving to and from a bed to a chair (including a wheelchair)?: A Lot Help needed standing up from a chair using your arms (e.g., wheelchair or bedside chair)?: A Lot Help needed to walk in hospital room?: A Lot Help needed climbing 3-5 steps with a railing? : A Lot 6 Click Score: 13    End of Session Equipment Utilized During Treatment: Gait belt Activity Tolerance: Patient tolerated treatment well;Patient limited by fatigue Patient left: in chair;with family/visitor present;with call bell/phone within reach Nurse Communication: Mobility status PT Visit Diagnosis: Other abnormalities of gait and mobility (R26.89);Muscle weakness (generalized) (M62.81);History of falling (Z91.81)    Time: 9140-9059 PT Time Calculation (min) (ACUTE ONLY): 41 min   Charges:  PT Evaluation $PT Eval Moderate Complexity: 1 Mod PT Treatments $Therapeutic Activity: 38-52 mins PT General Charges $$ ACUTE PT VISIT: 1 Visit        Storm Dulski, SPT

## 2024-07-13 NOTE — NC FL2 (Signed)
 [place]  MEDICAID FL2 LEVEL OF CARE FORM     IDENTIFICATION  Patient Name: Rhonda Daniel Birthdate: 07-26-1951 Sex: female Admission Date (Current Location): 07/12/2024  [facility] and [place] Number:  [employer] and Address:  [facility],  [address] [place] [address]      Provider Number: [id number]  Attending Physician Name and Address:  Bryn Bernardino NOVAK, MD  Relative Name and Phone Number:  [name] Domino [phone]    Current Level of Care: Hospital Recommended Level of Care: Skilled Nursing Facility Prior Approval Number:    Date Approved/Denied:   PASRR Number:    Discharge Plan: SNF    Current Diagnoses: Patient Active Problem List   Diagnosis Date Noted   AMS (altered mental status) 07/12/2024   Generalized anxiety disorder with panic attacks 07/27/2023   Caregiver with fatigue 01/14/2017   Hyperlipidemia 04/02/2015   Morbid obesity (HCC) 08/06/2009   Essential hypertension, benign 08/06/2009   Sleep apnea 08/06/2009   Nonspecific abnormal results of cardiovascular function study 08/06/2009    Orientation RESPIRATION BLADDER Height & Weight     Self, Place  Normal Indwelling catheter Weight: 260 lb (117.9 kg) Height:  5' 6 (167.6 cm)  BEHAVIORAL SYMPTOMS/MOOD NEUROLOGICAL BOWEL NUTRITION STATUS      Continent Diet (regular)  AMBULATORY STATUS COMMUNICATION OF NEEDS Skin   Extensive Assist Verbally Normal                       Personal Care Assistance Level of Assistance  Bathing, Feeding, Dressing Bathing Assistance: Maximum assistance Feeding assistance: Independent Dressing Assistance: Maximum assistance     Functional Limitations Info  Sight, Hearing, Speech Sight Info: Impaired (eyeglasses) Hearing Info: Adequate Speech Info: Adequate    SPECIAL CARE FACTORS FREQUENCY  PT (By licensed PT), OT (By licensed OT)     PT Frequency: 5 x a week OT Frequency: 5 x a week            Contractures  Contractures Info: Not present    Additional Factors Info  Code Status, Allergies Code Status Info: FULL Allergies Info: Bee Venom           Current Medications (07/13/2024):  This is the current hospital active medication list Current Facility-Administered Medications  Medication Dose Route Frequency Provider Last Rate Last Admin   0.9 %  sodium chloride infusion   Intravenous Continuous Elgergawy, Brayton RAMAN, MD 75 mL/hr at 07/13/24 0614 New Bag at 07/13/24 9385   acetaminophen (TYLENOL) tablet 650 mg  650 mg Oral Q6H PRN Elgergawy, Dawood S, MD       Or   acetaminophen (TYLENOL) suppository 650 mg  650 mg Rectal Q6H PRN Elgergawy, Dawood S, MD       albuterol  (PROVENTIL ) (2.5 MG/3ML) 0.083% nebulizer solution 2.5 mg  2.5 mg Nebulization Q2H PRN Elgergawy, Dawood S, MD       cefTRIAXone (ROCEPHIN) 1 g in sodium chloride 0.9 % 100 mL IVPB  1 g Intravenous Q24H Elgergawy, Dawood S, MD 200 mL/hr at 07/12/24 1758 1 g at 07/12/24 1758   Chlorhexidine Gluconate Cloth 2 % PADS 6 each  6 each Topical Daily Elgergawy, Brayton RAMAN, MD   6 each at 07/13/24 0808   heparin injection 5,000 Units  5,000 Units Subcutaneous Q8H Elgergawy, Dawood S, MD   5,000 Units at 07/13/24 0511   hydrALAZINE (APRESOLINE) injection 10 mg  10 mg Intravenous Q4H PRN Elgergawy, Dawood S, MD  morphine (PF) 2 MG/ML injection 1-2 mg  1-2 mg Intravenous Q4H PRN Elgergawy, Dawood S, MD       polyethylene glycol (MIRALAX / GLYCOLAX) packet 17 g  17 g Oral Daily PRN Elgergawy, Dawood S, MD       senna (SENOKOT) tablet 8.6 mg  1 tablet Oral BID Elgergawy, Dawood S, MD   8.6 mg at 07/13/24 9191   thiamine (VITAMIN B1) tablet 200 mg  200 mg Oral Daily Elgergawy, Dawood S, MD   200 mg at 07/13/24 0807     Discharge Medications: Please see discharge summary for a list of discharge medications.  Relevant Imaging Results:  Relevant Lab Results:   Additional Information SSN: [id number]  Rhonda Daniel, [place]

## 2024-07-13 NOTE — Progress Notes (Signed)
 Mobility Specialist Progress Note:    07/13/24 1425  Mobility  Activity Pivoted/transferred to/from [facility]  Level of Assistance Moderate assist, patient does 50-74%  Assistive Device Front wheel walker  Distance Ambulated (ft) 3 ft  Range of Motion/Exercises Active;All extremities  Activity Response Tolerated well  Mobility Referral Yes  Mobility visit 1 Mobility  Mobility Specialist Start Time (ACUTE ONLY) 1425  Mobility Specialist Stop Time (ACUTE ONLY) 1445  Mobility Specialist Time Calculation (min) (ACUTE ONLY) 20 min   Pt received on BSC, NT requesting assistance transferring back to bed. Required ModA to stand and transfer with RW. Tolerated well, confused. NT in room, all needs met.  Devaris Quirk Mobility Specialist Please contact via Special Educational Needs Teacher or  Rehab office at [phone]

## 2024-07-13 NOTE — Evaluation (Signed)
 Occupational Therapy Evaluation Patient Details Name: Rhonda Daniel MRN: [id number] DOB: 08/26/1951 Today's Date: 07/13/2024   History of Present Illness   Rhonda Daniel  is a 73 y.o. female, with past medical history of anxiety, hyperlipidemia, hypertension, apnea, not on CPAP anymore, was brought by EMS she was found on the floor, she is poor historian, so history was obtained from her niece by phone, and ED staff, apparently patient with memory problems, but still lives by herself, she was seen by neurologist 6 months ago where she was diagnosed with mild cognition impairment, family sent a friend to check on the patient as could not hear from her for last day, she was on on the floor by family friend, so EMS were called, patient herself cannot provide much history, she was found to have significant bruising around left hip and right knee area     Clinical Impressions Pt agreeable to OT/PT co-evaluation this am. Received supine in bed, somewhat emotionally labile during session requiring mod redirection to task. Pt is independent in her ADLs at baseline, uses a SPC for mobility. Pt with weakness and balance deficits impacting ADL performance. Generalized weakness of BUE limiting strength required to assist with mobility tasks. Pt requiring assistance for functional mobility during toileting task. Pt will benefit from skilled OT services to improve safety and independence during ADLs and functional mobility.      If plan is discharge home, recommend the following:   A lot of help with walking and/or transfers;A little help with bathing/dressing/bathroom;Assistance with cooking/housework;Help with stairs or ramp for entrance     Functional Status Assessment   Patient has had a recent decline in their functional status and demonstrates the ability to make significant improvements in function in a reasonable and predictable amount of time.     Equipment Recommendations   None recommended by  OT      Precautions/Restrictions   Precautions Precautions: Fall     Mobility Bed Mobility Overal bed mobility: Needs Assistance             General bed mobility comments: Defer to PT note    Transfers Overall transfer level: Needs assistance                 General transfer comment: Defer to PT note          ADL either performed or assessed with clinical judgement   ADL Overall ADL's : Needs assistance/impaired Eating/Feeding: Set up;Sitting   Grooming: Set up;Sitting Grooming Details (indicate cue type and reason): Unable to stand for grooming tasks Upper Body Bathing: Set up;Sitting Upper Body Bathing Details (indicate cue type and reason): can bathe in sitting position Lower Body Bathing: Moderate assistance;Sitting/lateral leans;Sit to/from stand Lower Body Bathing Details (indicate cue type and reason): Will require assistance for peri-care and legs due to weakness and balance Upper Body Dressing : Set up;Sitting   Lower Body Dressing: Moderate assistance;Sitting/lateral leans;Sit to/from stand Lower Body Dressing Details (indicate cue type and reason): Pt requiring assistance to donn socks and bring over heels. Weakness and balance impacting standing tolerance Toilet Transfer: Moderate assistance;Cueing for safety;Cueing for sequencing;Ambulation;BSC/3in1;Rolling walker (2 wheels) Toilet Transfer Details (indicate cue type and reason): Performed side-stepping from bed to [facility] using RW and mod assist, verbal cuing Toileting- Clothing Manipulation and Hygiene: Moderate assistance;Sitting/lateral lean;Sit to/from stand Toileting - Clothing Manipulation Details (indicate cue type and reason): Assist in standing due to balance     Functional mobility during ADLs: Moderate assistance;Rolling walker (2  wheels);Cueing for safety;Cueing for sequencing       Vision Baseline Vision/History: 1 Wears glasses Vision Assessment?: Wears glasses for reading             Pertinent Vitals/Pain Pain Assessment Pain Assessment: No/denies pain     Extremity/Trunk Assessment Upper Extremity Assessment Upper Extremity Assessment: Generalized weakness   Lower Extremity Assessment Lower Extremity Assessment: Defer to PT evaluation   Cervical / Trunk Assessment Cervical / Trunk Assessment: Normal   Communication Communication Communication: No apparent difficulties   Cognition Arousal: Alert Behavior During Therapy: WFL for tasks assessed/performed (somewhat emotionally labile) Cognition: History of cognitive impairments             OT - Cognition Comments: A&Ox4, repeitive at times                 Following commands: Intact       Cueing  General Comments   Cueing Techniques: Verbal cues              Home Living Family/patient expects to be discharged to:: Private residence Living Arrangements: Alone Available Help at Discharge: Family;Friend(s);Available PRN/intermittently Type of Home: House Home Access: Stairs to enter [employer] of Steps: 3 Entrance Stairs-Rails: Left Home Layout: One level     Bathroom Shower/Tub: Producer, Television/film/video: Standard     Home Equipment: Cane - single point;Grab bars - tub/shower          Prior Functioning/Environment Prior Level of Function : Independent/Modified Independent             Mobility Comments: Used SPC for mobility ADLs Comments: Independent, driving    OT Problem List: Decreased strength;Decreased activity tolerance;Impaired balance (sitting and/or standing);Decreased knowledge of use of DME or AE   OT Treatment/Interventions: Self-care/ADL training;Therapeutic exercise;Neuromuscular education;DME and/or AE instruction;Therapeutic activities;Patient/family education      OT Goals(Current goals can be found in the care plan section)   Acute Rehab OT Goals Patient Stated Goal: To get stronger OT Goal Formulation: With  patient Time For Goal Achievement: 07/27/24 Potential to Achieve Goals: Good   OT Frequency:  Min 1X/week    Co-evaluation PT/OT/SLP Co-Evaluation/Treatment: Yes Reason for Co-Treatment: Complexity of the patient's impairments (multi-system involvement);For patient/therapist safety;To address functional/ADL transfers   OT goals addressed during session: ADL's and self-care      AM-PAC OT 6 Clicks Daily Activity     Outcome Measure Help from another person eating meals?: A Little Help from another person taking care of personal grooming?: A Little Help from another person toileting, which includes using toliet, bedpan, or urinal?: A Lot Help from another person bathing (including washing, rinsing, drying)?: A Lot Help from another person to put on and taking off regular upper body clothing?: A Little Help from another person to put on and taking off regular lower body clothing?: A Lot 6 Click Score: 15   End of Session Equipment Utilized During Treatment: Gait belt;Rolling walker (2 wheels)  Activity Tolerance: Patient tolerated treatment well Patient left: in chair;with call bell/phone within reach;with chair alarm set;with family/visitor present  OT Visit Diagnosis: Muscle weakness (generalized) (M62.81);History of falling (Z91.81)                Time: 9098-9052 OT Time Calculation (min): 46 min Charges:  OT General Charges $OT Visit: 1 Visit OT Evaluation $OT Eval Low Complexity: 1 Low OT Treatments $Self Care/Home Management : 8-22 mins  Sonny Cory, OTR/L  [phone] 07/13/2024, 10:41 AM

## 2024-07-14 DIAGNOSIS — R4182 Altered mental status, unspecified: Secondary | ICD-10-CM | POA: Diagnosis not present

## 2024-07-14 LAB — CBC
HCT: 34.6 % — ABNORMAL LOW (ref 36.0–46.0)
Hemoglobin: 11.1 g/dL — ABNORMAL LOW (ref 12.0–15.0)
MCH: 31 pg (ref 26.0–34.0)
MCHC: 32.1 g/dL (ref 30.0–36.0)
MCV: 96.6 fL (ref 80.0–100.0)
Platelets: 177 K/uL (ref 150–400)
RBC: 3.58 MIL/uL — ABNORMAL LOW (ref 3.87–5.11)
RDW: 13.6 % (ref 11.5–15.5)
WBC: 7.3 K/uL (ref 4.0–10.5)
nRBC: 0 % (ref 0.0–0.2)

## 2024-07-14 LAB — COMPREHENSIVE METABOLIC PANEL WITH GFR
ALT: 41 U/L (ref 0–44)
AST: 42 U/L — ABNORMAL HIGH (ref 15–41)
Albumin: 3 g/dL — ABNORMAL LOW (ref 3.5–5.0)
Alkaline Phosphatase: 55 U/L (ref 38–126)
Anion gap: 6 (ref 5–15)
BUN: 24 mg/dL — ABNORMAL HIGH (ref 8–23)
CO2: 27 mmol/L (ref 22–32)
Calcium: 8.3 mg/dL — ABNORMAL LOW (ref 8.9–10.3)
Chloride: 110 mmol/L (ref 98–111)
Creatinine, Ser: 0.55 mg/dL (ref 0.44–1.00)
GFR, Estimated: >60 mL/min (ref >60)
Glucose, Bld: 84 mg/dL (ref 70–99)
Potassium: 3.5 mmol/L (ref 3.5–5.1)
Sodium: 143 mmol/L (ref 135–145)
Total Bilirubin: 0.5 mg/dL (ref 0.0–1.2)
Total Protein: 5 g/dL — ABNORMAL LOW (ref 6.5–8.1)

## 2024-07-14 LAB — CK: Total CK: 489 U/L — ABNORMAL HIGH (ref 38–234)

## 2024-07-14 LAB — GLUCOSE, CAPILLARY: Glucose-Capillary: 102 mg/dL — ABNORMAL HIGH (ref 70–99)

## 2024-07-14 MED ORDER — MELOXICAM 7.5 MG PO TABS: 7.5000 mg | ORAL_TABLET | Freq: Two times a day (BID) | ORAL | Status: AC | PRN

## 2024-07-14 MED ORDER — CEPHALEXIN 500 MG PO CAPS: 500.0000 mg | ORAL_CAPSULE | Freq: Two times a day (BID) | ORAL | Status: AC

## 2024-07-14 MED ORDER — CEPHALEXIN 500 MG PO CAPS
500.0000 mg | ORAL_CAPSULE | Freq: Two times a day (BID) | ORAL | Status: DC
Start: 2024-07-14 — End: 2024-07-14

## 2024-07-14 NOTE — Progress Notes (Signed)
 Patient to transfer with foley catheter in place as per MD orders due to urinary retention and need for urinary follow up

## 2024-07-14 NOTE — Progress Notes (Signed)
 Mobility Specialist Progress Note:    07/14/24 0925  Mobility  Activity Pivoted/transferred from bed to chair  Level of Assistance Moderate assist, patient does 50-74%  Assistive Device Front wheel walker  Distance Ambulated (ft) 3 ft  Range of Motion/Exercises Active;All extremities  Activity Response Tolerated well  Mobility Referral Yes  Mobility visit 1 Mobility  Mobility Specialist Start Time (ACUTE ONLY) [id number]  Mobility Specialist Stop Time (ACUTE ONLY) 0955  Mobility Specialist Time Calculation (min) (ACUTE ONLY) 30 min   Pt received in bed, agreeable to mobility. Required ModA to stand and transfer with RW. Tolerated well, confused on what is going on. Call bell in reach, all needs met.  Toriana Sponsel Mobility Specialist Please contact via Special Educational Needs Teacher or  Rehab office at [phone]

## 2024-07-14 NOTE — Progress Notes (Signed)
 Report called to [facility] pt to transfer via [facility] to facility room 510 bed 2 . Patient stable and appropriate for transfer. Patient home medications returned to her along with her personal belongings she arrived with.

## 2024-07-14 NOTE — Discharge Summary (Signed)
 Physician Discharge Summary   Patient: Rhonda Daniel MRN: [id number] DOB: 14-Sep-1950  Admit date:     07/12/2024  Discharge date: 07/14/24  Discharge Physician: Bernardino KATHEE Come   PCP: [name] HERO, DO   Recommendations at discharge:  Suggest formal neuropsychiatric evaluation for her mental decline that appears more rapidly progressive.   Discharge Diagnoses: Principal Problem:   AMS (altered mental status) Active Problems:   Morbid obesity (HCC)   Essential hypertension, benign   Hyperlipidemia  Hospital Course: Rhonda Daniel is a 73 y.o. female with a history of OSA not on CPAP, HTN, HLD, anxiety, and recent cognitive decline who presented to the ED on 07/12/2024 after being found down at home. EMS noted diffuse bruising, though it is unclear the circumstances that surrounded the fall as the patient was amnestic for the event. Work up in ED revealed rhabdomyolysis (CK 1,620) with LFT elevations (AST 89, ALT 60), leukocytosis (WBC 13.4k), and pyuria on UA, as well as hypoglycemia (CBG 55). She also had urinary retention for which foley was placed with significant difficulty. She was confused. Admission requested. Antibiotics initiated for UTI, IVF for rhabdomyolysis, and PT/OT consulted. With IVF and antibiotics, her mental status has cleared remarkably back to her baseline. Labs all also look improved and she's taking adequate po. She is quite weak and will require acute rehabilitation after discharge which is pursued.   Assessment and Plan: Acute metabolic encephalopathy on chronic cognitive impairment: Suspect UTI precipitated confusion and weakness -> fall with chronic progressive deconditioning and what family reports has been a few months of quite progressive short term memory impairment, so was unable to get home or perform sequencing to communicate.  - Recommend outpatient neurology / neuropsychiatric battery after discharge and completed acute treatments.  - CT head with no acute  findings, chronic small vessel ischemic disease is noted. Ammonia is technically above ULN at 37, though no asterixis or lethargy, do not suspect hepatic encephalopathy at this time. TSH, B12, RPR, UDS - Continue abx for UTI (+bacteriuria, 6-10 WBCs/HPF), complete course with keflex , d/w pharmacy.   Mild rhabdomyolysis: Resolving.   Acute urinary retention:  - Continue foley catheter while treating UTI, will plan to perform voiding trial at SNF or urology clinic given the difficulty of insertion. Having good UOP, will continue monitoring.    Hypernatremia:  - Resolved.   Hypoglycemia: - Not on DM medications. Likely due to inadequate oral intake. Stable on recheck without interventions. BP not low.     HTN: - Continue home Tx   Anxiety: Emotional lability increased while hospitalized and due to amnesia for the event.  - Restart home buspar  7.5mg  TID - Restart SSRI   HLD:  - Continue statin now that LFTs improved   Skin abrasions, discoloration:  - Keep wounds covered and offload as much as possible.    Morbid obesity: Body mass index is 41.97 kg/m.   Consultants: None Procedures performed: None  Disposition: Skilled nursing facility Diet recommendation: Heart healthy DISCHARGE MEDICATION: Allergies as of 07/14/2024       Reactions   Bee Venom Swelling, Other (See Comments)   Local redness         Medication List     TAKE these medications    aspirin EC 81 MG tablet Take 81 mg by mouth daily. Swallow whole.   busPIRone  5 MG tablet Commonly known as: BUSPAR  Take 1 tablet (5 mg total) by mouth 3 (three) times daily. For anxiety/ panic What changed:  how much to take   cephALEXin  500 MG capsule Commonly known as: KEFLEX  Take 1 capsule (500 mg total) by mouth every 12 (twelve) hours for 4 days.   levocetirizine 5 MG tablet Commonly known as: XYZAL  Take 1 tablet (5 mg total) by mouth every evening.   lisinopril  20 MG tablet Commonly known as: ZESTRIL  Take 1  tablet (20 mg total) by mouth daily.   meloxicam 7.5 MG tablet Commonly known as: MOBIC Take 1 tablet (7.5 mg total) by mouth 2 (two) times daily as needed for pain. What changed:  when to take this reasons to take this   sertraline  50 MG tablet Commonly known as: ZOLOFT  Take 1 tablet (50 mg total) by mouth daily.   simvastatin  20 MG tablet Commonly known as: ZOCOR  TAKE ONE TABLET DAILY AT 6PM   Vitamin D  (Ergocalciferol ) 1.25 MG (50000 UNIT) Caps capsule Commonly known as: DRISDOL  TAKE 1 CAPSULE (50,000 UNITS TOTAL) BY MOUTH EVERY 7 (SEVEN) DAYS   Vitamin D3 50 MCG (2000 UT) capsule Take 2,000 Units by mouth daily.        Contact information for follow-up providers     Jolinda Potter M, DO Follow up.   Specialty: Family Medicine Contact information: [address] [place] [address] [phone]         Sherrilee Belvie CROME, MD. Call.   Specialty: Urology Why: Schedule appointment if fails voiding trial at [facility] Contact information: [address] [place] [address] [phone]              Contact information for after-discharge care     Destination     [facility] .   Service: Skilled Nursing Contact information: [address] [place]  [address] [phone]                    Discharge Exam: Fredricka Weights   07/12/24 1202  Weight: 117.9 kg  Well-appearing obese elderly female in no distress Remembers me, says she was confused about where she was when she first woke up but is completely clear on situation now, no focal deficits but diffusely very weak.   Condition at discharge: stable  The results of significant diagnostics from this hospitalization (including imaging, microbiology, ancillary and laboratory) are listed below for reference.   Imaging Studies: CT ABDOMEN PELVIS W CONTRAST Result Date: 07/13/2024 EXAM: CT ABDOMEN AND PELVIS WITH CONTRAST 07/12/2024 11:56:19 PM TECHNIQUE:  CT of the abdomen and pelvis was performed with the administration of 100 mL of iohexol (OMNIPAQUE) 300 mg/mL solution. Multiplanar reformatted images are provided for review. Automated exposure control, iterative reconstruction, and/or weight-based adjustment of the mA/kV was utilized to reduce the radiation dose to as low as reasonably achievable. COMPARISON: None available. CLINICAL HISTORY: abd pain, suprapubic area FINDINGS: LOWER CHEST: There is a small hiatal hernia. LIVER: There is mild intrahepatic biliary ductal dilatation. No focal liver lesion. GALLBLADDER AND BILE DUCTS: Gallbladder is surgically absent. Common bile duct is normal in size. SPLEEN: No acute abnormality. PANCREAS: There are calcifications in the pancreatic head. No acute inflammation. ADRENAL GLANDS: No acute abnormality. KIDNEYS, URETERS AND BLADDER: There is a cyst in the left kidney measuring 16 mm. Per consensus, no follow-up is needed for simple Bosniak type 1 and 2 renal cysts, unless the patient has a malignancy history or risk factors. Foley catheter is in the bladder. The bladder is significantly distended despite foley catheter placement. No stones in the kidneys or ureters.  No hydronephrosis. No perinephric or periureteral stranding. GI AND BOWEL: Stomach demonstrates no acute abnormality. There is a large amount of stool in the rectum. There is colonic diverticulosis. The appendix is not visualized. There is no bowel obstruction. PERITONEUM AND RETROPERITONEUM: There is some presacral edema. No ascites. No free air. VASCULATURE: Aorta is normal in caliber. LYMPH NODES: No lymphadenopathy. REPRODUCTIVE ORGANS: No acute abnormality. BONES AND SOFT TISSUES: Multilevel degenerative changes affect the spine. Left lateral abdominal wall hernia is present containing nondilated colon. This hernia is large in size. There is anterior abdominal wall hernia repair with mesh. No acute osseous abnormality. No focal soft tissue abnormality.  IMPRESSION: 1. Large left lateral abdominal wall hernia containing nondilated colon. 2. Significantly distended bladder despite Foley catheter placement. Consider catheter dysfunction or obstruction. 3. Small hiatal hernia. 4. Colonic diverticulosis without evidence of diverticulitis. 5. Mild intrahepatic biliary ductal dilatation status post cholecystectomy. Correlate with lab values . 6. Calcifications in the pancreatic head compatible with chronic pancreatitis. No acute inflammation. Electronically signed by: Greig Pique MD 07/13/2024 12:12 AM EST RP Workstation: [id number]   DG Knee Right Port Result Date: 07/12/2024 EXAM: 1 or 2 VIEW(S) XRAY OF THE RIGHT KNEE 07/12/2024 10:27:00 PM COMPARISON: Right knee x-ray dated 09/22/2019. CLINICAL HISTORY: Pain. FINDINGS: BONES AND JOINTS: No acute fracture. No focal osseous lesion. No joint dislocation. Small joint effusion present. Tricompartmental degenerative changes of the knee most significant in the Patellofemoral and lateral compartment with joint space narrowing and osteophyte formation. Findings are mildly progressed compared to the prior study. SOFT TISSUES: The soft tissues are unremarkable. IMPRESSION: 1. Tricompartmental degenerative changes of the right knee, mildly progressed. 2. Small joint effusion. Electronically signed by: Greig Pique MD 07/12/2024 10:34 PM EST RP Workstation: [id number]   DG Chest Port 1 View Result Date: 07/12/2024 EXAM: 1 VIEW(S) XRAY OF THE CHEST 07/12/2024 12:40:00 PM COMPARISON: None available. CLINICAL HISTORY: LH? FINDINGS: LUNGS AND PLEURA: No focal pulmonary opacity. No pulmonary edema. No pleural effusion. No pneumothorax. HEART AND MEDIASTINUM: Calcification of transverse aorta. BONES AND SOFT TISSUES: Thoracic spondylosis. Degenerative acromioclavicular joint arthropathy on the right. IMPRESSION: 1. No acute cardiopulmonary process. 2. Thoracic spondylosis. 3. Degenerative right acromioclavicular joint arthropathy.  4. Aortic atherosclerosis. Electronically signed by: Gayanne Prescott Salvage MD 07/12/2024 01:27 PM EST RP Workstation: [id number]   CT Head Wo Contrast Result Date: 07/12/2024 EXAM: CT HEAD WITHOUT CONTRAST 07/12/2024 01:13:00 PM TECHNIQUE: CT of the head was performed without the administration of intravenous contrast. Automated exposure control, iterative reconstruction, and/or weight based adjustment of the mA/kV was utilized to reduce the radiation dose to as low as reasonably achievable. COMPARISON: None available. CLINICAL HISTORY: Memory loss; Mental status change, unknown cause. FINDINGS: BRAIN AND VENTRICLES: No acute hemorrhage. No evidence of acute infarct. No hydrocephalus. No extra-axial collection. No mass effect or midline shift. ORBITS: No acute abnormality. SINUSES: No acute abnormality. SOFT TISSUES AND SKULL: No acute soft tissue abnormality. No skull fracture. IMPRESSION: 1. No acute intracranial abnormality. Electronically signed by: Franky Stanford MD 07/12/2024 01:16 PM EST RP Workstation: [id number]    Microbiology: No results found for this or any previous visit.  Labs: CBC: Recent Labs  Lab 07/12/24 1302 07/13/24 0335 07/14/24 0406  WBC 13.4* 8.2 7.3  NEUTROABS 11.7*  --   --   HGB 13.5 10.8* 11.1*  HCT 41.6 33.4* 34.6*  MCV 96.1 97.1 96.6  PLT 188 163 177   Basic Metabolic Panel: Recent Labs  Lab 07/12/24 1302 07/13/24 0335 07/14/24 0406  NA 143 146* 143  K 4.2 3.5 3.5  CL 106 113* 110  CO2 25 25 27   GLUCOSE 103* 83 84  BUN 61* 41* 24*  CREATININE 0.90 0.54 0.55  CALCIUM 9.6 8.5* 8.3*   Liver Function Tests: Recent Labs  Lab 07/12/24 1302 07/13/24 0335 07/14/24 0406  AST 89* 56* 42*  ALT 60* 45* 41  ALKPHOS 72 61 55  BILITOT 1.1 0.8 0.5  PROT 7.0 5.1* 5.0*  ALBUMIN 4.0 3.0* 3.0*   CBG: Recent Labs  Lab 07/12/24 1524 07/12/24 1602 07/13/24 1559 07/13/24 2352 07/14/24 0756  GLUCAP 55* 143* 165* 94 102*    Discharge time spent: greater  than 30 minutes.  Signed: Bernardino KATHEE Come, MD [facility] 07/14/2024

## 2024-07-14 NOTE — TOC Transition Note (Signed)
 Transition of Care [facility]) - Discharge Note   Patient Details  Name: Rhonda Daniel MRN: [id number] Date of Birth: [birth year]  [facility] CM/SW Contact:  Noreen KATHEE Pinal, LCSWA Phone Number: 07/14/2024, 12:13 PM   Clinical Narrative:     CSW spoke with patient and family at bedside about DC today to [facility] rehab. Family was updated and given room number. Nurse was also provided with room and number for report. [facility] will be here at 12:45pm for pick up at main entrance. CSW signing off.  Final next level of care: Skilled Nursing Facility Barriers to Discharge: Barriers Resolved   Patient Goals and CMS Choice Patient states their goals for this hospitalization and ongoing recovery are:: short-term rehab CMS Medicare.gov Compare Post Acute Care list provided to:: Patient Choice offered to / list presented to : Patient      Discharge Placement              Patient chooses bed at:  [facility] rehab) Patient to be transferred to facility by: [facility] Name of family member notified: Breother significant other at bedside with patient Patient and family notified of of transfer: 07/14/24  Discharge Plan and Services Additional resources added to the After Visit Summary for       Post Acute Care Choice: Durable Medical Equipment                               Social Drivers of Health (SDOH) Interventions SDOH Screenings   Food Insecurity: No Food Insecurity (07/12/2024)  Housing: Low Risk  (07/12/2024)  Transportation Needs: No Transportation Needs (07/12/2024)  Utilities: Not At Risk (07/12/2024)  Alcohol Screen: Low Risk  (01/13/2024)  Depression (PHQ2-9): Low Risk  (04/21/2024)  Financial Resource Strain: Low Risk  (01/13/2024)  Physical Activity: Insufficiently Active (01/13/2024)  Social Connections: Moderately Integrated (07/12/2024)  Stress: No Stress Concern Present (01/13/2024)  Tobacco Use: Low Risk  (07/12/2024)  Health Literacy: Adequate Health Literacy (01/13/2024)      Readmission Risk Interventions     No data to display

## 2024-07-14 NOTE — Care Management Obs Status (Signed)
 MEDICARE OBSERVATION STATUS NOTIFICATION   Patient Details  Name: Rhonda Daniel MRN: [id number] Date of Birth: [birth year]   Medicare Observation Status Notification Given:       Duwaine LITTIE Ada 07/14/2024, 11:46 AM

## 2024-07-19 LAB — LAB REPORT - SCANNED

## 2024-07-21 ENCOUNTER — Encounter: Admitting: Family Medicine

## 2024-07-26 ENCOUNTER — Ambulatory Visit: Payer: Self-pay

## 2024-08-02 ENCOUNTER — Telehealth: Payer: Self-pay

## 2024-08-02 NOTE — Transitions of Care (Post Inpatient/ED Visit) (Signed)
   08/02/2024  Name: Rhonda Daniel MRN: [id number] DOB: [birth year]  Today's TOC FU Call Status: Today's TOC FU Call Status:: Unsuccessful Call (1st Attempt) Unsuccessful Call (1st Attempt) Date: 08/02/24  Attempted to reach the patient regarding the most recent Inpatient/ED visit.  Follow Up Plan: Additional outreach attempts will be made to reach the patient to complete the Transitions of Care (Post Inpatient/ED visit) call.   Signature Julian Lemmings, LPN [facility] Nurse Health Advisor Direct Dial [phone]

## 2024-08-07 ENCOUNTER — Telehealth: Payer: Self-pay

## 2024-08-07 NOTE — Telephone Encounter (Signed)
 Noted

## 2024-08-07 NOTE — Telephone Encounter (Signed)
 Copied from CRM #[id number]. Topic: General - Other >> Aug 07, 2024 11:18 AM Susanna ORN wrote: Reason for CRM: Dorothe, with [facility], called to check if patient was active with Dr. Jolinda. Informed her that patient is active. She states they received a referral and she wanted to let Dr. Jolinda know that [facility] is following patient for her home health needs and will call if needed.

## 2024-08-08 NOTE — Transitions of Care (Post Inpatient/ED Visit) (Signed)
 08/08/2024  Name: Rhonda Daniel MRN: [id number] DOB: 15-Feb-1951  Today's TOC FU Call Status: Today's TOC FU Call Status:: Successful TOC FU Call Completed Unsuccessful Call (1st Attempt) Date: 08/02/24 [facility] FU Call Complete Date: 08/08/24  Patient's Name and Date of Birth confirmed. Name, DOB  Transition Care Management Follow-up Telephone Call Date of Discharge: 08/01/24 Discharge Facility: Other [facility]) Name of Other ([facility]) Discharge Facility: [facility] Type of Discharge: Inpatient Admission Primary Inpatient Discharge Diagnosis:: anxiety How have you been since you were released from the hospital?: Better Any questions or concerns?: No  Items Reviewed: Did you receive and understand the discharge instructions provided?: Yes Medications obtained,verified, and reconciled?: Yes (Medications Reviewed) Any new allergies since your discharge?: No Dietary orders reviewed?: Yes Do you have support at home?: Yes People in Home [RPT]: friend(s)  Medications Reviewed Today: Medications Reviewed Today     Reviewed by Emmitt Pan, LPN (Licensed Practical Nurse) on 08/08/24 at 1355  Med List Status: <None>   Medication Order Taking? Sig Documenting Provider Last Dose Status Informant  aspirin EC 81 MG tablet [id number] Yes Take 81 mg by mouth daily. Swallow whole. [provider]  Active Pharmacy Records, Friend, Other, Multiple Informants  busPIRone  (BUSPAR ) 5 MG tablet [id number] Yes Take 1 tablet (5 mg total) by mouth 3 (three) times daily. For anxiety/ panic [name] HERO, DO  Active Pharmacy Records, Friend, Other, Multiple Informants  Cholecalciferol (VITAMIN D3) 50 MCG (2000 UT) capsule [id number]  Take 2,000 Units by mouth daily.  Patient not taking: Reported on 08/08/2024   [provider]  Active Pharmacy Records, Friend, Other, Multiple Informants  levocetirizine (XYZAL ) 5 MG tablet [id number] Yes Take 1 tablet (5 mg total) by mouth every  evening. [name] HERO, DO  Active Pharmacy Records, Friend, Other, Multiple Informants  lisinopril  (ZESTRIL ) 20 MG tablet [id number] Yes Take 1 tablet (20 mg total) by mouth daily. [name] HERO, DO  Active Pharmacy Records, Friend, Other, Multiple Informants  meloxicam  (MOBIC ) 7.5 MG tablet [id number] Yes Take 1 tablet (7.5 mg total) by mouth 2 (two) times daily as needed for pain. Bryn Bernardino NOVAK, MD  Active   sertraline  (ZOLOFT ) 50 MG tablet [id number] Yes Take 1 tablet (50 mg total) by mouth daily. [name] HERO, DO  Active Pharmacy Records, Friend, Other, Multiple Informants  simvastatin  (ZOCOR ) 20 MG tablet [id number] Yes TAKE ONE TABLET DAILY AT JEREL [name] HERO, DO  Active Pharmacy Records, Friend, Other, Multiple Informants  Vitamin D , Ergocalciferol , (DRISDOL ) 1.25 MG (50000 UNIT) CAPS capsule [id number]  TAKE 1 CAPSULE (50,000 UNITS TOTAL) BY MOUTH EVERY 7 (SEVEN) DAYS  Patient not taking: Reported on 08/08/2024   [name] HERO, DO  Active Pharmacy Records, Friend, Other, Multiple Informants           Med Note DARILYN, DAWN S   Thu Jul 13, 2024 11:45 AM) Need refill            Home Care and Equipment/Supplies: Were Home Health Services Ordered?: NA Any new equipment or medical supplies ordered?: NA  Functional Questionnaire: Do you need assistance with bathing/showering or dressing?: Yes Do you need assistance with meal preparation?: Yes Do you need assistance with eating?: No Do you have difficulty maintaining continence: No Do you need assistance with getting out of bed/getting out of a chair/moving?: No Do you have difficulty managing or taking your medications?: No  Follow up appointments reviewed: PCP Follow-up appointment confirmed?: Yes Date of PCP follow-up appointment?:  08/10/24 Follow-up Provider: [facility] Follow-up appointment confirmed?: Yes Date of Specialist follow-up appointment?: 08/29/24 Follow-Up Specialty  Provider:: ortho Do you need transportation to your follow-up appointment?: No Do you understand care options if your condition(s) worsen?: Yes-patient verbalized understanding    SIGNATURE Julian Lemmings, LPN [facility] Nurse Health Advisor Direct Dial [phone]

## 2024-08-10 ENCOUNTER — Ambulatory Visit: Admitting: Nurse Practitioner

## 2024-08-10 ENCOUNTER — Encounter: Payer: Self-pay | Admitting: Nurse Practitioner

## 2024-08-10 VITALS — BP 119/73 | HR 89 | Temp 98.1°F | Ht 66.0 in | Wt 236.0 lb

## 2024-08-10 DIAGNOSIS — Z23 Encounter for immunization: Secondary | ICD-10-CM

## 2024-08-10 DIAGNOSIS — L89322 Pressure ulcer of left buttock, stage 2: Secondary | ICD-10-CM

## 2024-08-10 DIAGNOSIS — R4182 Altered mental status, unspecified: Secondary | ICD-10-CM

## 2024-08-10 DIAGNOSIS — Z789 Other specified health status: Secondary | ICD-10-CM

## 2024-08-10 DIAGNOSIS — N390 Urinary tract infection, site not specified: Secondary | ICD-10-CM | POA: Diagnosis not present

## 2024-08-10 MED ORDER — CEPHALEXIN 500 MG PO CAPS: 500.0000 mg | ORAL_CAPSULE | Freq: Three times a day (TID) | ORAL | 0 refills | Status: DC

## 2024-08-10 NOTE — Addendum Note (Signed)
 Addended by: Eriverto Byrnes, MARY-MARGARET on: 08/10/2024 02:28 PM   Modules accepted: Orders

## 2024-08-10 NOTE — Progress Notes (Addendum)
 Her independence is increasing. She is home by herself   Subjective:    Patient ID: Rhonda Daniel, female    DOB: 01/15/1951, 73 y.o.   MRN: [id number]   Chief Complaint: hospital follow up  HPI  Today's visit was for Transitional Care Management.  The patient was discharged from [facility] facility on 07/14/24 with a primary diagnosis of anxiety.   Contact with the patient and/or caregiver, by a clinical staff member, was made on 08/02/24 and was documented as a telephone encounter within the EMR.  Through chart review and discussion with the patient I have determined that management of their condition is of moderate complexity.   Patient was in ED on 07/12/24 with altered mental status. She was found in floor at home and EMS was called. She was admitted. They determined that UTI possibly caused altered mental status and syncopial episode. She was admitted for 2 days and discharge home. She was discharged to rehab in [place]. Stayed there until 08/02/24. She is now home. Since going home she has increasing independence. She is home alone but lots of visits from friends and family.  - short term memory is worsening - sore on bottom- cleaning daily with aquafor- is some better.    Patient Active Problem List   Diagnosis Date Noted   AMS (altered mental status) 07/12/2024   Generalized anxiety disorder with panic attacks 07/27/2023   Caregiver with fatigue 01/14/2017   Hyperlipidemia 04/02/2015   Morbid obesity (HCC) 08/06/2009   Essential hypertension, benign 08/06/2009   Sleep apnea 08/06/2009   Nonspecific abnormal results of cardiovascular function study 08/06/2009       Review of Systems     Objective:   Physical Exam Constitutional:      Appearance: Normal appearance.  Cardiovascular:     Rate and Rhythm: Normal rate and regular rhythm.     Heart sounds: Normal heart sounds.  Pulmonary:     Breath sounds: Normal breath sounds.  Skin:    General: Skin is warm.      Comments: 3cm open wound on left buttocks  Neurological:     General: No focal deficit present.     Mental Status: She is alert and oriented to person, place, and time.     BP 119/73   Pulse 89   Temp 98.1 F (36.7 C) (Temporal)   Ht 5' 6 (1.676 m)   Wt 236 lb (107 kg)   SpO2 90%   BMI 38.09 kg/m        Assessment & Plan:   Rhonda Daniel in today with chief complaint of transition of Care  1. Transition of care (Primary) Hospital records reviewed  2. Pressure injury of left buttock, stage 2 (HCC) Referral to would care Keflex  as prescribed  3. Frequent UTI Proper hygiene discussed Ok to wear depends as needed  4. Memory issues Orient daily  Meds ordered this encounter  Medications   cephALEXin  (KEFLEX ) 500 MG capsule    Sig: Take 1 capsule (500 mg total) by mouth 3 (three) times daily.    Dispense:  30 capsule    Refill:  0    Supervising Provider:   MARYANNE CHEW A [[id number]]     The above assessment and management plan was discussed with the patient. The patient verbalized understanding of and has agreed to the management plan. Patient is aware to call the clinic if symptoms persist or worsen. Patient is aware when to return to the clinic for  a follow-up visit. Patient educated on when it is appropriate to go to the emergency department.   Mary-Margaret Gladis, FNP

## 2024-08-10 NOTE — Addendum Note (Signed)
 Addended by: GLADIS MUSTARD on: 08/10/2024 02:31 PM   Modules accepted: Level of Service

## 2024-08-10 NOTE — Patient Instructions (Signed)

## 2024-08-11 ENCOUNTER — Telehealth: Payer: Self-pay | Admitting: Family Medicine

## 2024-08-11 NOTE — Telephone Encounter (Signed)
 Copied from CRM [id number]. Topic: Clinical - Home Health Verbal Orders >> Aug 11, 2024  3:42 PM Emylou G wrote: Caller/Agency: [place] [facility] Callback Number: [phone] secured line Service Requested: Physical Therapy Frequency: 1w9 and speech therapy eval Any new concerns about the patient? No

## 2024-08-11 NOTE — Telephone Encounter (Signed)
 Contacted Chris with [facility]. Verbal orders given for PT 1week9 and speech thjerapy eval

## 2024-08-14 ENCOUNTER — Telehealth: Payer: Self-pay | Admitting: Family Medicine

## 2024-08-14 NOTE — Telephone Encounter (Signed)
 Returned call, verbal ok given.

## 2024-08-14 NOTE — Telephone Encounter (Unsigned)
 Copied from CRM [id number]. Topic: Clinical - Home Health Verbal Orders >> Aug 14, 2024 11:32 AM Wess RAMAN wrote: Caller/Agency: [place]/ [facility] Callback Number: [phone] Service Requested: Occupational Therapy Frequency: 1 week 3 Any new concerns about the patient? No

## 2024-08-21 ENCOUNTER — Ambulatory Visit (INDEPENDENT_AMBULATORY_CARE_PROVIDER_SITE_OTHER): Admitting: Nurse Practitioner

## 2024-08-21 ENCOUNTER — Encounter: Payer: Self-pay | Admitting: Nurse Practitioner

## 2024-08-21 VITALS — BP 134/72 | HR 67 | Temp 97.8°F | Ht 66.0 in | Wt 228.0 lb

## 2024-08-21 DIAGNOSIS — L89322 Pressure ulcer of left buttock, stage 2: Secondary | ICD-10-CM | POA: Diagnosis not present

## 2024-08-21 NOTE — Patient Instructions (Signed)
 Wound Packing Wound packing usually involves placing a moistened packing material into your wound and then covering it with an outer bandage (dressing). This helps support the healing of deep tissue and tissue under the skin. It also helps prevent bleeding, infection, and further injury. Wounds are packed until deep tissues heal. The time it takes for this to happen is different for everyone. Your health care provider will show you how to pack and dress your wound. Using gloves and a clean technique is important to avoid spreading germs into your wound. Supplies needed: Soap and water. Disposable gloves. Cleansing or wetting solution, such as saline, germ-free (sterile) water, or an antiseptic solution. Clean bowl. Clean packing material, such as gauze, gauze sponges, or rolled gauze. Clean paper towels. Outer dressing. This includes the cover dressing and tape, or a dressing with an adhesive border. Cotton-tipped swabs. Small plastic bag for trash. How to pack your wound Follow your health care provider's instructions on how often you need to change dressings and pack your wound. You will likely be asked to change your dressings 1 to 2 times a day. Preparing to change the wound packing If needed, take pain medicine 30 minutes before you pack your wound as told by your health care provider. Preparing the new packing material  Clean and disinfect your work surface or countertop. Set a plastic bag on or near your work surface. Wash your hands with soap and water for at least 20 seconds before you change the dressing. If soap and water are not available, use hand sanitizer. Put a clean paper towel on the counter. Put a clean bowl on the towel. Only touch the outside of the bowl when handling it. Pour the cleansing or wetting solution that your health care provider tells you to use into the bowl. Select and cut your packing material to fit the size of your wound. Avoid using multiple pieces of  packing material. Drop it into the bowl. Cut tape strips that you will use to seal the outer dressing, if needed. Put gauze pads for cleansing and cotton-tipped swabs on the clean paper towel. Removing the old packing material and dressing Put on a set of gloves. Gently remove the old dressing and packing material. Make sure to check how the drainage looks or if there is any odor. Clean or rinse (irrigate) the wound. Remove your gloves. Put the removed items, including gloves, into the plastic bag to throw away later. Wash your hands again with soap and water for at least 20 seconds. If soap and water are not available, use hand sanitizer. Applying the new packing material and dressing  Put on a new set of gloves. Squeeze the packing material in the bowl to release the extra liquid. The packing material should be moist, but not dripping wet. Gently place the packing material into the wound. Use a cotton-tipped swab to guide it into place, filling all of the space. Do not overpack the wound bed. Dry your gloved fingertips on the paper towel. Open up your outer dressing supplies and put them on a dry part of the paper towel. Keep them from getting wet. Place the outer dressing over the packed wound. Tape the edges of the outer dressing in place. Remove your gloves. Wash your hands again with soap and water for at least 20 seconds. If soap and water are not available, use hand sanitizer. Put the removed items, including gloves, into the plastic bag to throw away. Clean and disinfect your work  surface or countertop. General tips Follow your health care provider's instructions on how much to pack the wound. At first, you may need to pack it more fully to help stop bleeding. As the wound begins to heal inside, you will use less packing material and pack the wound loosely to allow the tissue to heal slowly from the inside out. Do not take baths, swim, or use a hot tub until your health care  provider approves. Ask your health care provider if you may take showers. You may only be allowed to take sponge baths. Keep the dressing clean and dry. Follow any other instructions given by your health care provider on how to aid healing. This may include applying warm or cold compresses, raising (elevating) the affected area, or wearing a compression dressing. Check your wound site every day for signs of infection. Check for: More redness, swelling, or pain. More fluid or blood. Warmth or hardness (induration). Pus or a bad smell. Protect your wound from the sun when you are outside for the first 6 months, or for as long as told by your health care provider. Cover up the scar area or apply sunscreen that has an SPF of at least 30. Keep all follow-up visits. This is important. Contact a health care provider if: Your pain is not controlled with pain medicine. You have more drainage, redness, swelling, or pain at your wound site. You have new rash, warmth, or induration around the wound. You have a fever or chills. Your wound becomes larger or deeper. Get help right away if: The tissue inside your wound changes color from pink to white, yellow, or black. You notice a bad smell or pus coming from the wound site. You are having trouble packing your wound. Your wound is bleeding, and the bleeding does not stop with gentle pressure. These symptoms may represent a serious problem that is an emergency. Do not wait to see if the symptoms will go away. Get medical help right away. Call your local emergency services (911 in the [place] Do not drive yourself to the hospital. Summary Wound packing usually involves placing a moistened packing material into your wound and then covering it with an outer bandage (dressing). Follow your health care provider's instructions on how often you need to change dressings and pack your wound. You will likely be asked to change dressings 1 to 2 times a day. When  packing your wound, it is important to use gloves to avoid spreading germs into the wound. Check your wound site every day for signs of infection. This information is not intended to replace advice given to you by your health care provider. Make sure you discuss any questions you have with your health care provider. Document Revised: 12/31/2020 Document Reviewed: 12/31/2020 Elsevier Patient Education  2024 [employer].

## 2024-08-21 NOTE — Progress Notes (Signed)
° °  Subjective:    Patient ID: Rhonda Daniel, female    DOB: February 20, 1951, 73 y.o.   MRN: [id number]   Chief Complaint: decubitus ulcer recheck  HPI  Patient was seen for hospital follow up on 08/10/24. She had decubiti on left buttocks. She was given keflex  and was referred to wound care. Cannot get in with wound care for another week, so she is back today for wound care. Her family says wound is still draining.  Patient Active Problem List   Diagnosis Date Noted   AMS (altered mental status) 07/12/2024   Generalized anxiety disorder with panic attacks 07/27/2023   Caregiver with fatigue 01/14/2017   Hyperlipidemia 04/02/2015   Morbid obesity (HCC) 08/06/2009   Essential hypertension, benign 08/06/2009   Sleep apnea 08/06/2009   Nonspecific abnormal results of cardiovascular function study 08/06/2009       Review of Systems  Constitutional:  Negative for diaphoresis.  Eyes:  Negative for pain.  Respiratory:  Negative for shortness of breath.   Cardiovascular:  Negative for chest pain, palpitations and leg swelling.  Gastrointestinal:  Negative for abdominal pain.  Endocrine: Negative for polydipsia.  Skin:  Negative for rash.  Neurological:  Negative for dizziness, weakness and headaches.  Hematological:  Does not bruise/bleed easily.  All other systems reviewed and are negative.      Objective:   Physical Exam Constitutional:      Appearance: Normal appearance.  Cardiovascular:     Rate and Rhythm: Normal rate and regular rhythm.     Heart sounds: Normal heart sounds.  Pulmonary:     Effort: Pulmonary effort is normal.     Breath sounds: Normal breath sounds.  Skin:    General: Skin is warm.  Neurological:     General: No focal deficit present.     Mental Status: She is alert and oriented to person, place, and time.  Psychiatric:        Mood and Affect: Mood normal.        Behavior: Behavior normal.    BP 134/72   Pulse 67   Temp 97.8 F (36.6 C) (Temporal)    Ht 5' 6 (1.676 m)   Wt 228 lb (103.4 kg)   SpO2 96%   BMI 36.80 kg/m    Decubitus ulcer left buttocks- 2cm opening with possible 5-6 cm internally- packed with iodoform packing-     Assessment & Plan:  Rhonda E Yorio in today with chief complaint of No chief complaint on file.   1. Pressure injury of left buttock, stage 2 (HCC) (Primary) Wound care discussed with family Order home health Sit  on donut Keep area clean and dry - Ambulatory referral to Home Health    The above assessment and management plan was discussed with the patient. The patient verbalized understanding of and has agreed to the management plan. Patient is aware to call the clinic if symptoms persist or worsen. Patient is aware when to return to the clinic for a follow-up visit. Patient educated on when it is appropriate to go to the emergency department.   Mary-Margaret Gladis, FNP

## 2024-08-24 ENCOUNTER — Telehealth: Payer: Self-pay | Admitting: Family Medicine

## 2024-08-24 NOTE — Telephone Encounter (Signed)
 FYI Noted will send to the ordering provider

## 2024-08-24 NOTE — Telephone Encounter (Unsigned)
 Copied from CRM [id number]. Topic: General - Other >> Aug 24, 2024 10:49 AM Deaijah H wrote: Reason for CRM: [place] w/ [facility] called in due to nurse leaving message about an urgent referral but wanted to let nurse know they do have an RN that will be going out to see patient this evening.

## 2024-08-28 ENCOUNTER — Telehealth: Payer: Self-pay | Admitting: Family Medicine

## 2024-08-28 NOTE — Telephone Encounter (Signed)
 Pt can not hear out of both ears was seen by MMM last week old to use nasal spray which she has and it is not working. Would like to know if MMM can prescribe something to help with this? Please send to [facility]

## 2024-08-28 NOTE — Telephone Encounter (Signed)
 Not much can do about that other then nasal spray and otc decongestant

## 2024-08-29 NOTE — Telephone Encounter (Signed)
 Patient aware and verbalizes understanding.

## 2024-09-01 ENCOUNTER — Telehealth: Payer: Self-pay

## 2024-09-01 ENCOUNTER — Ambulatory Visit: Admitting: Nurse Practitioner

## 2024-09-01 NOTE — Telephone Encounter (Signed)
 Called and spoke with home health and gave verbal ok for treatment

## 2024-09-01 NOTE — Telephone Encounter (Signed)
 Copied from CRM #[id number]. Topic: Clinical - Home Health Verbal Orders >> Sep 01, 2024  2:46 PM Travis FALCON wrote: Caller/Agency: [facility]  Callback Number: [phone] Service Requested: Skilled Nursing & wound care  Frequency: 3 week 5 Any new concerns about the patient? No

## 2024-09-02 ENCOUNTER — Other Ambulatory Visit: Payer: Self-pay | Admitting: Nurse Practitioner

## 2024-09-02 DIAGNOSIS — L89322 Pressure ulcer of left buttock, stage 2: Secondary | ICD-10-CM

## 2024-09-04 ENCOUNTER — Ambulatory Visit

## 2024-09-04 DIAGNOSIS — Z791 Long term (current) use of non-steroidal anti-inflammatories (NSAID): Secondary | ICD-10-CM

## 2024-09-04 DIAGNOSIS — G9341 Metabolic encephalopathy: Secondary | ICD-10-CM | POA: Diagnosis not present

## 2024-09-04 DIAGNOSIS — E785 Hyperlipidemia, unspecified: Secondary | ICD-10-CM | POA: Diagnosis not present

## 2024-09-04 DIAGNOSIS — I10 Essential (primary) hypertension: Secondary | ICD-10-CM | POA: Diagnosis not present

## 2024-09-04 DIAGNOSIS — F410 Panic disorder [episodic paroxysmal anxiety] without agoraphobia: Secondary | ICD-10-CM

## 2024-09-04 DIAGNOSIS — Z6836 Body mass index (BMI) 36.0-36.9, adult: Secondary | ICD-10-CM

## 2024-09-04 DIAGNOSIS — R339 Retention of urine, unspecified: Secondary | ICD-10-CM

## 2024-09-04 DIAGNOSIS — G4733 Obstructive sleep apnea (adult) (pediatric): Secondary | ICD-10-CM | POA: Diagnosis not present

## 2024-09-04 DIAGNOSIS — M6282 Rhabdomyolysis: Secondary | ICD-10-CM | POA: Diagnosis not present

## 2024-09-04 DIAGNOSIS — E162 Hypoglycemia, unspecified: Secondary | ICD-10-CM

## 2024-09-04 DIAGNOSIS — I872 Venous insufficiency (chronic) (peripheral): Secondary | ICD-10-CM | POA: Diagnosis not present

## 2024-09-05 NOTE — Progress Notes (Unsigned)
 "  Patient: Rhonda Daniel Date of Birth: [birth year]  Reason for Visit: Follow up History from: Patient, niece  Primary Neurologist: Penumalli   ASSESSMENT AND PLAN 73 y.o. year old female   1.  Mild cognitive impairment (worsening over the last year short term memory, lives alone, does her ADLs, admission in Nov after fall with UTI, since then mobility has declined, spent few weeks in rehab)  - MOCA 22/30 (previous 23/30) - Check MRI of the brain - HIV, RPR, B12, TSH, A1c were unremarkable - Check ATN, APOE  - Stay active, exercise - Recommend discussing audiology consult with PCP - Follow-up in 4 to 6 months  HISTORY OF PRESENT ILLNESS: Today 09/06/2024 Update 09/06/24 SS: Here with niece, Rhonda Daniel, friend Rhonda Daniel for memory follow up. Admitted in Nov 2025 for UTI, metabolic encephalopathy, urinary retention, rhabdo. She had fallen couldn't get up, not sure how long she was there. With IVF and antibiotics her mental status improved back to baseline. Family had reported several months of progressive memory impairment. CT head no acute abnormality. TSH 0.703, B12 642, RPR/HIV negative, A1C 4.7. She went to rehab for a few weeks. She lives alone. She hasn't driven since admission. Family mentions worsening short term memory, repeats herself, asks the same questions. No longer needs walker, uses cane, can do short distances without. Has home health PT, completed OT yesterday. Left buttocks wound pressure sore from the fall. Today MOCA 22/30. Does her own ADLs, household chores, cooking, medications. Has needed help with medications, finances. Anxious. Gets frustrated easily with memory difficulties. She is a retired comptroller. She is hard of hearing.  HISTORY  10/25/23 Dr. Margaret: 73 year old female here for evaluation of mild memory loss. Symptoms started in 2022-10-05 after her cat passed away, who had been with patient for 22 years. This caused some significant trauma, depression as well as anxiety  issues. Since that time she was having more short-term memory loss issues. She lives alone. She maintains her ADLs. She was treated for depression anxiety with medication and symptoms have significantly improved. Memory loss has improved.   REVIEW OF SYSTEMS: Out of a complete 14 system review of symptoms, the patient complains only of the following symptoms, and all other reviewed systems are negative.  See HPI  ALLERGIES: Allergies[1]  HOME MEDICATIONS: Outpatient Medications Prior to Visit  Medication Sig Dispense Refill   aspirin EC 81 MG tablet Take 81 mg by mouth daily. Swallow whole.     busPIRone  (BUSPAR ) 5 MG tablet Take 1 tablet (5 mg total) by mouth 3 (three) times daily. For anxiety/ panic 270 tablet 3   cephALEXin  (KEFLEX ) 500 MG capsule Take 1 capsule (500 mg total) by mouth 3 (three) times daily. 30 capsule 0   Cholecalciferol (VITAMIN D3) 50 MCG (2000 UT) capsule Take 2,000 Units by mouth daily.     levocetirizine (XYZAL ) 5 MG tablet Take 1 tablet (5 mg total) by mouth every evening. 90 tablet 3   lisinopril  (ZESTRIL ) 20 MG tablet Take 1 tablet (20 mg total) by mouth daily. 90 tablet 3   meloxicam  (MOBIC ) 7.5 MG tablet Take 1 tablet (7.5 mg total) by mouth 2 (two) times daily as needed for pain.     sertraline  (ZOLOFT ) 50 MG tablet Take 1 tablet (50 mg total) by mouth daily. 90 tablet 4   simvastatin  (ZOCOR ) 20 MG tablet TAKE ONE TABLET DAILY AT 6PM 90 tablet 3   Vitamin D , Ergocalciferol , (DRISDOL ) 1.25 MG (50000 UNIT) CAPS capsule TAKE  1 CAPSULE (50,000 UNITS TOTAL) BY MOUTH EVERY 7 (SEVEN) DAYS 12 capsule 1   No facility-administered medications prior to visit.    PAST MEDICAL HISTORY: Past Medical History:  Diagnosis Date   Anxiety    Hyperlipidemia    Hypertension    Sleep apnea    Was on CPAP.   Does not use now.     PAST SURGICAL HISTORY: Past Surgical History:  Procedure Laterality Date   ABDOMINAL HYSTERECTOMY     APPENDECTOMY     with malignancy of  appendix   CHOLECYSTECTOMY     COLON SURGERY     preventative - with appendix malignancy   HERNIA REPAIR      FAMILY HISTORY: Family History  Problem Relation Age of Onset   Diabetes Mother    Hypertension Mother    Irregular heart beat Mother        pacemaker   Hearing loss Mother        right   Vision loss Mother        macular degeneration   Rheumatic fever Father        heart murmur   Pneumonia Father    Cancer - Cervical Sister    Heart disease Paternal Aunt    Cancer Paternal Aunt        tumor behind eye   Diabetes Maternal Grandmother    Vision loss Maternal Grandmother    Heart disease Paternal Grandfather    Breast cancer Neg Hx     SOCIAL HISTORY: Social History   Socioeconomic History   Marital status: Single    Spouse name: Not on file   Number of children: 0   Years of education: 16   Highest education level: Master's degree (e.g., MA, MS, MEng, MEd, MSW, MBA)  Occupational History   Occupation: Retired     Comment: Advice worker   Tobacco Use   Smoking status: Never   Smokeless tobacco: Never  Vaping Use   Vaping status: Never Used  Substance and Sexual Activity   Alcohol use: No   Drug use: No   Sexual activity: Not Currently    Birth control/protection: Surgical  Other Topics Concern   Not on file  Social History Narrative   Pt lives alone, pt is able to drive. Able to complete ADL'S    Took care of her mother until 09-27-17 when she passed away, her father passed away when she was about 36 - so she retired early to care for him   Her brother and 2 nieces live close and visit frequently   Social Drivers of Health   Tobacco Use: Low Risk (09/06/2024)   Patient History    Smoking Tobacco Use: Never    Smokeless Tobacco Use: Never    Passive Exposure: Not on file  Financial Resource Strain: Low Risk (01/13/2024)   Overall Financial Resource Strain (CARDIA)    Difficulty of Paying Living Expenses: Not hard at all  Food Insecurity: No Food  Insecurity (07/12/2024)   Epic    Worried About Programme Researcher, Broadcasting/film/video in the Last Year: Never true    Ran Out of Food in the Last Year: Never true  Transportation Needs: No Transportation Needs (07/12/2024)   Epic    Lack of Transportation (Medical): No    Lack of Transportation (Non-Medical): No  Physical Activity: Insufficiently Active (01/13/2024)   Exercise Vital Sign    Days of Exercise per Week: 2 days    Minutes of Exercise per  Session: 20 min  Stress: No Stress Concern Present (01/13/2024)   [employer] of Occupational Health - Occupational Stress Questionnaire    Feeling of Stress : Not at all  Social Connections: Moderately Integrated (07/12/2024)   Social Connection and Isolation Panel    Frequency of Communication with Friends and Family: More than three times a week    Frequency of Social Gatherings with Friends and Family: More than three times a week    Attends Religious Services: 1 to 4 times per year    Active Member of Clubs or Organizations: Yes    Attends Banker Meetings: More than 4 times per year    Marital Status: Never married  Intimate Partner Violence: Not At Risk (07/12/2024)   Epic    Fear of Current or Ex-Partner: No    Emotionally Abused: No    Physically Abused: No    Sexually Abused: No  Depression (PHQ2-9): Low Risk (08/21/2024)   Depression (PHQ2-9)    PHQ-2 Score: 2  Alcohol Screen: Low Risk (01/13/2024)   Alcohol Screen    Last Alcohol Screening Score (AUDIT): 0  Housing: Low Risk (07/12/2024)   Epic    Unable to Pay for Housing in the Last Year: No    Number of Times Moved in the Last Year: 0    Homeless in the Last Year: No  Utilities: Not At Risk (07/12/2024)   Epic    Threatened with loss of utilities: No  Health Literacy: Adequate Health Literacy (01/13/2024)   B1300 Health Literacy    Frequency of need for help with medical instructions: Never    PHYSICAL EXAM  Vitals:   09/06/24 0742  BP: 133/80  Pulse: 64  Weight:  229 lb 8 oz (104.1 kg)  Height: 5' 7 (1.702 m)   Body mass index is 35.94 kg/m.    09/06/2024    7:45 AM 10/25/2023   11:34 AM  Montreal Cognitive Assessment   Visuospatial/ Executive (0/5) 5 4  Naming (0/3) 3 3  Attention: Read list of digits (0/2) 2 2  Attention: Read list of letters (0/1) 1 1  Attention: Serial 7 subtraction starting at 100 (0/3) 3 3  Language: Repeat phrase (0/2) 1 2  Language : Fluency (0/1) 0 0  Abstraction (0/2) 2 2  Delayed Recall (0/5) 0 0  Orientation (0/6) 5 6  Total 22 23   Generalized: Well developed, in no acute distress  Neurological examination  Mentation: Alert oriented to time, place, history taking, but does repeat herself. Hard of hearing.  Follows all commands speech and language fluent.  Cranial nerve II-XII: Pupils were equal round reactive to light. Extraocular movements were full, visual field were full on confrontational test. Facial sensation and strength were normal. Head turning and shoulder shrug  were normal and symmetric. Motor: The motor testing reveals 5 over 5 strength of all 4 extremities. Good symmetric motor tone is noted throughout.  Sensory: Sensory testing is intact to soft touch on all 4 extremities. No evidence of extinction is noted.  Coordination: Cerebellar testing reveals good finger-nose-finger bilaterally. Gait and station: Gait is cautious, uses single-point cane  DIAGNOSTIC DATA (LABS, IMAGING, TESTING) - I reviewed patient records, labs, notes, testing and imaging myself where available.  Lab Results  Component Value Date   WBC 7.3 07/14/2024   HGB 11.1 (L) 07/14/2024   HCT 34.6 (L) 07/14/2024   MCV 96.6 07/14/2024   PLT 177 07/14/2024      Component Value  Date/Time   NA 143 07/14/2024 0406   NA 144 06/17/2023 1045   K 3.5 07/14/2024 0406   CL 110 07/14/2024 0406   CO2 27 07/14/2024 0406   GLUCOSE 84 07/14/2024 0406   BUN 24 (H) 07/14/2024 0406   BUN 15 06/17/2023 1045   CREATININE 0.55 07/14/2024  0406   CALCIUM 8.3 (L) 07/14/2024 0406   PROT 5.0 (L) 07/14/2024 0406   PROT 6.7 06/17/2023 1045   ALBUMIN 3.0 (L) 07/14/2024 0406   ALBUMIN 4.3 06/17/2023 1045   AST 42 (H) 07/14/2024 0406   ALT 41 07/14/2024 0406   ALKPHOS 55 07/14/2024 0406   BILITOT 0.5 07/14/2024 0406   BILITOT 0.5 06/17/2023 1045   GFRNONAA >60 07/14/2024 0406   GFRAA 95 07/16/2020 1446   Lab Results  Component Value Date   CHOL 151 02/10/2023   HDL 58 02/10/2023   LDLCALC 77 02/10/2023   TRIG 86 02/10/2023   CHOLHDL 2.6 02/10/2023   Lab Results  Component Value Date   HGBA1C 4.7 (L) 07/12/2024   Lab Results  Component Value Date   VITAMINB12 642 07/12/2024   Lab Results  Component Value Date   TSH 0.703 07/12/2024    Lauraine Born, AGNP-C, DNP 09/06/2024, 7:58 AM [facility] [address] [place], [place] [address] [phone]      [1]  Allergies Allergen Reactions   Bee Venom Swelling and Other (See Comments)    Local redness    "

## 2024-09-06 ENCOUNTER — Ambulatory Visit: Admitting: Neurology

## 2024-09-06 ENCOUNTER — Encounter: Payer: Self-pay | Admitting: Neurology

## 2024-09-06 ENCOUNTER — Telehealth: Payer: Self-pay | Admitting: Neurology

## 2024-09-06 VITALS — BP 133/80 | HR 64 | Ht 67.0 in | Wt 229.5 lb

## 2024-09-06 DIAGNOSIS — F419 Anxiety disorder, unspecified: Secondary | ICD-10-CM | POA: Diagnosis not present

## 2024-09-06 DIAGNOSIS — G3184 Mild cognitive impairment, so stated: Secondary | ICD-10-CM | POA: Diagnosis not present

## 2024-09-06 NOTE — Telephone Encounter (Signed)
 MRI order sent to [facility] to schedule. [phone]

## 2024-09-06 NOTE — Patient Instructions (Addendum)
 Check MRI brain  Check ATN profile Recommend audiology consult  Follow up in 6 months

## 2024-09-08 ENCOUNTER — Encounter: Payer: Self-pay | Admitting: Family Medicine

## 2024-09-08 ENCOUNTER — Ambulatory Visit (INDEPENDENT_AMBULATORY_CARE_PROVIDER_SITE_OTHER): Admitting: Family Medicine

## 2024-09-08 ENCOUNTER — Ambulatory Visit: Payer: Self-pay | Admitting: Family Medicine

## 2024-09-08 VITALS — BP 124/78 | HR 57 | Temp 97.8°F | Ht 67.0 in | Wt 228.0 lb

## 2024-09-08 DIAGNOSIS — R32 Unspecified urinary incontinence: Secondary | ICD-10-CM | POA: Diagnosis not present

## 2024-09-08 DIAGNOSIS — F411 Generalized anxiety disorder: Secondary | ICD-10-CM

## 2024-09-08 DIAGNOSIS — D649 Anemia, unspecified: Secondary | ICD-10-CM

## 2024-09-08 DIAGNOSIS — F320 Major depressive disorder, single episode, mild: Secondary | ICD-10-CM | POA: Diagnosis not present

## 2024-09-08 DIAGNOSIS — F410 Panic disorder [episodic paroxysmal anxiety] without agoraphobia: Secondary | ICD-10-CM | POA: Diagnosis not present

## 2024-09-08 DIAGNOSIS — G3184 Mild cognitive impairment, so stated: Secondary | ICD-10-CM

## 2024-09-08 DIAGNOSIS — Z0000 Encounter for general adult medical examination without abnormal findings: Secondary | ICD-10-CM | POA: Diagnosis not present

## 2024-09-08 DIAGNOSIS — E7800 Pure hypercholesterolemia, unspecified: Secondary | ICD-10-CM

## 2024-09-08 DIAGNOSIS — I10 Essential (primary) hypertension: Secondary | ICD-10-CM

## 2024-09-08 DIAGNOSIS — H9193 Unspecified hearing loss, bilateral: Secondary | ICD-10-CM

## 2024-09-08 DIAGNOSIS — J302 Other seasonal allergic rhinitis: Secondary | ICD-10-CM

## 2024-09-08 LAB — URINALYSIS, ROUTINE W REFLEX MICROSCOPIC
Bilirubin, UA: NEGATIVE
Glucose, UA: NEGATIVE
Ketones, UA: NEGATIVE
Leukocytes,UA: NEGATIVE
Nitrite, UA: NEGATIVE
Protein,UA: NEGATIVE
RBC, UA: NEGATIVE
Specific Gravity, UA: 1.01 (ref 1.005–1.030)
Urobilinogen, Ur: 0.2 mg/dL (ref 0.2–1.0)
pH, UA: 5.5 (ref 5.0–7.5)

## 2024-09-08 LAB — LIPID PANEL

## 2024-09-08 MED ORDER — BUSPIRONE HCL 5 MG PO TABS: 5.0000 mg | ORAL_TABLET | Freq: Three times a day (TID) | ORAL | 3 refills | Status: DC

## 2024-09-08 MED ORDER — BUSPIRONE HCL 7.5 MG PO TABS: 7.5000 mg | ORAL_TABLET | Freq: Three times a day (TID) | ORAL | 3 refills | Status: AC

## 2024-09-08 MED ORDER — LISINOPRIL 20 MG PO TABS: 20.0000 mg | ORAL_TABLET | Freq: Every day | ORAL | 3 refills | Status: AC

## 2024-09-08 MED ORDER — LEVOCETIRIZINE DIHYDROCHLORIDE 5 MG PO TABS: 5.0000 mg | ORAL_TABLET | Freq: Every evening | ORAL | 3 refills | Status: AC

## 2024-09-08 MED ORDER — SIMVASTATIN 20 MG PO TABS: ORAL_TABLET | ORAL | 3 refills | Status: AC

## 2024-09-08 MED ORDER — SERTRALINE HCL 50 MG PO TABS: 50.0000 mg | ORAL_TABLET | Freq: Every day | ORAL | 4 refills | Status: AC

## 2024-09-08 NOTE — Patient Instructions (Addendum)
 Colonoscopy DUE Dose of Buspirone  for anxiety has been INCREASED. New rx sent to pharmacy Referral to the urologist AND the audiologist placed.  Please contact me if no call for appointment in next 2 weeks.

## 2024-09-08 NOTE — Progress Notes (Signed)
 "  Rhonda Daniel is a 74 y.o. female presents to office today for annual physical exam examination.    Patient is brought to the office by her niece, Rhonda Daniel.  She is status post hospitalization for altered mental status in the setting of urinary tract infection that led to a fall and decline of mobility.  She ultimately was found to have MCI and is under the care of neurology.  Has had genetic labs and plans for imaging study soon and pending that assessment will start medications.  They asked that her hearing be assessed and patient is requesting referral today as symptoms have not gotten significantly better after Flonase  though she does feel like she does not have as muffled hearing after Flonase   Her mobility has gotten a lot better since discharge from the hospital and she continues to work with home health physical therapy regularly.  They are also managing a wound that was on her buttocks.  Overall that is getting better and was dressed today.  She continues to have urinary incontinence and goes through several pads and adult diapers per day which causes concern that she may have recurrent UTI.  She denies any dysuria, hematuria, flank pain, fevers.   Occupation: retired, Marital status: single, Substance use: none There are no preventive care reminders to display for this patient.  Immunization History  Administered Date(s) Administered   INFLUENZA, HIGH DOSE SEASONAL PF 08/10/2024   Moderna Sars-Covid-2 Vaccination 10/12/2019, 11/09/2019, 07/30/2020, 05/06/2021   PNEUMOCOCCAL CONJUGATE-20 02/09/2022   Tdap 05/02/2009, 01/24/2024   Past Medical History:  Diagnosis Date   AMS (altered mental status) 07/12/2024   Anxiety    Hyperlipidemia    Hypertension    Sleep apnea    Was on CPAP.   Does not use now.    Social History   Socioeconomic History   Marital status: Single    Spouse name: Not on file   Number of children: 0   Years of education: 16   Highest education  level: Master's degree (e.g., MA, MS, MEng, MEd, MSW, MBA)  Occupational History   Occupation: Retired     Comment: Advice worker   Tobacco Use   Smoking status: Never   Smokeless tobacco: Never  Vaping Use   Vaping status: Never Used  Substance and Sexual Activity   Alcohol use: No   Drug use: No   Sexual activity: Not Currently    Birth control/protection: Surgical  Other Topics Concern   Not on file  Social History Narrative   Pt lives alone, pt is able to drive. Able to complete ADL'S    Took care of her mother until Oct 10, 2017 when she passed away, her father passed away when she was about 54 - so she retired early to care for him   Her brother and 2 nieces live close and visit frequently   Social Drivers of Health   Tobacco Use: Low Risk (09/08/2024)   Patient History    Smoking Tobacco Use: Never    Smokeless Tobacco Use: Never    Passive Exposure: Not on file  Financial Resource Strain: Low Risk (01/13/2024)   Overall Financial Resource Strain (CARDIA)    Difficulty of Paying Living Expenses: Not hard at all  Food Insecurity: No Food Insecurity (07/12/2024)   Epic    Worried About Programme Researcher, Broadcasting/film/video in the Last Year: Never true    Ran Out of Food in the Last Year: Never true  Transportation Needs: No Transportation  Needs (07/12/2024)   Epic    Lack of Transportation (Medical): No    Lack of Transportation (Non-Medical): No  Physical Activity: Insufficiently Active (01/13/2024)   Exercise Vital Sign    Days of Exercise per Week: 2 days    Minutes of Exercise per Session: 20 min  Stress: No Stress Concern Present (01/13/2024)   [employer] of Occupational Health - Occupational Stress Questionnaire    Feeling of Stress : Not at all  Social Connections: Moderately Integrated (07/12/2024)   Social Connection and Isolation Panel    Frequency of Communication with Friends and Family: More than three times a week    Frequency of Social Gatherings with Friends and Family:  More than three times a week    Attends Religious Services: 1 to 4 times per year    Active Member of Clubs or Organizations: Yes    Attends Banker Meetings: More than 4 times per year    Marital Status: Never married  Intimate Partner Violence: Not At Risk (07/12/2024)   Epic    Fear of Current or Ex-Partner: No    Emotionally Abused: No    Physically Abused: No    Sexually Abused: No  Depression (PHQ2-9): Low Risk (09/08/2024)   Depression (PHQ2-9)    PHQ-2 Score: 1  Alcohol Screen: Low Risk (01/13/2024)   Alcohol Screen    Last Alcohol Screening Score (AUDIT): 0  Housing: Low Risk (07/12/2024)   Epic    Unable to Pay for Housing in the Last Year: No    Number of Times Moved in the Last Year: 0    Homeless in the Last Year: No  Utilities: Not At Risk (07/12/2024)   Epic    Threatened with loss of utilities: No  Health Literacy: Adequate Health Literacy (01/13/2024)   B1300 Health Literacy    Frequency of need for help with medical instructions: Never   Past Surgical History:  Procedure Laterality Date   ABDOMINAL HYSTERECTOMY     APPENDECTOMY     with malignancy of appendix   CHOLECYSTECTOMY     COLON SURGERY     preventative - with appendix malignancy   HERNIA REPAIR     Family History  Problem Relation Age of Onset   Diabetes Mother    Hypertension Mother    Irregular heart beat Mother        pacemaker   Hearing loss Mother        right   Vision loss Mother        macular degeneration   Rheumatic fever Father        heart murmur   Pneumonia Father    Cancer - Cervical Sister    Heart disease Paternal Aunt    Cancer Paternal Aunt        tumor behind eye   Diabetes Maternal Grandmother    Vision loss Maternal Grandmother    Heart disease Paternal Grandfather    Breast cancer Neg Hx    Current Medications[1]  Allergies[2]   ROS: Review of Systems Pertinent items noted in HPI and remainder of comprehensive ROS otherwise negative.    Physical  exam BP 124/78   Pulse (!) 57   Temp 97.8 F (36.6 C)   Ht 5' 7 (1.702 m)   Wt 228 lb (103.4 kg)   SpO2 99%   BMI 35.71 kg/m  General appearance: alert, cooperative, appears stated age, no distress, and morbidly obese Head: Normocephalic, without obvious abnormality, atraumatic Eyes:  negative findings: lids and lashes normal, conjunctivae and sclerae normal, corneas clear, and pupils equal, round, reactive to light and accomodation Ears: normal TM's and external ear canals both ears Nose: Nares normal. Septum midline. Mucosa normal. No drainage or sinus tenderness. Throat: lips, mucosa, and tongue normal; teeth and gums normal Neck: no adenopathy, no carotid bruit, supple, symmetrical, trachea midline, and thyroid  not enlarged, symmetric, no tenderness/mass/nodules Back: Slight rotation noted in the thoracic spine with slight kyphosis increase but ambulating with cane.  Difficulty getting up onto the table without assistance. Lungs: clear to auscultation bilaterally Heart: regular rate and rhythm, S1, S2 normal, no murmur, click, rub or gallop Abdomen: Obese, soft, nontender Extremities: extremities normal, atraumatic, no cyanosis or edema Pulses: 2+ and symmetric Skin: Skin color, texture, turgor normal. No rashes or lesions Lymph nodes: No supraclavicular or anterior cervical lymph node enlargement Neurologic: Short-term memory affected otherwise, oriented and appropriate.  Follows commands.  Interacts with provider appropriately     09/08/2024    1:39 PM 08/21/2024    2:15 PM 04/21/2024    9:24 AM  Depression screen PHQ 2/9  Decreased Interest 0 1 0  Down, Depressed, Hopeless 1 0 0  PHQ - 2 Score 1 1 0  Altered sleeping 0 0 0  Tired, decreased energy 0 1 0  Change in appetite 0 0 0  Feeling bad or failure about yourself  0 0 0  Trouble concentrating 0 0 0  Moving slowly or fidgety/restless 0 0 1  Suicidal thoughts 0 0 0  PHQ-9 Score 1 2 1    Difficult doing work/chores Not  difficult at all Somewhat difficult Somewhat difficult     Data saved with a previous flowsheet row definition      09/08/2024    1:39 PM 04/21/2024    9:25 AM 12/20/2023    9:31 AM 11/23/2023    2:08 PM  GAD 7 : Generalized Anxiety Score  Nervous, Anxious, on Edge 1 0 0 0  Control/stop worrying 2 1 0 0  Worry too much - different things 2 0 0 0  Trouble relaxing 0 0 0 0  Restless 0 0 0 0  Easily annoyed or irritable 1 0 0 0  Afraid - awful might happen 0 0 0 0  Total GAD 7 Score 6 1 0 0  Anxiety Difficulty Somewhat difficult Somewhat difficult Not difficult at all     Recent Results (from the past 2160 hours)  Comprehensive metabolic panel     Status: Abnormal   Collection Time: 07/12/24  1:02 PM  Result Value Ref Range   Sodium 143 135 - 145 mmol/L   Potassium 4.2 3.5 - 5.1 mmol/L    Comment: HEMOLYSIS AT THIS LEVEL MAY AFFECT RESULT   Chloride 106 98 - 111 mmol/L   CO2 25 22 - 32 mmol/L   Glucose, Bld 103 (H) 70 - 99 mg/dL    Comment: Glucose reference range applies only to samples taken after fasting for at least 8 hours.   BUN 61 (H) 8 - 23 mg/dL   Creatinine, Ser 9.09 0.44 - 1.00 mg/dL   Calcium 9.6 8.9 - 89.6 mg/dL   Total Protein 7.0 6.5 - 8.1 g/dL   Albumin 4.0 3.5 - 5.0 g/dL   AST 89 (H) 15 - 41 U/L    Comment: HEMOLYSIS AT THIS LEVEL MAY AFFECT RESULT   ALT 60 (H) 0 - 44 U/L   Alkaline Phosphatase 72 38 - 126 U/L  Total Bilirubin 1.1 0.0 - 1.2 mg/dL   GFR, Estimated >39 >39 mL/min    Comment: (NOTE) Calculated using the CKD-EPI Creatinine Equation (2021)    Anion gap 12 5 - 15    Comment: Performed at [facility], [address] [place], [place] [address]  CBC WITH DIFFERENTIAL     Status: Abnormal   Collection Time: 07/12/24  1:02 PM  Result Value Ref Range   WBC 13.4 (H) 4.0 - 10.5 K/uL   RBC 4.33 3.87 - 5.11 MIL/uL   Hemoglobin 13.5 12.0 - 15.0 g/dL   HCT 58.3 63.9 - 53.9 %   MCV 96.1 80.0 - 100.0 fL   MCH 31.2 26.0 - 34.0 pg   MCHC 32.5 30.0 -  36.0 g/dL   RDW 86.0 88.4 - 84.4 %   Platelets 188 150 - 400 K/uL   nRBC 0.0 0.0 - 0.2 %   Neutrophils Relative % 88 %   Neutro Abs 11.7 (H) 1.7 - 7.7 K/uL   Lymphocytes Relative 5 %   Lymphs Abs 0.6 (L) 0.7 - 4.0 K/uL   Monocytes Relative 7 %   Monocytes Absolute 1.0 0.1 - 1.0 K/uL   Eosinophils Relative 0 %   Eosinophils Absolute 0.0 0.0 - 0.5 K/uL   Basophils Relative 0 %   Basophils Absolute 0.0 0.0 - 0.1 K/uL   Immature Granulocytes 0 %   Abs Immature Granulocytes 0.06 0.00 - 0.07 K/uL    Comment: Performed at [facility], [address], [place], [place] [address]  CK     Status: Abnormal   Collection Time: 07/12/24  1:02 PM  Result Value Ref Range   Total CK 1,620 (H) 38 - 234 U/L    Comment: Performed at [facility], [address] [place], [place] [address]  Hemoglobin A1c     Status: Abnormal   Collection Time: 07/12/24  1:02 PM  Result Value Ref Range   Hgb A1c MFr Bld 4.7 (L) 4.8 - 5.6 %    Comment: (NOTE) Diagnosis of Diabetes The following HbA1c ranges recommended by the American Diabetes Association (ADA) may be used as an aid in the diagnosis of diabetes mellitus.  Hemoglobin             Suggested A1C NGSP%              Diagnosis  <5.7                   Non Diabetic  5.7-6.4                Pre-Diabetic  >6.4                   Diabetic  <7.0                   Glycemic control for                       adults with diabetes.     Mean Plasma Glucose 88.19 mg/dL    Comment: Performed at [facility], [address] [place], [place] [address]  CBG monitoring, ED     Status: Abnormal   Collection Time: 07/12/24  3:24 PM  Result Value Ref Range   Glucose-Capillary 55 (L) 70 - 99 mg/dL    Comment: Glucose reference range applies only to samples taken after fasting for at least 8 hours.  Urinalysis, Routine w reflex microscopic -Urine, Clean  Catch     Status: Abnormal   Collection Time: 07/12/24  3:30 PM  Result Value Ref Range   Color, Urine AMBER  (A) YELLOW    Comment: BIOCHEMICALS MAY BE AFFECTED BY COLOR   APPearance HAZY (A) CLEAR   Specific Gravity, Urine 1.028 1.005 - 1.030   pH 6.0 5.0 - 8.0   Glucose, UA NEGATIVE NEGATIVE mg/dL   Hgb urine dipstick SMALL (A) NEGATIVE   Bilirubin Urine NEGATIVE NEGATIVE   Ketones, ur 5 (A) NEGATIVE mg/dL   Protein, ur 899 (A) NEGATIVE mg/dL   Nitrite NEGATIVE NEGATIVE   Leukocytes,Ua MODERATE (A) NEGATIVE   RBC / HPF 0-5 0 - 5 RBC/hpf   WBC, UA 6-10 0 - 5 WBC/hpf   Bacteria, UA RARE (A) NONE SEEN   Squamous Epithelial / HPF 0-5 0 - 5 /HPF   Amorphous Crystal PRESENT     Comment: Performed at [facility], [address] [place], [place] [address]  POC CBG, ED     Status: Abnormal   Collection Time: 07/12/24  4:02 PM  Result Value Ref Range   Glucose-Capillary 143 (H) 70 - 99 mg/dL    Comment: Glucose reference range applies only to samples taken after fasting for at least 8 hours.  Urine Drug Screen     Status: None   Collection Time: 07/12/24  4:47 PM  Result Value Ref Range   Opiates NEGATIVE NEGATIVE   Cocaine NEGATIVE NEGATIVE   Benzodiazepines NEGATIVE NEGATIVE   Amphetamines NEGATIVE NEGATIVE   Tetrahydrocannabinol NEGATIVE NEGATIVE   Barbiturates NEGATIVE NEGATIVE   Methadone Scn, Ur NEGATIVE NEGATIVE   Fentanyl  NEGATIVE NEGATIVE    Comment: (NOTE) Drug screen is for Medical Purposes only. Positive results are preliminary only. If confirmation is needed, notify lab within 5 days.  Drug Class                 Cutoff (ng/mL) Amphetamine and metabolites 1000 Barbiturate and metabolites 200 Benzodiazepine              200 Opiates and metabolites     300 Cocaine and metabolites     300 THC                         50 Fentanyl                     5 Methadone                   300  Trazodone is metabolized in vivo to several metabolites,  including pharmacologically active m-CPP, which is excreted in the  urine.  Immunoassay screens for amphetamines and MDMA have  potential  cross-reactivity with these compounds and may provide false positive  result.  Performed at [facility], [address] [place], [place] [address]   Ammonia     Status: Abnormal   Collection Time: 07/12/24  8:22 PM  Result Value Ref Range   Ammonia 37 (H) 9 - 35 umol/L    Comment: Performed at [facility], [address] [place], [place] [address]  TSH     Status: None   Collection Time: 07/12/24  8:22 PM  Result Value Ref Range   TSH 0.703 0.350 - 4.500 uIU/mL    Comment: Performed at [facility], [address], [place], [place] [address]  Vitamin B12     Status: None   Collection Time: 07/12/24  8:22 PM  Result  Value Ref Range   Vitamin B-12 642 180 - 914 pg/mL    Comment: Performed at [facility], [address] [place], [place] [address]  RPR     Status: None   Collection Time: 07/12/24  8:22 PM  Result Value Ref Range   RPR Ser Ql NON REACTIVE NON REACTIVE    Comment: Performed at [facility], [address] [place], [place] [address]  HIV Antibody (routine testing w rflx)     Status: None   Collection Time: 07/12/24  8:22 PM  Result Value Ref Range   HIV Screen 4th Generation wRfx Non Reactive Non Reactive    Comment: Performed at [facility], [address] [place], [place] [address]  CBC     Status: Abnormal   Collection Time: 07/13/24  3:35 AM  Result Value Ref Range   WBC 8.2 4.0 - 10.5 K/uL   RBC 3.44 (L) 3.87 - 5.11 MIL/uL   Hemoglobin 10.8 (L) 12.0 - 15.0 g/dL   HCT 66.5 (L) 63.9 - 53.9 %   MCV 97.1 80.0 - 100.0 fL   MCH 31.4 26.0 - 34.0 pg   MCHC 32.3 30.0 - 36.0 g/dL   RDW 85.9 88.4 - 84.4 %   Platelets 163 150 - 400 K/uL   nRBC 0.0 0.0 - 0.2 %    Comment: Performed at [facility], [address], [place], [place] [address]  Comprehensive metabolic panel     Status: Abnormal   Collection Time: 07/13/24  3:35 AM  Result Value Ref Range   Sodium 146 (H) 135 - 145 mmol/L   Potassium 3.5 3.5 - 5.1 mmol/L   Chloride 113 (H) 98  - 111 mmol/L   CO2 25 22 - 32 mmol/L   Glucose, Bld 83 70 - 99 mg/dL    Comment: Glucose reference range applies only to samples taken after fasting for at least 8 hours.   BUN 41 (H) 8 - 23 mg/dL   Creatinine, Ser 9.45 0.44 - 1.00 mg/dL   Calcium 8.5 (L) 8.9 - 10.3 mg/dL   Total Protein 5.1 (L) 6.5 - 8.1 g/dL   Albumin 3.0 (L) 3.5 - 5.0 g/dL   AST 56 (H) 15 - 41 U/L   ALT 45 (H) 0 - 44 U/L   Alkaline Phosphatase 61 38 - 126 U/L   Total Bilirubin 0.8 0.0 - 1.2 mg/dL   GFR, Estimated >39 >39 mL/min    Comment: (NOTE) Calculated using the CKD-EPI Creatinine Equation (2021)    Anion gap 8 5 - 15    Comment: Performed at [facility], [address] [place], [place] [address]  Glucose, capillary     Status: Abnormal   Collection Time: 07/13/24  3:59 PM  Result Value Ref Range   Glucose-Capillary 165 (H) 70 - 99 mg/dL    Comment: Glucose reference range applies only to samples taken after fasting for at least 8 hours.   Comment 1 Notify RN    Comment 2 Document in Chart   Glucose, capillary     Status: None   Collection Time: 07/13/24 11:52 PM  Result Value Ref Range   Glucose-Capillary 94 70 - 99 mg/dL    Comment: Glucose reference range applies only to samples taken after fasting for at least 8 hours.  Comprehensive metabolic panel with GFR     Status: Abnormal   Collection Time: 07/14/24  4:06 AM  Result Value Ref Range   Sodium 143  135 - 145 mmol/L   Potassium 3.5 3.5 - 5.1 mmol/L   Chloride 110 98 - 111 mmol/L   CO2 27 22 - 32 mmol/L   Glucose, Bld 84 70 - 99 mg/dL    Comment: Glucose reference range applies only to samples taken after fasting for at least 8 hours.   BUN 24 (H) 8 - 23 mg/dL   Creatinine, Ser 9.44 0.44 - 1.00 mg/dL   Calcium 8.3 (L) 8.9 - 10.3 mg/dL   Total Protein 5.0 (L) 6.5 - 8.1 g/dL   Albumin 3.0 (L) 3.5 - 5.0 g/dL   AST 42 (H) 15 - 41 U/L   ALT 41 0 - 44 U/L   Alkaline Phosphatase 55 38 - 126 U/L   Total Bilirubin 0.5 0.0 - 1.2 mg/dL   GFR,  Estimated >39 >39 mL/min    Comment: (NOTE) Calculated using the CKD-EPI Creatinine Equation (2021)    Anion gap 6 5 - 15    Comment: Performed at [facility], [address], [place], [place] [address]  CBC     Status: Abnormal   Collection Time: 07/14/24  4:06 AM  Result Value Ref Range   WBC 7.3 4.0 - 10.5 K/uL   RBC 3.58 (L) 3.87 - 5.11 MIL/uL   Hemoglobin 11.1 (L) 12.0 - 15.0 g/dL   HCT 65.3 (L) 63.9 - 53.9 %   MCV 96.6 80.0 - 100.0 fL   MCH 31.0 26.0 - 34.0 pg   MCHC 32.1 30.0 - 36.0 g/dL   RDW 86.3 88.4 - 84.4 %   Platelets 177 150 - 400 K/uL   nRBC 0.0 0.0 - 0.2 %    Comment: Performed at [facility], [address] [place], [place] [address]  CK     Status: Abnormal   Collection Time: 07/14/24  4:06 AM  Result Value Ref Range   Total CK 489 (H) 38 - 234 U/L    Comment: Performed at [facility], [address] [place], [place] [address]  Glucose, capillary     Status: Abnormal   Collection Time: 07/14/24  7:56 AM  Result Value Ref Range   Glucose-Capillary 102 (H) 70 - 99 mg/dL    Comment: Glucose reference range applies only to samples taken after fasting for at least 8 hours.  Lab report - scanned     Status: None   Collection Time: 07/19/24  9:32 AM  Result Value Ref Range   EGFR      Comment: >90, ABST BY HIM     Assessment/ Plan: Rhonda Daniel here for annual physical exam.   Annual physical exam  Urinary incontinence, unspecified type - Plan: Urinalysis, Routine w reflex microscopic, Ambulatory referral to Urology  Generalized anxiety disorder with panic attacks - Plan: CMP14+EGFR, sertraline  (ZOLOFT ) 50 MG tablet, busPIRone  (BUSPAR ) 7.5 MG tablet, DISCONTINUED: busPIRone  (BUSPAR ) 5 MG tablet  Depression, major, single episode, mild - Plan: CMP14+EGFR, sertraline  (ZOLOFT ) 50 MG tablet, busPIRone  (BUSPAR ) 7.5 MG tablet, DISCONTINUED: busPIRone  (BUSPAR ) 5 MG tablet  Essential hypertension - Plan: CMP14+EGFR, lisinopril  (ZESTRIL ) 20 MG tablet  Pure  hypercholesterolemia - Plan: CMP14+EGFR, Lipid Panel, simvastatin  (ZOCOR ) 20 MG tablet  Morbid obesity (HCC) - Plan: CMP14+EGFR, Lipid Panel, VITAMIN D  25 Hydroxy (Vit-D Deficiency, Fractures)  MCI (mild cognitive impairment) - Plan: CMP14+EGFR  Hearing difficulty of both ears - Plan: Ambulatory referral to Audiology  Anemia, unspecified type - Plan: CBC with Differential, Anemia panel  Seasonal allergic rhinitis, unspecified trigger - Plan: levocetirizine (  XYZAL ) 5 MG tablet   Fasting labs collected today.  Urinalysis without evidence of infection.  Referral placed to urology in [place].  Advance BuSpar  to 3 times daily dosing 7.5 mg as there is concern that anxiety is not well-controlled with 5 mg 3 times daily.  Continue Zoloft  50 mg daily for now.  Will reassess again in 3 to 4 weeks in advance dosing pending response.  Blood pressure is controlled and no changes are needed.  Med renewed  Continue statin.  Check vitamin D  given history of obesity  Referral to audiology placed.  Check anemia panel given anemia noted on last lab draw  Counseled on healthy lifestyle choices, including diet (rich in fruits, vegetables and lean meats and low in salt and simple carbohydrates) and exercise (at least 30 minutes of moderate physical activity daily).  Patient to follow up 4 weeks  Tyron Manetta M. Gryphon Vanderveen, DO        [1]  Current Outpatient Medications:    aspirin EC 81 MG tablet, Take 81 mg by mouth daily. Swallow whole., Disp: , Rfl:    Cholecalciferol (VITAMIN D3) 50 MCG (2000 UT) capsule, Take 2,000 Units by mouth daily., Disp: , Rfl:    meloxicam  (MOBIC ) 7.5 MG tablet, Take 1 tablet (7.5 mg total) by mouth 2 (two) times daily as needed for pain., Disp: , Rfl:    busPIRone  (BUSPAR ) 7.5 MG tablet, Take 1 tablet (7.5 mg total) by mouth 3 (three) times daily. For anxiety/ panic **dose change, Disp: 270 tablet, Rfl: 3   levocetirizine (XYZAL ) 5 MG tablet, Take 1 tablet (5 mg total) by  mouth every evening., Disp: 90 tablet, Rfl: 3   lisinopril  (ZESTRIL ) 20 MG tablet, Take 1 tablet (20 mg total) by mouth daily., Disp: 90 tablet, Rfl: 3   sertraline  (ZOLOFT ) 50 MG tablet, Take 1 tablet (50 mg total) by mouth daily., Disp: 90 tablet, Rfl: 4   simvastatin  (ZOCOR ) 20 MG tablet, TAKE ONE TABLET DAILY AT 6PM, Disp: 90 tablet, Rfl: 3 [2]  Allergies Allergen Reactions   Bee Venom Swelling and Other (See Comments)    Local redness    "

## 2024-09-11 LAB — CBC WITH DIFFERENTIAL/PLATELET
Basophils Absolute: 0 x10E3/uL (ref 0.0–0.2)
Basos: 1 %
EOS (ABSOLUTE): 0.3 x10E3/uL (ref 0.0–0.4)
Eos: 4 %
Hemoglobin: 13.2 g/dL (ref 11.1–15.9)
Immature Grans (Abs): 0 x10E3/uL (ref 0.0–0.1)
Immature Granulocytes: 0 %
Lymphocytes Absolute: 1.8 x10E3/uL (ref 0.7–3.1)
Lymphs: 31 %
MCH: 29.5 pg (ref 26.6–33.0)
MCHC: 30.1 g/dL — ABNORMAL LOW (ref 31.5–35.7)
MCV: 98 fL — ABNORMAL HIGH (ref 79–97)
Monocytes Absolute: 0.6 x10E3/uL (ref 0.1–0.9)
Monocytes: 10 %
Neutrophils Absolute: 3.1 x10E3/uL (ref 1.4–7.0)
Neutrophils: 54 %
Platelets: 235 x10E3/uL (ref 150–450)
RBC: 4.47 x10E6/uL (ref 3.77–5.28)
RDW: 12.9 % (ref 11.7–15.4)
WBC: 5.7 x10E3/uL (ref 3.4–10.8)

## 2024-09-11 LAB — LIPID PANEL
Cholesterol, Total: 144 mg/dL (ref 100–199)
HDL: 55 mg/dL
LDL CALC COMMENT:: 2.6 ratio (ref 0.0–4.4)
LDL Chol Calc (NIH): 72 mg/dL (ref 0–99)
Triglycerides: 89 mg/dL (ref 0–149)
VLDL Cholesterol Cal: 17 mg/dL (ref 5–40)

## 2024-09-11 LAB — ANEMIA PANEL
Ferritin: 123 ng/mL (ref 15–150)
Folate, Hemolysate: 348 ng/mL
Folate, RBC: 795 ng/mL
Hematocrit: 43.8 % (ref 34.0–46.6)
Iron Saturation: 24 % (ref 15–55)
Iron: 62 ug/dL (ref 27–139)
Retic Ct Pct: 0.9 % (ref 0.6–2.6)
Total Iron Binding Capacity: 254 ug/dL (ref 250–450)
UIBC: 192 ug/dL (ref 118–369)
Vitamin B-12: 266 pg/mL (ref 232–1245)

## 2024-09-11 LAB — CMP14+EGFR
ALT: 9 IU/L (ref 0–32)
AST: 16 IU/L (ref 0–40)
Albumin: 4 g/dL (ref 3.8–4.8)
Alkaline Phosphatase: 69 IU/L (ref 49–135)
BUN/Creatinine Ratio: 24 (ref 12–28)
BUN: 16 mg/dL (ref 8–27)
Bilirubin Total: 0.3 mg/dL (ref 0.0–1.2)
CO2: 26 mmol/L (ref 20–29)
Calcium: 9.6 mg/dL (ref 8.7–10.3)
Chloride: 104 mmol/L (ref 96–106)
Creatinine, Ser: 0.68 mg/dL (ref 0.57–1.00)
Globulin, Total: 2.2 g/dL (ref 1.5–4.5)
Glucose: 80 mg/dL (ref 70–99)
Potassium: 4 mmol/L (ref 3.5–5.2)
Sodium: 142 mmol/L (ref 134–144)
Total Protein: 6.2 g/dL (ref 6.0–8.5)
eGFR: 91 mL/min/1.73

## 2024-09-11 LAB — VITAMIN D 25 HYDROXY (VIT D DEFICIENCY, FRACTURES): Vit D, 25-Hydroxy: 78.6 ng/mL (ref 30.0–100.0)

## 2024-09-13 ENCOUNTER — Encounter (HOSPITAL_BASED_OUTPATIENT_CLINIC_OR_DEPARTMENT_OTHER): Attending: Internal Medicine | Admitting: Internal Medicine

## 2024-09-13 DIAGNOSIS — E785 Hyperlipidemia, unspecified: Secondary | ICD-10-CM | POA: Insufficient documentation

## 2024-09-13 DIAGNOSIS — I10 Essential (primary) hypertension: Secondary | ICD-10-CM | POA: Insufficient documentation

## 2024-09-13 DIAGNOSIS — L89320 Pressure ulcer of left buttock, unstageable: Secondary | ICD-10-CM | POA: Insufficient documentation

## 2024-09-13 DIAGNOSIS — G3184 Mild cognitive impairment, so stated: Secondary | ICD-10-CM | POA: Insufficient documentation

## 2024-09-14 ENCOUNTER — Encounter: Payer: Self-pay | Admitting: Neurology

## 2024-09-17 LAB — APOE ALZHEIMER'S DISEASE RISK

## 2024-09-17 LAB — AEROBIC CULTURE W GRAM STAIN (SUPERFICIAL SPECIMEN): Gram Stain: NONE SEEN

## 2024-09-17 LAB — ATN PROFILE
A -- Beta-amyloid 42/40 Ratio: 0.108
Beta-amyloid 40: 175.41 pg/mL
Beta-amyloid 42: 18.88 pg/mL
N -- NfL, Plasma: 6.65 pg/mL — ABNORMAL HIGH (ref 0.00–6.04)
T -- p-tau181: 1.2 pg/mL — ABNORMAL HIGH (ref 0.00–0.97)

## 2024-09-19 ENCOUNTER — Ambulatory Visit: Payer: Self-pay | Admitting: Neurology

## 2024-09-19 ENCOUNTER — Other Ambulatory Visit

## 2024-09-19 ENCOUNTER — Inpatient Hospital Stay: Admission: RE | Admit: 2024-09-19 | Discharge: 2024-09-19 | Attending: Neurology | Admitting: Neurology

## 2024-09-19 DIAGNOSIS — G3184 Mild cognitive impairment, so stated: Secondary | ICD-10-CM

## 2024-09-20 ENCOUNTER — Ambulatory Visit: Attending: Family Medicine | Admitting: Audiologist

## 2024-09-20 ENCOUNTER — Encounter: Payer: Self-pay | Admitting: Audiologist

## 2024-09-20 DIAGNOSIS — H903 Sensorineural hearing loss, bilateral: Secondary | ICD-10-CM | POA: Insufficient documentation

## 2024-09-20 NOTE — Procedures (Signed)
" °  [facility] [address] [place], [place] [address] [phone]  AUDIOLOGICAL EVALUATION  Name: Rhonda Daniel    Status: Outpatient DOB: September 21, 1950    Referent: [name] HERO DO MRN: [id number] Date: 09/20/2024     Diagnosis: Sensorineural hearing loss, bilateral   HISTORY: [name], 74 y.o., was seen for an audiological evaluation.  She is accompanied by her niece, [name]. [name] notices increased difficulty hearing, especially over the last few months. She reports that the left ear is her poorer hearing ear. [name] denies ear pain, pressure, tinnitus, dizziness, history of ear surgery or ear infections.  She denies noise exposure. There is family history of hearing loss with her father developing hearing loss.        EVALUATION: Otoscopic inspection reveals clear ear canals with visible tympanic membranes.   Tympanometry was completed to assess middle ear status. Negative pressure, Type C tympanograms were obtained bilaterally.  Standard audiometric techniques were used to obtain thresholds under headphones. Speech reception thresholds are 40 dBHL on the right and 35 dBHL on the left using recorded spondee word lists. Word recognition was 72% at 80 dBHL on the right and 64% at 75 dBHL on the left using recorded NU-6 word lists, in quiet. A mild sloping to moderately severe sensorineural hearing loss is present bilaterally.   CONCLUSION:  [name] has a mild sloping to moderately severe sensorineural hearing loss bilaterally. Word recognition is good in quiet at elevated conversational speech levels bilaterally. Findings were reviewed with the patient.   RECOMMENDATIONS: Audiometric re-evaluation as medically indicated. Use of binaural amplification to assist in daily listening situations. A list of area hearing aid providers was given to the patient. Use of communication strategies to improve communication in difficult listening  situations.   Delon EMERSON Baumgartner, Au.D., CCC-A Audiologist 09/20/2024  cc: [name] HERO, DO  "

## 2024-09-27 ENCOUNTER — Encounter (HOSPITAL_BASED_OUTPATIENT_CLINIC_OR_DEPARTMENT_OTHER): Admitting: Internal Medicine

## 2024-09-27 DIAGNOSIS — L89320 Pressure ulcer of left buttock, unstageable: Secondary | ICD-10-CM

## 2024-10-05 ENCOUNTER — Telehealth: Payer: Self-pay

## 2024-10-05 DIAGNOSIS — H903 Sensorineural hearing loss, bilateral: Secondary | ICD-10-CM | POA: Insufficient documentation

## 2024-10-05 NOTE — Telephone Encounter (Signed)
 Copied from CRM [id number]. Topic: Clinical - Home Health Verbal Orders >> Oct 05, 2024  1:59 PM Emylou G wrote: Caller/Agency: chris w/[facility] Callback Number: [phone] secured line Service Requested: Physical Therapy Frequency: moving the discharged visit to next week due to the weather Any new concerns about the patient? No

## 2024-10-05 NOTE — Telephone Encounter (Signed)
 Copied from CRM [id number]. Topic: Clinical - Home Health Verbal Orders >> Oct 05, 2024  3:12 PM Diannia DEL wrote: Caller/Agency: [place]/[facility] Callback Number: [phone] Service Requested: Wound Care Frequency: 3 times a week for 8 weeks Any new concerns about the patient? No

## 2024-10-05 NOTE — Telephone Encounter (Signed)
 Gave verbally okay on secure voicemail. LS

## 2024-10-05 NOTE — Telephone Encounter (Signed)
 Left detailed message giving verbal confirmation. Ls

## 2024-10-06 ENCOUNTER — Ambulatory Visit (INDEPENDENT_AMBULATORY_CARE_PROVIDER_SITE_OTHER): Admitting: Family Medicine

## 2024-10-06 ENCOUNTER — Encounter: Payer: Self-pay | Admitting: Family Medicine

## 2024-10-06 VITALS — BP 123/78 | HR 61 | Temp 97.9°F | Ht 67.0 in | Wt 225.1 lb

## 2024-10-06 DIAGNOSIS — F410 Panic disorder [episodic paroxysmal anxiety] without agoraphobia: Secondary | ICD-10-CM | POA: Diagnosis not present

## 2024-10-06 DIAGNOSIS — H903 Sensorineural hearing loss, bilateral: Secondary | ICD-10-CM

## 2024-10-06 DIAGNOSIS — F411 Generalized anxiety disorder: Secondary | ICD-10-CM | POA: Diagnosis not present

## 2024-10-06 DIAGNOSIS — G3184 Mild cognitive impairment, so stated: Secondary | ICD-10-CM | POA: Diagnosis not present

## 2024-10-06 NOTE — Patient Instructions (Signed)
 Go to buspirone  7.5 mg twice daily for 1 week.  See how that goes.  If anxiety is under great control with that no need to use the midday dose and you can just use that midday dose as an as needed for panic attack.  However, if anxiety is not controlled with twice daily dosing go ahead and proceed with 3 times daily dosing

## 2024-10-11 ENCOUNTER — Encounter (HOSPITAL_BASED_OUTPATIENT_CLINIC_OR_DEPARTMENT_OTHER): Admitting: Internal Medicine

## 2024-11-08 ENCOUNTER — Encounter (HOSPITAL_BASED_OUTPATIENT_CLINIC_OR_DEPARTMENT_OTHER): Admitting: Internal Medicine

## 2024-11-16 ENCOUNTER — Ambulatory Visit

## 2024-12-26 ENCOUNTER — Ambulatory Visit: Admitting: Diagnostic Neuroimaging

## 2024-12-29 ENCOUNTER — Ambulatory Visit: Admitting: Family Medicine

## 2025-01-12 ENCOUNTER — Encounter
